# Patient Record
Sex: Male | Born: 1970 | Race: Black or African American | Hispanic: No | Marital: Single | Smoking: Never smoker
Health system: Southern US, Community
[De-identification: ages and names within clinical notes are randomized; demographics above are authoritative.]

## PROBLEM LIST (undated history)

## (undated) DIAGNOSIS — G4733 Obstructive sleep apnea (adult) (pediatric): Secondary | ICD-10-CM

## (undated) DIAGNOSIS — E785 Hyperlipidemia, unspecified: Secondary | ICD-10-CM

## (undated) DIAGNOSIS — I10 Essential (primary) hypertension: Secondary | ICD-10-CM

## (undated) DIAGNOSIS — G4730 Sleep apnea, unspecified: Secondary | ICD-10-CM

## (undated) DIAGNOSIS — E668 Other obesity: Secondary | ICD-10-CM

## (undated) DIAGNOSIS — M51369 Other intervertebral disc degeneration, lumbar region without mention of lumbar back pain or lower extremity pain: Secondary | ICD-10-CM

## (undated) DIAGNOSIS — E669 Obesity, unspecified: Secondary | ICD-10-CM

## (undated) DIAGNOSIS — E6689 Other obesity not elsewhere classified: Secondary | ICD-10-CM

## (undated) DIAGNOSIS — F4310 Post-traumatic stress disorder, unspecified: Secondary | ICD-10-CM

## (undated) DIAGNOSIS — F329 Major depressive disorder, single episode, unspecified: Secondary | ICD-10-CM

## (undated) DIAGNOSIS — M5136 Other intervertebral disc degeneration, lumbar region: Secondary | ICD-10-CM

## (undated) DIAGNOSIS — E119 Type 2 diabetes mellitus without complications: Secondary | ICD-10-CM

## (undated) DIAGNOSIS — R7303 Prediabetes: Secondary | ICD-10-CM

## (undated) DIAGNOSIS — F32A Depression, unspecified: Secondary | ICD-10-CM

## (undated) HISTORY — DX: Essential (primary) hypertension: I10

## (undated) HISTORY — DX: Other obesity not elsewhere classified: E66.89

## (undated) HISTORY — DX: Prediabetes: R73.03

## (undated) HISTORY — DX: Other intervertebral disc degeneration, lumbar region without mention of lumbar back pain or lower extremity pain: M51.369

## (undated) HISTORY — DX: Hyperlipidemia, unspecified: E78.5

## (undated) HISTORY — DX: Sleep apnea, unspecified: G47.30

## (undated) HISTORY — DX: Other obesity: E66.8

## (undated) HISTORY — DX: Obstructive sleep apnea (adult) (pediatric): G47.33

## (undated) HISTORY — DX: Major depressive disorder, single episode, unspecified: F32.9

## (undated) HISTORY — DX: Post-traumatic stress disorder, unspecified: F43.10

## (undated) HISTORY — DX: Other intervertebral disc degeneration, lumbar region: M51.36

## (undated) HISTORY — DX: Obesity, unspecified: E66.9

## (undated) HISTORY — DX: Type 2 diabetes mellitus without complications: E11.9

## (undated) HISTORY — DX: Depression, unspecified: F32.A

---

## 2012-11-30 ENCOUNTER — Ambulatory Visit: Payer: Self-pay | Admitting: Dietician

## 2013-01-15 ENCOUNTER — Encounter: Payer: BC Managed Care – HMO | Attending: Family Medicine | Admitting: *Deleted

## 2013-01-15 ENCOUNTER — Encounter: Payer: Self-pay | Admitting: *Deleted

## 2013-01-15 DIAGNOSIS — E669 Obesity, unspecified: Secondary | ICD-10-CM | POA: Insufficient documentation

## 2013-01-15 DIAGNOSIS — Z713 Dietary counseling and surveillance: Secondary | ICD-10-CM | POA: Insufficient documentation

## 2013-01-15 NOTE — Progress Notes (Signed)
Medical Nutrition Therapy:  Appt start time: 0800 end time:  0900.  Assessment:  Primary concern today: Weight management. Patient with obstructive sleep apnea. He reports that he gained about 20 pounds in the last 5 months. This is mainly due to eating a lot of fast food during the work day and excessive snacking on junk food available at work. 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 He often doesn't have a chance to sit down for lunch during the day. He started a 30 day "cleanse", which involves cutting out starches, sugars, dairy, butter, and fried foods. He has also eliminated soda and sweet tea. He reports a weight loss of 17 pounds in the last 2 months. He now brings about 4-5 servings of fruit to work daily, which he snacks on throughout the day. Protein intake appears low. He is also exercising regularly on his off days.   MEDICATIONS: Simvastati   DIETARY INTAKE:   Usual eating pattern includes 2-3 meals and multiple snacks per day.  24-hr recall:  B ( AM): Fruit, egg casserole (vegetables)  Snk ( AM): Fruit, Lara bars  L ( PM): Sometimes if time, Chicken breast, vegetables, fruit Snk ( PM): Same D ( PM): Baked chicken, vegetables Snk ( PM): None Beverages: Water, unsweetened tea  Usual physical activity: Treadmill 4 miles, several times weekly, active job  Estimated energy needs: 1800 calories 225 g carbohydrates 113 g protein 50 g fat  Progress Towards Goal(s):  In progress.   Nutritional Diagnosis:  [place]-3.3 Overweight/obesity As related to history of excessive energy intake and physical inactivity.  As evidenced by BMI 45.1.    Intervention:  Nutrition counseling. We discussed strategies for weight loss, including balancing nutrients (carbs, protein, fat), portion control, healthy snacks, and exercise.   Goals:  1. 1 pound weight loss per week. No goal weight set at this time. 2. Balance nutrients at meals, including a good protein source at  least 3 times daily, non-starchy vegetables, and 1-2 serving of healthy starches at meals.  3. Monitor portion size of foods.  4. Continue walking on the treadmill at least 3 days weekly. Incorporate resistance training 2-3 days weekly.   Handouts given during visit include:  Weight loss tips  Yellow meal plan card  Monitoring/Evaluation:  Dietary intake, exercise, and body weight prn.

## 2013-11-08 ENCOUNTER — Encounter: Payer: Self-pay | Admitting: Emergency Medicine

## 2013-11-08 ENCOUNTER — Emergency Department
Admission: EM | Admit: 2013-11-08 | Discharge: 2013-11-08 | Disposition: A | Discharge status: Home / Self Care | Payer: BC Managed Care – PPO | Source: Home / Self Care | Attending: Family Medicine | Admitting: Family Medicine

## 2013-11-08 DIAGNOSIS — T63453A Toxic effect of venom of hornets, assault, initial encounter: Principal | ICD-10-CM

## 2013-11-08 DIAGNOSIS — T63463A Toxic effect of venom of wasps, assault, initial encounter: Principal | ICD-10-CM

## 2013-11-08 DIAGNOSIS — E1169 Type 2 diabetes mellitus with other specified complication: Secondary | ICD-10-CM | POA: Insufficient documentation

## 2013-11-08 DIAGNOSIS — E785 Hyperlipidemia, unspecified: Secondary | ICD-10-CM | POA: Insufficient documentation

## 2013-11-08 DIAGNOSIS — T6391XA Toxic effect of contact with unspecified venomous animal, accidental (unintentional), initial encounter: Secondary | ICD-10-CM

## 2013-11-08 DIAGNOSIS — T63443A Toxic effect of venom of bees, assault, initial encounter: Secondary | ICD-10-CM

## 2013-11-08 MED ORDER — PREDNISONE 20 MG PO TABS: 20.0000 mg | ORAL_TABLET | Freq: Two times a day (BID) | ORAL | Status: DC

## 2013-11-08 NOTE — ED Notes (Signed)
Christopher [name] was mowing his lawn about a hour ago when he was stung 3 times by yellow jackets on his right leg and left ankle. The area is red, swollen and painful. Denies shortness of breath or swelling in throat.

## 2013-11-08 NOTE — Discharge Instructions (Signed)
Apply cool compress several times daily until resolved.  Take Benadryl 8m every 6hours for one to two days.   Return for worsening symptoms:  Increased pain, swelling, heat, redness, etc.   Bee, Wasp, or Hornet Sting Your caregiver has diagnosed you as having an insect sting. An insect sting appears as a red lump in the skin that sometimes has a tiny hole in the center, or it may have a stinger in the center of the wound. The most common stings are from wasps, hornets and bees. Individuals have different reactions to insect stings.  A normal reaction may cause pain, swelling, and redness around the sting site.  A localized allergic reaction may cause swelling and redness that extends beyond the sting site.  A large local reaction may continue to develop over the next 12 to 36 hours.  On occasion, the reactions can be severe (anaphylactic reaction). An anaphylactic reaction may cause wheezing; difficulty breathing; chest pain; fainting; raised, itchy, red patches on the skin; a sick feeling to your stomach (nausea); vomiting; cramping; or diarrhea. If you have had an anaphylactic reaction to an insect sting in the past, you are more likely to have one again. HOME CARE INSTRUCTIONS   With bee stings, a small sac of poison is left in the wound. Brushing across this with something such as a credit card, or anything similar, will help remove this and decrease the amount of the reaction. This same procedure will not help a wasp sting as they do not leave behind a stinger and poison sac.  Apply a cold compress for 10 to 20 minutes every hour for 1 to 2 days, depending on severity, to reduce swelling and itching.  To lessen pain, a paste made of water and baking soda may be rubbed on the bite or sting and left on for 5 minutes.  To relieve itching and swelling, you may use take medication or apply medicated creams or lotions as directed.  Only take over-the-counter or prescription medicines for  pain, discomfort, or fever as directed by your caregiver.  Wash the sting site daily with soap and water. Apply antibiotic ointment on the sting site as directed.  If you suffered a severe reaction:  If you did not require hospitalization, an adult will need to stay with you for 24 hours in case the symptoms return.  You may need to wear a medical bracelet or necklace stating the allergy.  You and your family need to learn when and how to use an anaphylaxis kit or epinephrine injection.  If you have had a severe reaction before, always carry your anaphylaxis kit with you. SEEK MEDICAL CARE IF:   None of the above helps within 2 to 3 days.  The area becomes red, warm, tender, and swollen beyond the area of the bite or sting.  You have an oral temperature above 102 F (38.9 C). SEEK IMMEDIATE MEDICAL CARE IF:  You have symptoms of an allergic reaction which are:  Wheezing.  Difficulty breathing.  Chest pain.  Lightheadedness or fainting.  Itchy, raised, red patches on the skin.  Nausea, vomiting, cramping or diarrhea. ANY OF THESE SYMPTOMS MAY REPRESENT A SERIOUS PROBLEM THAT IS AN EMERGENCY. Do not wait to see if the symptoms will go away. Get medical help right away. Call your local emergency services (911 in [place] DO NOT drive yourself to the hospital. MAKE SURE YOU:   Understand these instructions.  Will watch your condition.  Will get help right  away if you are not doing well or get worse. Document Released: 04/22/2005 Document Revised: 07/15/2011 Document Reviewed: 10/07/2009 [facility] Patient Information 2015 [place], [place]. This information is not intended to replace advice given to you by your health care provider. Make sure you discuss any questions you have with your health care provider.

## 2013-11-08 NOTE — ED Provider Notes (Signed)
CSN: [id number]     Arrival date & time 11/08/13  1820 History   First MD Initiated Contact with Patient 11/08/13 1856     Chief Complaint  Patient presents with  . Insect Bite      HPI Comments: Patient was mowing his lawn about an hour ago when 3 times by yellow jackets on his right leg and left ankle.  He took 50mg  Benadryl PO.  He denies swelling in tongue/throat, shortness of breath, wheezing, or hives. He states that usually when stung by bees he develops hives.   Patient is a 43 y.o. male presenting with trauma. The history is provided by the patient.  Trauma Mechanism of injury: 3 yellow jack stings Injury location: lower legs. Incident location: home Time since incident: 1 hour Arrived directly from scene: yes   EMS/PTA data:      Loss of consciousness: no  Current symptoms:      Pain quality: burning      Pain timing: constant      Associated symptoms:            Denies abdominal pain, chest pain, difficulty breathing, headache, loss of consciousness, nausea, seizures and vomiting.   Relevant PMH:      Medical risk factors:            No asthma.       Tetanus status: UTD   Past Medical History  Diagnosis Date  . Hyperlipidemia    History reviewed. No pertinent past surgical history. History reviewed. No pertinent family history. History  Substance Use Topics  . Smoking status: Never Smoker   . Smokeless tobacco: Never Used  . Alcohol Use: No    Review of Systems  Cardiovascular: Negative for chest pain.  Gastrointestinal: Negative for nausea, vomiting and abdominal pain.  Neurological: Negative for seizures, loss of consciousness and headaches.  All other systems reviewed and are negative.   Allergies  Review of patient's allergies indicates no known allergies.  Home Medications   Prior to Admission medications   Medication Sig Start Date End Date Taking? Authorizing Provider  simvastatin (ZOCOR) 10 MG tablet Take 10 mg by mouth daily.   Yes  Historical Provider, MD  predniSONE (DELTASONE) 20 MG tablet Take 1 tablet (20 mg total) by mouth 2 (two) times daily. Take with food. 11/08/13   Kandra Nicolas, MD   BP 139/88  Pulse 96  Temp(Src) 98.2 F (36.8 C) (Oral)  Resp 18  Ht 5\' 8"  (1.727 m)  Wt 307 lb (139.254 kg)  BMI 46.69 kg/m2  SpO2 97% Physical Exam  Nursing note and vitals reviewed. Constitutional: He is oriented to person, place, and time. He appears well-developed and well-nourished. No distress.  Patient is obese (BMI 46.7)  HENT:  Head: Atraumatic.  Nose: Nose normal.  Mouth/Throat: Oropharynx is clear and moist.  Eyes: Conjunctivae and EOM are normal. Pupils are equal, round, and reactive to light.  Neck: Neck supple.  Cardiovascular: Normal heart sounds.   Pulmonary/Chest: Breath sounds normal. No stridor.  Abdominal: There is no tenderness.  Lymphadenopathy:    He has no cervical adenopathy.  Neurological: He is alert and oriented to person, place, and time.  Skin: Skin is warm and dry. No rash noted.     Mild tenderness to areas marked in blue on lower extremities.  No erythema or warmth.  Minimal swelling.    ED Course  Procedures  NONE     MDM   1. Sting from  hornet, wasp, or bee, assault, initial encounter    Begin prednisone burst. Apply cool compress several times daily until resolved.  Take Benadryl 50mg  every 6hours for one to two days.   Return for worsening symptoms:  Increased pain, swelling, heat, redness, etc. Followup with Family Doctor if not improved in about 4 days. If symptoms become significantly worse during the night or over the weekend, proceed to the local emergency room.     Kandra Nicolas, MD 11/09/13 [id number]

## 2013-11-09 ENCOUNTER — Encounter: Payer: Self-pay | Admitting: *Deleted

## 2013-11-12 ENCOUNTER — Telehealth: Payer: Self-pay | Admitting: Emergency Medicine

## 2013-11-12 NOTE — ED Notes (Signed)
Inquired about patient's status; encourage them to call with questions/concerns.  

## 2013-12-02 ENCOUNTER — Encounter: Payer: Self-pay | Admitting: Family Medicine

## 2013-12-02 ENCOUNTER — Ambulatory Visit (INDEPENDENT_AMBULATORY_CARE_PROVIDER_SITE_OTHER): Payer: BC Managed Care – PPO | Admitting: Family Medicine

## 2013-12-02 VITALS — BP 129/77 | HR 83 | Ht 67.75 in | Wt 307.0 lb

## 2013-12-02 DIAGNOSIS — G4733 Obstructive sleep apnea (adult) (pediatric): Secondary | ICD-10-CM | POA: Insufficient documentation

## 2013-12-02 DIAGNOSIS — E785 Hyperlipidemia, unspecified: Secondary | ICD-10-CM

## 2013-12-02 DIAGNOSIS — Z9989 Dependence on other enabling machines and devices: Secondary | ICD-10-CM | POA: Insufficient documentation

## 2013-12-02 DIAGNOSIS — E669 Obesity, unspecified: Secondary | ICD-10-CM | POA: Diagnosis not present

## 2013-12-02 DIAGNOSIS — Z8042 Family history of malignant neoplasm of prostate: Secondary | ICD-10-CM | POA: Insufficient documentation

## 2013-12-02 HISTORY — DX: Obstructive sleep apnea (adult) (pediatric): G47.33

## 2013-12-02 NOTE — Progress Notes (Signed)
CC: Christopher [name] is a 43 y.o. male is here for Establish Care   Subjective: HPI:  Very pleasant 43 year old here to establish care  Patient reports a history of hyperlipidemia he's been dealing with for years, been taking Lipitor on a daily basis without known side effects for a little over a year now. He believes that his cholesterol was checked in April and was reportedly normal. No formal exercise routine, he admits difficulty with sticking to a healthy diet. No history of diabetes, nor cardiovascular disease  Person history of obesity that has been worsening over the past, described as moderate in severity. Reports at least 1 pound weight gain every week for matter of months now. She was worsened by activity typically leading to boredom this resolved with eating it into his weight gain. Reports irregular work schedule makes it hard to get into routine formal exercise plan. He's benefit from nutrition referral in the past he wants noted to reestablish with them.  Reports a history of obstructive sleep apnea currently using CPAP machine on nightly basis without fatigue or nonrestorative sleep  Review of Systems - General ROS: negative for - chills, fever, night sweats,  weight loss Ophthalmic ROS: negative for - decreased vision Psychological ROS: negative for - anxiety or depression ENT ROS: negative for - hearing change, nasal congestion, tinnitus or allergies Hematological and Lymphatic ROS: negative for - bleeding problems, bruising or swollen lymph nodes Breast ROS: negative Respiratory ROS: no cough, shortness of breath, or wheezing Cardiovascular ROS: no chest pain or dyspnea on exertion Gastrointestinal ROS: no abdominal pain, change in bowel habits, or black or bloody stools Genito-Urinary ROS: negative for - genital discharge, genital ulcers, incontinence or abnormal bleeding from genitals Musculoskeletal ROS: negative for - joint pain or muscle pain Neurological ROS: negative  for - headaches or memory loss Dermatological ROS: negative for lumps, mole changes, rash and skin lesion changes  Past Medical History  Diagnosis Date  . Hyperlipidemia     History reviewed. No pertinent past surgical history. Family History  Problem Relation Age of Onset  . Prostate cancer      History   Social History  . Marital Status: Single    Spouse Name: N/A    Number of Children: N/A  . Years of Education: N/A   Occupational History  . Not on file.   Social History Main Topics  . Smoking status: Never Smoker   . Smokeless tobacco: Never Used  . Alcohol Use: No  . Drug Use: No  . Sexual Activity: Yes    Partners: Female   Other Topics Concern  . Not on file   Social History Narrative   ** Merged History Encounter **         Objective: BP 129/77  Pulse 83  Ht 5' 7.75" (1.721 m)  Wt 307 lb (139.254 kg)  BMI 47.02 kg/m2  General: Alert and Oriented, No Acute Distress HEENT: Pupils equal, round, reactive to light. Conjunctivae clear.   moist mucous membranes pharynx unremarkable  Lungs: Clear to auscultation bilaterally, no wheezing/ronchi/rales.  Comfortable work of breathing. Good air movement. Cardiac: Regular rate and rhythm. Normal S1/S2.  No murmurs, rubs, nor gallops.  No carotid bruit  Abdomen:  obese and soft  Extremities: No peripheral edema.  Strong peripheral pulses.  Mental Status: No depression, anxiety, nor agitation. Skin: Warm and dry.  Assessment & Plan: Takota was seen today for establish care.  Diagnoses and associated orders for this visit:  OSA (  obstructive sleep apnea)  Obesity - Ambulatory referral to diabetic education  Hyperlipidemia    Hyperlipidemia: I requested outside records in order to review his most recent lipid panel and looking to see if liver enzymes were checked. Continue Lipitor pending my review. Obesity: Uncontrolled referral has been placed for nutrition, discussed using my finger spelled help with  calorie counting.This can be improved with engaging in 30-45 minutes of moderate exercise most days of the week. Obstructive sleep apnea: Clinically controlled    Return in about 3 months (around 03/04/2014).

## 2014-01-13 ENCOUNTER — Ambulatory Visit: Payer: Self-pay | Admitting: Dietician

## 2014-01-28 ENCOUNTER — Encounter: Payer: Self-pay | Admitting: Dietician

## 2014-01-28 ENCOUNTER — Encounter: Payer: BC Managed Care – PPO | Attending: Family Medicine | Admitting: Dietician

## 2014-01-28 VITALS — Ht 68.0 in | Wt 305.3 lb

## 2014-01-28 DIAGNOSIS — E669 Obesity, unspecified: Secondary | ICD-10-CM | POA: Diagnosis present

## 2014-01-28 DIAGNOSIS — Z713 Dietary counseling and surveillance: Secondary | ICD-10-CM | POA: Diagnosis not present

## 2014-01-28 DIAGNOSIS — Z6841 Body Mass Index (BMI) 40.0 and over, adult: Secondary | ICD-10-CM | POA: Insufficient documentation

## 2014-01-28 NOTE — Progress Notes (Signed)
Medical Nutrition Therapy:  Appt start time: 0915 end time:  3500.   Assessment:  Primary concerns today: Christopher [name] was referred today for weight management. He reports minor weight loss since his last doctor's visit. He is wanting to go on a weight loss medication.  He has been trying to eat more vegetables.  His work schedule is either 2 days on 2 days off or 5 days on 5 days off for 12 hour rotating shifts (1 month on days then 1 month on nights).  Patient lives with his son and girlfriend and states that his girlfriend does the food shopping and cooking.  His eating varies depending on work, if he is at work he tries to eat every 3-4 hours. He does not eat as consistently on days off.  They do not fry much food at home and he reports eating fried foods less than 1x/week.  They go out 2 times a month to get [place] food or [employer] or [place].  His long-term weight goal is 170-180 lbs.  Patient reports he has been trying to follow a low-carb diet recently by cutting back on consumption of starches and sodas.  Wt Readings from Last 3 Encounters:  01/28/14 305 lb 4.8 oz (138.483 kg)  12/02/13 307 lb (139.254 kg)  11/08/13 307 lb (139.254 kg)   Learning Readiness:  Contemplating  Ready  MEDICATIONS: see list   DIETARY INTAKE:  Usual eating pattern includes 3 meals and 2 snacks per day.  24-hr recall:  B ( AM): couple of boiled eggs, omelet or oatmeal if off work  Snk ( AM): fruit like grapes or pears L ( PM): takes lunch of [place] bars, fruit, or leftovers from night before , late lunch if off work Snk ( PM): fruit likes grapes or pears D ( PM): pulled pork barbeque, rotisserie chicken, spaghetti, and normally a veggie like broccolli Snk ( PM): none Beverages: Pepsi throughout the day, unsweet tea, water   Usual physical activity: Walk in his neighborhood on his days off for 30 min or more.  Progress Towards Goal(s):  In progress.   Nutritional  Diagnosis:  [place]-3.3 Overweight/obesity As related to disordered eating/sleeping schedules with rotating work shifts and limited physical activity outside of work.  As evidenced by patient report and BMI of 46.5.    Intervention:  Nutrition counseling for weight management.  Discussed aspects of building a healthy, balanced meal utilizing MyPlate portion method.  Stated that limiting carbohydrate portions can be a good strategy for weight loss.  Recommended he choose high-fiber carbohydrates such as fruit, beans, and whole grains.  Provided and reviewed list of carbohydrate containing foods.  Recommend he choose around 3 servings of carb at each meal (about 45 grams per meal).  Discussed healthy fat options and recommended limiting saturated fat.  Stated he should choose lean protein and dairy options.  Discussed appropriate beverage options to reduce caloric drinks.  Patient was agreeable to plan and goals were set to be realistic and attainable for him.  Together we set the following goals:   Limit regular sodas to twice a week.  Choose diet sodas, unsweet tea, or water the rest of the time. Choose G2 or Powerade Zero. Begin regular exercise.  Aim for 3 days every week for 45 min.  Try walking or jogging. If you can't be outdoors try walking in the mall or [employer] or doing a home exercise tape. Continue to eat at regular times throughout  the day. Continue to make 1/2 of your plate vegetables.  Aim for variety and color on your plate. Use smaller plates and bowls at home.  Continue to portion snacks into containers. Try lower fat cheese sticks (part-skim mozzeralla) or 10-15 nuts with snacks to add in additional protein.  Teaching Method Utilized all of the following: Visual Auditory Hands on  Handouts given during visit include:  MyPlate portion method  Yellow Card Meal Planner  Low Carb Snack Suggestions  Barriers to learning/adherence to lifestyle change: work schedule  Demonstrated  degree of understanding via:  Teach Back   Monitoring/Evaluation:  Dietary intake, exercise, and body weight in 3 month(s).

## 2014-01-28 NOTE — Patient Instructions (Addendum)
Limit regular sodas to twice a week.  Choose diet sodas, unsweet tea, or water the rest of the time. Choose G2 or Powerade Zero. Begin regular exercise.  Aim for 3 days every week for 45 min.  Try walking or jogging. If you can't be outdoors try walking in the mall or [employer] or doing a home exercise tape. Continue to eat at regular times throughout the day. Continue to make 1/2 of your plate vegetables.  Aim for variety and color on your plate. Use smaller plates and bowls at home.  Continue to portion snacks into containers. Try lower fat cheese sticks (part-skim mozzeralla) or 10-15 nuts with snacks to add in additional protein.

## 2014-03-08 ENCOUNTER — Emergency Department
Admission: EM | Admit: 2014-03-08 | Discharge: 2014-03-08 | Disposition: A | Discharge status: Home / Self Care | Payer: BC Managed Care – PPO | Source: Home / Self Care | Attending: Emergency Medicine | Admitting: Emergency Medicine

## 2014-03-08 ENCOUNTER — Emergency Department (INDEPENDENT_AMBULATORY_CARE_PROVIDER_SITE_OTHER): Payer: BC Managed Care – PPO

## 2014-03-08 ENCOUNTER — Encounter: Payer: Self-pay | Admitting: *Deleted

## 2014-03-08 DIAGNOSIS — M79671 Pain in right foot: Secondary | ICD-10-CM

## 2014-03-08 DIAGNOSIS — M79604 Pain in right leg: Secondary | ICD-10-CM

## 2014-03-08 MED ORDER — INDOMETHACIN 25 MG PO CAPS: 25.0000 mg | ORAL_CAPSULE | Freq: Three times a day (TID) | ORAL | Status: DC

## 2014-03-08 NOTE — ED Notes (Signed)
Pt c/o RT foot pain x 1wk. Denies injury. No OTC meds.

## 2014-03-08 NOTE — ED Provider Notes (Signed)
CSN: [id number]     Arrival date & time 03/08/14  1920 History   First MD Initiated Contact with Patient 03/08/14 1927     Chief Complaint  Patient presents with  . Foot Pain   (Consider location/radiation/quality/duration/timing/severity/associated sxs/prior Treatment) HPI Right foot pain for last week.   He works at [employer] and wears supportive steel toe shoes.  No history in himself or family of gout.  No trauma, twisting, falling.  Denies any recent injury.  No previous injuries to this foot.  Has not taken any medicines or modalities for this.  Pain with walking, better with rest.  Past Medical History  Diagnosis Date  . Hyperlipidemia    History reviewed. No pertinent past surgical history. Family History  Problem Relation Age of Onset  . Prostate cancer     History  Substance Use Topics  . Smoking status: Never Smoker   . Smokeless tobacco: Never Used  . Alcohol Use: No    Review of Systems  All other systems reviewed and are negative.   Allergies  Review of patient's allergies indicates no known allergies.  Home Medications   Prior to Admission medications   Medication Sig Start Date End Date Taking? Authorizing Provider  indomethacin (INDOCIN) 25 MG capsule Take 1 capsule (25 mg total) by mouth 3 (three) times daily with meals. 03/08/14   [name], MD  simvastatin (ZOCOR) 10 MG tablet Take 10 mg by mouth at bedtime.    Historical Provider, MD   BP 129/85 mmHg  Pulse 69  Temp(Src) 98.5 F (36.9 C) (Oral)  Resp 16  Ht 5\' 8"  (1.727 m)  Wt 302 lb (136.986 kg)  BMI 45.93 kg/m2  SpO2 96% Physical Exam  Constitutional: He is oriented to person, place, and time. He appears well-developed and well-nourished.  HENT:  Head: Normocephalic and atraumatic.  Eyes: No scleral icterus.  Neck: Neck supple.  Cardiovascular: Regular rhythm and normal heart sounds.   Pulmonary/Chest: Effort normal and breath sounds normal. No respiratory  distress.  Musculoskeletal:  Right ankle/foot: FROM, +TTP shafts of 2nd thru 4th metatarsals.   No TTP medial/lateral malleolus, navicular, base of 5th, calcaneus, Achilles, proximal fibula.  No tenderness at first MTP.  Axial loading is not painful.  Metatarsal squeezing is not painful.  Mild localized swelling. No ecchymoses.  Distal neurovascular status is intact. Gait is slightly antalgic.   Neurological: He is alert and oriented to person, place, and time.  Skin: Skin is warm and dry.  Psychiatric: He has a normal mood and affect. His speech is normal.  Nursing note and vitals reviewed.   ED Course  Procedures (including critical care time) Labs Review Labs Reviewed - No data to display  Imaging Review No results found.   MDM   1. Right foot pain     An x-ray was taken and read by the radiologist as above.  Metatarsalgia / foot sprain versus gout flare, however does not have a history of gout.  Will place him on indomethacin for a few days.  Ice, elevation, rest.  Follow-up with his PCP or Dr. Darene Lamer in about a week.  Small lucency that I can see on x-ray through the lateral shaft cortex of the third metatarsal may be evaluated by ultrasound by Dr. Darene Lamer.  This is most likely is a nutrient foramen, however this is adjacent to his pain.  [name], MD 03/08/14 2003

## 2014-03-11 ENCOUNTER — Ambulatory Visit: Payer: Self-pay | Admitting: Family Medicine

## 2014-03-17 ENCOUNTER — Encounter: Payer: Self-pay | Admitting: Family Medicine

## 2014-03-17 ENCOUNTER — Ambulatory Visit (INDEPENDENT_AMBULATORY_CARE_PROVIDER_SITE_OTHER): Payer: BC Managed Care – PPO | Admitting: Family Medicine

## 2014-03-17 VITALS — BP 114/74 | HR 80 | Wt 297.0 lb

## 2014-03-17 DIAGNOSIS — J029 Acute pharyngitis, unspecified: Secondary | ICD-10-CM

## 2014-03-17 DIAGNOSIS — M79671 Pain in right foot: Secondary | ICD-10-CM | POA: Diagnosis not present

## 2014-03-17 MED ORDER — INDOMETHACIN 25 MG PO CAPS: 25.0000 mg | ORAL_CAPSULE | Freq: Three times a day (TID) | ORAL | Status: DC

## 2014-03-17 NOTE — Progress Notes (Signed)
CC: Christopher [name] is a 43 y.o. male is here for right ankle injury   Subjective: HPI:  Complaint right foot pain that began 2 weeks ago localized on the dorsal surface, medially in the midfoot. It is worse with inversion with walking long distances. It is improved with taking indomethacin but only for a few hours. He has been using rest ice and elevation which he takes is also helping. He tells me that yesterday pain was completely absent for the majority of the day but returned after returning home from shopping all day long. Symptoms are also improved when wearing steel toed boots. Worse when wearing sneakers. He initially had swelling of the site of pain but this is now resolved. He denies pain elsewhere in the foot. Currently symptoms are described as mild in severity.he describes the pain only as pain He also complains of sore throat that began last night and is mild in severity. Worse with swallowing, nothing particularly makes better or worse other than above. Denies fevers, chills, cough, nasal congestion, no difficulty swallowing   Review Of Systems Outlined In HPI  Past Medical History  Diagnosis Date  . Hyperlipidemia     No past surgical history on file. Family History  Problem Relation Age of Onset  . Prostate cancer      History   Social History  . Marital Status: Single    Spouse Name: N/A    Number of Children: N/A  . Years of Education: N/A   Occupational History  . Not on file.   Social History Main Topics  . Smoking status: Never Smoker   . Smokeless tobacco: Never Used  . Alcohol Use: No  . Drug Use: No  . Sexual Activity:    Partners: Female   Other Topics Concern  . Not on file   Social History Narrative   ** Merged History Encounter **         Objective: BP 114/74 mmHg  Pulse 80  Wt 297 lb (134.718 kg)  General: Alert and Oriented, No Acute Distress HEENT: Pupils equal, round, reactive to light. Conjunctivae clear.  External ears  unremarkable, canals clear with intact TMs with appropriate landmarks.  Middle ear appears open without effusion. Pink inferior turbinates.  Moist mucous membranes, pharynx without inflammation nor lesions.  Neck supple without palpable lymphadenopathy nor abnormal masses. Lungs: Clear to auscultation bilaterally, no wheezing/ronchi/rales.  Comfortable work of breathing. Good air movement. Extremities: No peripheral edema.  Strong peripheral pulses. Exam of the right foot shows no pain with palpation of the medial or lateral malleoli, no pain at the base of the fifth metatarsal, no pain with compression of the forefoot, no pain at the inferior posterior aspect of the calcaneus with compression. Anterior drawer is negative. Plantar dorsal deviation of the heads of the metatarsals does not reproduce any pain. Pain is reproduced with palpation of the dorsiflexing tendons in the forefoot. No overlying skin changes or swelling of the site of discomfort. Mental Status: No depression, anxiety, nor agitation. Skin: Warm and dry.  Assessment & Plan: Christopher [name] was seen today for right ankle injury.  Diagnoses and associated orders for this visit:  Right foot pain - indomethacin (INDOCIN) 25 MG capsule; Take 1 capsule (25 mg total) by mouth 3 (three) times daily with meals.  Sore throat    Right foot pain: Suspect ligamentous tearing and tendinitis. Offered a foot immobilizer initially however he preferred to stick with just wearing his work boots with almost no  flex of the sole. He later changed his mind and will return sometime today for a Cam Walker to be worn on a daily basis during all waking hours for the next 2 weeks Sore throat: No sign of bacterial involvement, discussed that if he has a fever or significant worsening tomorrow he can call in for penicillin otherwise treat with warm beverages and the indomethacin here he has for his foot pain.  Return if symptoms worsen or fail to improve.

## 2014-04-21 ENCOUNTER — Emergency Department
Admission: EM | Admit: 2014-04-21 | Discharge: 2014-04-21 | Disposition: A | Discharge status: Home / Self Care | Payer: BC Managed Care – PPO | Source: Home / Self Care | Attending: Emergency Medicine | Admitting: Emergency Medicine

## 2014-04-21 ENCOUNTER — Encounter: Payer: Self-pay | Admitting: *Deleted

## 2014-04-21 DIAGNOSIS — J0121 Acute recurrent ethmoidal sinusitis: Secondary | ICD-10-CM

## 2014-04-21 MED ORDER — AMOXICILLIN 500 MG PO CAPS: 500.0000 mg | ORAL_CAPSULE | Freq: Three times a day (TID) | ORAL | Status: DC

## 2014-04-21 NOTE — ED Notes (Signed)
Pt c/o HA, nausea, dizziness, nasal congestion and sinus pain x 4 days. Denies fever.

## 2014-04-21 NOTE — Discharge Instructions (Signed)

## 2014-04-21 NOTE — ED Provider Notes (Signed)
CSN: [id number]     Arrival date & time 04/21/14  1545 History   First MD Initiated Contact with Patient 04/21/14 1708     Chief Complaint  Patient presents with  . Headache  . Nasal Congestion  . Dizziness   (Consider location/radiation/quality/duration/timing/severity/associated sxs/prior Treatment) Patient is a 43 y.o. male presenting with headaches and dizziness. The history is provided by the patient. No language interpreter was used.  Headache Pain location:  Generalized Quality:  Dull Radiates to:  Does not radiate Timing:  Constant Progression:  Worsening Similar to prior headaches: no   Relieved by:  Nothing Worsened by:  Nothing tried Ineffective treatments:  None tried Associated symptoms: dizziness   Risk factors: no anger   Dizziness Associated symptoms: headaches   Pt complains of sinus pressure and congestion.  Pt reports symptoms for the past 4 days  Past Medical History  Diagnosis Date  . Hyperlipidemia    History reviewed. No pertinent past surgical history. Family History  Problem Relation Age of Onset  . Prostate cancer     History  Substance Use Topics  . Smoking status: Never Smoker   . Smokeless tobacco: Never Used  . Alcohol Use: No    Review of Systems  Neurological: Positive for dizziness and headaches.  All other systems reviewed and are negative.   Allergies  Review of patient's allergies indicates no known allergies.  Home Medications   Prior to Admission medications   Medication Sig Start Date End Date Taking? Authorizing Provider  amoxicillin (AMOXIL) 500 MG capsule Take 1 capsule (500 mg total) by mouth 3 (three) times daily. 04/21/14   Fransico Meadow, PA-C  indomethacin (INDOCIN) 25 MG capsule Take 1 capsule (25 mg total) by mouth 3 (three) times daily with meals. 03/17/14   Sean Hommel, DO  simvastatin (ZOCOR) 10 MG tablet Take 10 mg by mouth at bedtime.    Historical Provider, MD   BP 126/87 mmHg  Pulse 89  Temp(Src)  97.9 F (36.6 C) (Oral)  Resp 18  Ht 5\' 8"  (1.727 m)  Wt 303 lb (137.44 kg)  BMI 46.08 kg/m2  SpO2 96% Physical Exam  Constitutional: He is oriented to person, place, and time. He appears well-developed and well-nourished.  HENT:  Head: Normocephalic.  Right Ear: External ear normal.  Left Ear: External ear normal.  Nose: Nose normal.  Tender maxillary sinuses  Eyes: EOM are normal. Pupils are equal, round, and reactive to light.  Neck: Normal range of motion.  Cardiovascular: Normal rate.   Pulmonary/Chest: Effort normal.  Abdominal: He exhibits no distension.  Musculoskeletal: Normal range of motion.  Neurological: He is alert and oriented to person, place, and time.  Skin: Skin is warm.  Psychiatric: He has a normal mood and affect.  Nursing note and vitals reviewed.   ED Course  Procedures (including critical care time) Labs Review Labs Reviewed - No data to display  Imaging Review No results found.   MDM   1. Acute recurrent ethmoidal sinusitis    Amoxicillian AVS    Fransico Meadow, PA-C 04/21/14 1742

## 2014-05-13 ENCOUNTER — Encounter: Payer: BLUE CROSS/BLUE SHIELD | Attending: Family Medicine | Admitting: Dietician

## 2014-05-13 VITALS — Ht 68.0 in | Wt 302.9 lb

## 2014-05-13 DIAGNOSIS — Z6841 Body Mass Index (BMI) 40.0 and over, adult: Secondary | ICD-10-CM | POA: Insufficient documentation

## 2014-05-13 DIAGNOSIS — Z713 Dietary counseling and surveillance: Secondary | ICD-10-CM | POA: Insufficient documentation

## 2014-05-13 DIAGNOSIS — E669 Obesity, unspecified: Secondary | ICD-10-CM | POA: Diagnosis not present

## 2014-05-13 NOTE — Progress Notes (Signed)
Medical Nutrition Therapy:  Appt start time: 1030 end time:  1130.   Assessment:  Primary concerns today: Christopher [name] is here for f/u for weight management.  States his low carb diet was working for him but then he ran into multiple barriers including: Not getting support from his significant other who does food shopping and cooking,  reports feeling "down" with death of friend, and pressure he puts on himself to get healthy so that he doesn't have an early death, thinking back to how fit he used to be.  Fractured his ankle and could not exercise while in a boot, temptation of holidays, mother had stroke who lives in [place] and he was traveling back and forth.  He presents today with minor weight loss and reports he had lost more and then regained.  He states he is motivated to get back on track with his goals.  He also has questions about the weight loss medication option.  He describes to me a friend who is eating small, frequent meals every 4 hours and wants to know if this would be a good meal plan for him.  Wt Readings from Last 3 Encounters:  05/13/14 302 lb 14.4 oz (137.395 kg)  04/21/14 303 lb (137.44 kg)  03/17/14 297 lb (134.718 kg)   Ht Readings from Last 3 Encounters:  05/13/14 5\' 8"  (1.727 m)  04/21/14 5\' 8"  (1.727 m)  03/08/14 5\' 8"  (1.727 m)   Body mass index is 46.07 kg/(m^2).  Learning Readiness:  Contemplating  Ready  MEDICATIONS: see list   DIETARY INTAKE:  Usual eating pattern includes 3 meals and 2 snacks per day.  24-hr recall:  B ( AM): packet of oatmeal OR Special K cinnamon pecan cereal with 2% milk, sometimes banana or pear Snk ( AM): granola bar or fruit L ( PM): cheese its, unsweet tea OR sandwich on dinner roll Or protein snack pack (nuts and Cheese) Snk ( PM): none D ( PM): chicken, fish, and veggie OR grilled chicken salad, water or G2 or unsweet  Snk ( PM): none Beverages: water, G2 gatorade, unsweet tea  Usual physical activity: none  currently  Progress Towards Goal(s):  In progress.   Nutritional Diagnosis:  Shell-3.3 Overweight/obesity continues.    Intervention:  Nutrition counseling for weight management. Discussed barriers and strategies to overcome.  Invited him to bring his significant other to next visit. Reviewed goals from last visit and patient feels motivated to revisit his action steps for meeting these goals.  Advised a weight loss medication may be appropriate to add to his lifestyle changes and recommended meeting with primary doctor to discuss.  Stated that eating smaller more frequent meals would be a great meal routine for him to try since he thinks it will work well for his work schedule.  Discussed action steps for exercise.  Limit regular sodas to twice a week.  Choose diet sodas, unsweet tea, or water the rest of the time. Choose G2 or Powerade Zero. Update: No sodas at home. Some over holidays. Begin regular exercise.  Aim for 3 days every week for 45 min.  Try walking or jogging. If you can't be outdoors try walking in the mall or [employer] or doing a home exercise tape. Update: Was walking for an hour at a time then stopped.  Thinking about joining [employer]. Plans to go on his days off for at least an hour. Continue to eat at regular times throughout the day. Update: Sometimes skips lunch.  Now  plans to eat every 4 hrs. Continue to make 1/2 of your plate vegetables.  Aim for variety and color on your plate. Use smaller plates and bowls at home.  Continue to portion snacks into containers. Still using containers to portion.  Teaching Method Utilized all of the following: Visual Auditory Hands on  Barriers to learning/adherence to lifestyle change: work schedule and lack of support system  Demonstrated degree of understanding via:  Teach Back   Monitoring/Evaluation:  Dietary intake, exercise, and body weight in 3 month(s).

## 2014-06-20 ENCOUNTER — Ambulatory Visit (INDEPENDENT_AMBULATORY_CARE_PROVIDER_SITE_OTHER): Payer: BLUE CROSS/BLUE SHIELD | Admitting: Family Medicine

## 2014-06-20 ENCOUNTER — Encounter: Payer: Self-pay | Admitting: Family Medicine

## 2014-06-20 VITALS — BP 126/77 | HR 71 | Wt 306.0 lb

## 2014-06-20 DIAGNOSIS — F329 Major depressive disorder, single episode, unspecified: Secondary | ICD-10-CM

## 2014-06-20 DIAGNOSIS — F32A Depression, unspecified: Secondary | ICD-10-CM | POA: Insufficient documentation

## 2014-06-20 DIAGNOSIS — E785 Hyperlipidemia, unspecified: Secondary | ICD-10-CM | POA: Diagnosis not present

## 2014-06-20 MED ORDER — VENLAFAXINE HCL ER 150 MG PO CP24: 150.0000 mg | ORAL_CAPSULE | Freq: Every day | ORAL | Status: DC

## 2014-06-20 MED ORDER — VENLAFAXINE HCL ER 37.5 MG PO CP24: ORAL_CAPSULE | ORAL | Status: DC

## 2014-06-20 NOTE — Progress Notes (Signed)
CC: Christopher [name] is a 43 y.o. male is here for Depression   Subjective: HPI:   complains of subjective depression, lack of interest in hobbies, fatigue,  Nervousness about nothing in particular, and irritability that has been present to a mild degree for the past 3 months but over the last 1 month has become present daily basis to a moderate degree. Others in his  Life  Are noticing that he is not acting like he usually does and is chest acting overall  Sad.  Nothing particularly has changed in his life. He still happy with his girlfriend, is secure his job, he doesn't really know of anything that could be influencing the symptoms above. Of interest seems to make them better or worse. Interventions have included him trying to pick up new hobbies but he loses interest within a few days.   Denies thoughts of wanting to harm himself or others. There's been no self-medication. He's never had this before.  Denies any mental disturbance of the above. With respect to his fatigue there is been no unintentional weight loss or gain, shortness of breath, or weakness.   HYPERLIPIDEMIA: he continues to take  Simvastatin 10 mg on a daily basis. No right upper quadrant pain or myalgias.  Review Of Systems Outlined In HPI  Past Medical History  Diagnosis Date  . Hyperlipidemia     No past surgical history on file. Family History  Problem Relation Age of Onset  . Prostate cancer      History   Social History  . Marital Status: Single    Spouse Name: N/A  . Number of Children: N/A  . Years of Education: N/A   Occupational History  . Not on file.   Social History Main Topics  . Smoking status: Never Smoker   . Smokeless tobacco: Never Used  . Alcohol Use: No  . Drug Use: No  . Sexual Activity:    Partners: Female   Other Topics Concern  . Not on file   Social History Narrative   ** Merged History Encounter **         Objective: BP 126/77 mmHg  Pulse 71  Wt 306 lb (138.801 kg)   SpO2 97%  General: Alert and Oriented, No Acute Distress HEENT: Pupils equal, round, reactive to light. Conjunctivae clear.   Moist mucous membranes Lungs:  Clear and comfortable work of breathing Cardiac: Regular rate and rhythm.  Extremities: No peripheral edema.  Strong peripheral pulses.  Mental Status:  Mild depression without anxiety nor agitation. Skin: Warm and dry.  PHQ-9 Score of 19  Assessment & Plan: Christopher [name] was seen today for depression.  Diagnoses and all orders for this visit:  Depression Orders: -     venlafaxine XR (EFFEXOR-XR) 37.5 MG 24 hr capsule; One by mouth daily for first week then two by mouth daily for the second week then begin 150mg  formulation. -     venlafaxine XR (EFFEXOR-XR) 150 MG 24 hr capsule; Take 1 capsule (150 mg total) by mouth daily with breakfast. Start after ramp up regimen. -     TSH -     COMPLETE METABOLIC PANEL WITH GFR -     Lipid panel  Hyperlipidemia Orders: -     COMPLETE METABOLIC PANEL WITH GFR -     Lipid panel    depression: Start ramp up of effexor XR.   Follow-up in 4 weeks.   hyperlipidemia: Due for  Up-to-date lipid panel, checking lipids and liver  enzymes today  Return in about 4 weeks (around 07/18/2014) for Mood.

## 2014-06-21 ENCOUNTER — Telehealth: Payer: Self-pay | Admitting: Family Medicine

## 2014-06-21 LAB — COMPLETE METABOLIC PANEL WITH GFR
ALT: 35 U/L (ref 0–53)
AST: 22 U/L (ref 0–37)
Albumin: 4 g/dL (ref 3.5–5.2)
Alkaline Phosphatase: 78 U/L (ref 39–117)
BILIRUBIN TOTAL: 0.4 mg/dL (ref 0.2–1.2)
BUN: 11 mg/dL (ref 6–23)
CO2: 29 meq/L (ref 19–32)
CREATININE: 0.88 mg/dL (ref 0.50–1.35)
Calcium: 9.4 mg/dL (ref 8.4–10.5)
Chloride: 103 mEq/L (ref 96–112)
GLUCOSE: 79 mg/dL (ref 70–99)
Potassium: 4.1 mEq/L (ref 3.5–5.3)
Sodium: 139 mEq/L (ref 135–145)
Total Protein: 7.7 g/dL (ref 6.0–8.3)

## 2014-06-21 LAB — LIPID PANEL
CHOLESTEROL: 221 mg/dL — AB (ref 0–200)
HDL: 48 mg/dL (ref >39)
LDL Cholesterol: 142 mg/dL — ABNORMAL HIGH (ref 0–99)
Total CHOL/HDL Ratio: 4.6 Ratio
Triglycerides: 153 mg/dL — ABNORMAL HIGH (ref <150)
VLDL: 31 mg/dL (ref 0–40)

## 2014-06-21 LAB — TSH: TSH: 2.227 u[IU]/mL (ref 0.350–4.500)

## 2014-06-21 MED ORDER — SIMVASTATIN 20 MG PO TABS: 20.0000 mg | ORAL_TABLET | Freq: Every day | ORAL | Status: DC

## 2014-06-21 NOTE — Telephone Encounter (Signed)
Mailbox is full.

## 2014-06-21 NOTE — Telephone Encounter (Signed)
Christopher [name], Will you please let patient know that his thyroid function, liver and kidney function, and blood sugar were all normal.. His cholesterol does not appear to be well controlled on his current simvastatin dose therefore I'd recommend increasing it to 20mg  daily, a new Rx was sent to [facility].  F/U with me in one month regarding mood.

## 2014-06-22 NOTE — Telephone Encounter (Signed)
Pt.notified

## 2014-07-22 ENCOUNTER — Ambulatory Visit (INDEPENDENT_AMBULATORY_CARE_PROVIDER_SITE_OTHER): Payer: BLUE CROSS/BLUE SHIELD | Admitting: Family Medicine

## 2014-07-22 ENCOUNTER — Encounter: Payer: Self-pay | Admitting: Family Medicine

## 2014-07-22 VITALS — BP 131/81 | HR 87 | Wt 311.0 lb

## 2014-07-22 DIAGNOSIS — G4733 Obstructive sleep apnea (adult) (pediatric): Secondary | ICD-10-CM

## 2014-07-22 DIAGNOSIS — F329 Major depressive disorder, single episode, unspecified: Secondary | ICD-10-CM | POA: Diagnosis not present

## 2014-07-22 DIAGNOSIS — F32A Depression, unspecified: Secondary | ICD-10-CM

## 2014-07-22 MED ORDER — VENLAFAXINE HCL ER 75 MG PO CP24: 75.0000 mg | ORAL_CAPSULE | Freq: Every day | ORAL | Status: DC

## 2014-07-22 MED ORDER — VILAZODONE HCL 10 & 20 MG PO KIT: 1.0000 | PACK | Freq: Every day | ORAL | Status: DC

## 2014-07-22 NOTE — Progress Notes (Signed)
CC: Christopher [name] is a 44 y.o. male is here for f/u depression   Subjective: HPI:  Follow-up depression: Despite taking 150 mg of Effexor on a daily basis he tells me that he still feels like he subjectively depressed. He still has no interest in hobbies, is much more irritable around others, having regular sleep pattern. He tells me if anything he thinks his symptoms are worse and starting Effexor. He denies any known side effects. Denies thoughts of harm self or others. He also notes that he is much more nervous since starting this medication. No unintentional weight gain or loss. Symptoms are present on a daily basis moderate in severity nothing particular seems to make it better or worse.  Despite using CPAP on a nightly basis he wakes up feeling like he didn't get any sleep at all. His wife notices that he is also snoring despite using the CPAP machine. He reports fatigue but only mild in severity. Symptoms have been present for the last 2 months, present before starting Effexor. No nasal congestion sore throat or shortness of breath   Review Of Systems Outlined In HPI  Past Medical History  Diagnosis Date  . Hyperlipidemia     No past surgical history on file. Family History  Problem Relation Age of Onset  . Prostate cancer      History   Social History  . Marital Status: Single    Spouse Name: N/A  . Number of Children: N/A  . Years of Education: N/A   Occupational History  . Not on file.   Social History Main Topics  . Smoking status: Never Smoker   . Smokeless tobacco: Never Used  . Alcohol Use: No  . Drug Use: No  . Sexual Activity:    Partners: Female   Other Topics Concern  . Not on file   Social History Narrative   ** Merged History Encounter **         Objective: BP 131/81 mmHg  Pulse 87  Wt 311 lb (141.069 kg)  General: Alert and Oriented, No Acute Distress HEENT: Pupils equal, round, reactive to light. Conjunctivae clear.  Moist mucous  membranes pharynx unremarkable Lungs: Clear comfortable work of breathing Cardiac: Regular rate and rhythm. Normal S1/S2.  No murmurs, rubs, nor gallops.   Abdomen: Obese Extremities: No peripheral edema.  Strong peripheral pulses.  Mental Status: Mild depression with no anxiety or agitation Skin: Warm and dry.  Assessment & Plan: Christopher [name] was seen today for f/u depression.  Diagnoses and all orders for this visit:  OSA (obstructive sleep apnea) Orders: -     Cancel: Split night study; Future -     Cpap titration; Future  Depression  Other orders -     venlafaxine XR (EFFEXOR-XR) 75 MG 24 hr capsule; Take 1 capsule (75 mg total) by mouth daily with breakfast. For five days then start Viibryd -     Vilazodone HCl (VIIBRYD STARTER PACK) 10 & 20 MG KIT; Take 1 tablet by mouth daily.   OSA  appears to be uncontrolled placing orders for CPAP titration, disregard  original split night study. Depression: Uncontrolled tapering off of Effexor for the next 5 days then begin Viibryd  Return in about 4 weeks (around 08/19/2014).

## 2014-08-12 ENCOUNTER — Ambulatory Visit: Payer: Self-pay | Admitting: Dietician

## 2014-08-19 ENCOUNTER — Encounter: Payer: Self-pay | Admitting: Family Medicine

## 2014-08-19 ENCOUNTER — Ambulatory Visit (INDEPENDENT_AMBULATORY_CARE_PROVIDER_SITE_OTHER): Payer: BLUE CROSS/BLUE SHIELD | Admitting: Family Medicine

## 2014-08-19 VITALS — BP 127/75 | HR 84 | Wt 317.0 lb

## 2014-08-19 DIAGNOSIS — F329 Major depressive disorder, single episode, unspecified: Secondary | ICD-10-CM | POA: Diagnosis not present

## 2014-08-19 DIAGNOSIS — F32A Depression, unspecified: Secondary | ICD-10-CM

## 2014-08-19 MED ORDER — VILAZODONE HCL 20 MG PO TABS: ORAL_TABLET | ORAL | Status: DC

## 2014-08-19 NOTE — Progress Notes (Signed)
CC: Christopher [name] is a 44 y.o. male is here for f/u depression   Subjective: HPI:  Follow-up depression: Patient states that he is no longer feeling any subjective sense of depression, irritability, anxiety nor any mental disturbance. He is not sure when this started but it sometime between now and when he started on viibryd.  he's not convinced that the medication is the cause of this and he believes that positive thinking is the primary reason that he is feeling somewhat better. He tells me he is very happy these days. Denies any known side effects from this medication and continues to take it on a daily basis he has only a few more days left of the current prescription.   Review Of Systems Outlined In HPI  Past Medical History  Diagnosis Date  . Hyperlipidemia     No past surgical history on file. Family History  Problem Relation Age of Onset  . Prostate cancer      History   Social History  . Marital Status: Single    Spouse Name: N/A  . Number of Children: N/A  . Years of Education: N/A   Occupational History  . Not on file.   Social History Main Topics  . Smoking status: Never Smoker   . Smokeless tobacco: Never Used  . Alcohol Use: No  . Drug Use: No  . Sexual Activity:    Partners: Female   Other Topics Concern  . Not on file   Social History Narrative   ** Merged History Encounter **         Objective: BP 127/75 mmHg  Pulse 84  Wt 317 lb (143.79 kg)  Vital signs reviewed. General: Alert and Oriented, No Acute Distress HEENT: Pupils equal, round, reactive to light. Conjunctivae clear.  External ears unremarkable.  Moist mucous membranes. Lungs: Clear and comfortable work of breathing, speaking in full sentences without accessory muscle use. Cardiac: Regular rate and rhythm.  Neuro: CN II-XII grossly intact, gait normal. Extremities: No peripheral edema.  Strong peripheral pulses.  Mental Status: No depression, anxiety, nor agitation. Logical though  process. Skin: Warm and dry.  Assessment & Plan: Christopher [name] was seen today for f/u depression.  Diagnoses and all orders for this visit:  Depression  Other orders -     Vilazodone HCl (VIIBRYD) 20 MG TABS; One by mouth daily.   Depression: Improved and currently appears to be completely asymptomatic and has no mental health concerns. He is convinced that the medication is not the primary reason why he is feeling so much better and he wants know if he can stop it to see just how much of an influence it has on him. Joint decision that it would be safe for him to stop it but to have a prescription on hand should any irritability anxiousness or depression return so that he can restart it as soon as possible.  Return if symptoms worsen or fail to improve.

## 2014-10-11 ENCOUNTER — Encounter (HOSPITAL_BASED_OUTPATIENT_CLINIC_OR_DEPARTMENT_OTHER): Payer: Self-pay

## 2014-12-06 ENCOUNTER — Encounter (HOSPITAL_BASED_OUTPATIENT_CLINIC_OR_DEPARTMENT_OTHER): Payer: Self-pay

## 2015-01-20 ENCOUNTER — Encounter: Payer: Self-pay | Admitting: Family Medicine

## 2015-01-20 ENCOUNTER — Ambulatory Visit (INDEPENDENT_AMBULATORY_CARE_PROVIDER_SITE_OTHER): Payer: BLUE CROSS/BLUE SHIELD | Admitting: Family Medicine

## 2015-01-20 VITALS — BP 141/77 | HR 82 | Ht 67.75 in | Wt 318.0 lb

## 2015-01-20 DIAGNOSIS — Z0000 Encounter for general adult medical examination without abnormal findings: Secondary | ICD-10-CM | POA: Diagnosis not present

## 2015-01-20 DIAGNOSIS — Z8042 Family history of malignant neoplasm of prostate: Secondary | ICD-10-CM | POA: Diagnosis not present

## 2015-01-20 DIAGNOSIS — Z125 Encounter for screening for malignant neoplasm of prostate: Secondary | ICD-10-CM | POA: Diagnosis not present

## 2015-01-20 MED ORDER — VILAZODONE HCL 20 MG PO TABS: ORAL_TABLET | ORAL | Status: DC

## 2015-01-20 NOTE — Patient Instructions (Signed)
Dr. Nezzie Manera's General Advice Following Your Complete Physical Exam  The Benefits of Regular Exercise: Unless you suffer from an uncontrolled cardiovascular condition, studies strongly suggest that regular exercise and physical activity will add to both the quality and length of your life.  The World Health Organization recommends 150 minutes of moderate intensity aerobic activity every week.  This is best split over 3-4 days a week, and can be as simple as a brisk walk for just over 35 minutes "most days of the week".  This type of exercise has been shown to lower LDL-Cholesterol, lower average blood sugars, lower blood pressure, lower cardiovascular disease risk, improve memory, and increase one's overall sense of wellbeing.  The addition of anaerobic (or "strength training") exercises offers additional benefits including but not limited to increased metabolism, prevention of osteoporosis, and improved overall cholesterol levels.  How Can I Strive For A Low-Fat Diet?: Current guidelines recommend that 25-35 percent of your daily energy (food) intake should come from fats.  One might ask how can this be achieved without having to dissect each meal on a daily basis?  Switch to skim or 1% milk instead of whole milk.  Focus on lean meats such as ground turkey, fresh fish, baked chicken, and lean cuts of beef as your source of dietary protein.  Limit saturated fat consumption to less than 10% of your daily caloric intake.  Limit trans fatty acid consumption primarily by limiting synthetic trans fats such as partially hydrogenated oils (Ex: fried fast foods).  Substitute olive or vegetable oil for solid fats where possible.  Moderation of Salt Intake: Provided you don't carry a diagnosis of congestive heart failure nor renal failure, I recommend a daily allowance of no more than 2300 mg of salt (sodium).  Keeping under this daily goal is associated with a decreased risk of cardiovascular events, creeping  above it can lead to elevated blood pressures and increases your risk of cardiovascular events.  Milligrams (mg) of salt is listed on all nutrition labels, and your daily intake can add up faster than you think.  Most canned and frozen dinners can pack in over half your daily salt allowance in one meal.    Lifestyle Health Risks: Certain lifestyle choices carry specific health risks.  As you may already know, tobacco use has been associated with increasing one's risk of cardiovascular disease, pulmonary disease, numerous cancers, among many other issues.  What you may not know is that there are medications and nicotine replacement strategies that can more than double your chances of successfully quitting.  I would be thrilled to help manage your quitting strategy if you currently use tobacco products.  When it comes to alcohol use, I've yet to find an "ideal" daily allowance.  Provided an individual does not have a medical condition that is exacerbated by alcohol consumption, general guidelines determine "safe drinking" as no more than two standard drinks for a man or no more than one standard drink for a male per day.  However, much debate still exists on whether any amount of alcohol consumption is technically "safe".  My general advice, keep alcohol consumption to a minimum for general health promotion.  If you or others believe that alcohol, tobacco, or recreational drug use is interfering with your life, I would be happy to provide confidential counseling regarding treatment options.  General "Over The Counter" Nutrition Advice: Postmenopausal women should aim for a daily calcium intake of 1200 mg, however a significant portion of this might already be   provided by diets including milk, yogurt, cheese, and other dairy products.  Vitamin D has been shown to help preserve bone density, prevent fatigue, and has even been shown to help reduce falls in the elderly.  Ensuring a daily intake of 800 Units of  Vitamin D is a good place to start to enjoy the above benefits, we can easily check your Vitamin D level to see if you'd potentially benefit from supplementation beyond 800 Units a day.  Folic Acid intake should be of particular concern to women of childbearing age.  Daily consumption of 400-800 mcg of Folic Acid is recommended to minimize the chance of spinal cord defects in a fetus should pregnancy occur.    For many adults, accidents still remain one of the most common culprits when it comes to cause of death.  Some of the simplest but most effective preventitive habits you can adopt include regular seatbelt use, proper helmet use, securing firearms, and regularly testing your smoke and carbon monoxide detectors.  Christopher Fowles B. Christopher Dowson DO [facility] [address], [address] [place], [place] [address] Phone: [phone]  

## 2015-01-20 NOTE — Progress Notes (Signed)
CC: Christopher [name] is a 44 y.o. male is here for Annual Exam   Subjective: HPI:  Colonoscopy:no current indication Prostate: Discussed screening risks/beneifts with patient today, he has a cousin and uncle with prostate cancer, will begin screening today with PSA.  Influenza Vaccine: Declined Pneumovax: no current indication Td/Tdap: UTD from 2014 Zoster: (Start 45 yo)  Requesting complete physical exam with no complaints. His still taking viibryd and feels like it's helping keep his mood stable. He denies any sadness or depression.  Review of Systems - General ROS: negative for - chills, fever, night sweats, weight gain or weight loss Ophthalmic ROS: negative for - decreased vision Psychological ROS: negative for - anxiety or depression ENT ROS: negative for - hearing change, nasal congestion, tinnitus or allergies Hematological and Lymphatic ROS: negative for - bleeding problems, bruising or swollen lymph nodes Breast ROS: negative Respiratory ROS: no cough, shortness of breath, or wheezing Cardiovascular ROS: no chest pain or dyspnea on exertion Gastrointestinal ROS: no abdominal pain, change in bowel habits, or black or bloody stools Genito-Urinary ROS: negative for - genital discharge, genital ulcers, incontinence or abnormal bleeding from genitals Musculoskeletal ROS: negative for - joint pain or muscle pain Neurological ROS: negative for - headaches or memory loss Dermatological ROS: negative for lumps, mole changes, rash and skin lesion changes  Past Medical History  Diagnosis Date  . Hyperlipidemia     No past surgical history on file. Family History  Problem Relation Age of Onset  . Prostate cancer      Social History   Social History  . Marital Status: Single    Spouse Name: N/A  . Number of Children: N/A  . Years of Education: N/A   Occupational History  . Not on file.   Social History Main Topics  . Smoking status: Never Smoker   . Smokeless tobacco:  Never Used  . Alcohol Use: No  . Drug Use: No  . Sexual Activity:    Partners: Female   Other Topics Concern  . Not on file   Social History Narrative   ** Merged History Encounter **         Objective: BP 141/77 mmHg  Pulse 82  Ht 5' 7.75" (1.721 m)  Wt 318 lb (144.244 kg)  BMI 48.70 kg/m2  General: No Acute Distress HEENT: Atraumatic, normocephalic, conjunctivae normal without scleral icterus.  No nasal discharge, hearing grossly intact, TMs with good landmarks bilaterally with no middle ear abnormalities, posterior pharynx clear without oral lesions. Neck: Supple, trachea midline, no cervical nor supraclavicular adenopathy. Pulmonary: Clear to auscultation bilaterally without wheezing, rhonchi, nor rales. Cardiac: Regular rate and rhythm.  No murmurs, rubs, nor gallops. No peripheral edema.  2+ peripheral pulses bilaterally. Abdomen: Bowel sounds normal.  No masses.  Non-tender without rebound.  Negative Murphy's sign. MSK: Grossly intact, no signs of weakness.  Full strength throughout upper and lower extremities.  Full ROM in upper and lower extremities.  No midline spinal tenderness. Neuro: Gait unremarkable, CN II-XII grossly intact.  C5-C6 Reflex 2/4 Bilaterally, L4 Reflex 2/4 Bilaterally.  Cerebellar function intact. Skin: No rashes. Psych: Alert and oriented to person/place/time.  Thought process normal. No anxiety/depression. Assessment & Plan: Christopher [name] was seen today for annual exam.  Diagnoses and all orders for this visit:  Annual physical exam -     CBC -     COMPLETE METABOLIC PANEL WITH GFR -     Lipid panel  Prostate cancer screening -  PSA  Family history of prostate cancer  Other orders -     Discontinue: Vilazodone HCl (VIIBRYD) 20 MG TABS; One by mouth daily. -     Vilazodone HCl (VIIBRYD) 20 MG TABS; One by mouth daily.   Healthy lifestyle interventions including but not limited to regular exercise, a healthy low fat diet, moderation of salt  intake, the dangers of tobacco/alcohol/recreational drug use, nutrition supplementation, and accident avoidance were discussed with the patient and a handout was provided for future reference.  Return in about 6 months (around 07/20/2015) for mood.

## 2015-01-26 ENCOUNTER — Ambulatory Visit (HOSPITAL_BASED_OUTPATIENT_CLINIC_OR_DEPARTMENT_OTHER): Payer: BLUE CROSS/BLUE SHIELD | Attending: Family Medicine

## 2015-01-26 ENCOUNTER — Encounter (HOSPITAL_BASED_OUTPATIENT_CLINIC_OR_DEPARTMENT_OTHER): Payer: Self-pay

## 2015-01-26 ENCOUNTER — Telehealth: Payer: Self-pay | Admitting: *Deleted

## 2015-01-26 VITALS — Ht 68.0 in | Wt 290.0 lb

## 2015-01-26 DIAGNOSIS — G4736 Sleep related hypoventilation in conditions classified elsewhere: Secondary | ICD-10-CM | POA: Insufficient documentation

## 2015-01-26 DIAGNOSIS — I493 Ventricular premature depolarization: Secondary | ICD-10-CM | POA: Diagnosis not present

## 2015-01-26 DIAGNOSIS — G4733 Obstructive sleep apnea (adult) (pediatric): Secondary | ICD-10-CM

## 2015-01-26 DIAGNOSIS — Z9989 Dependence on other enabling machines and devices: Secondary | ICD-10-CM

## 2015-01-26 DIAGNOSIS — R0683 Snoring: Secondary | ICD-10-CM | POA: Insufficient documentation

## 2015-01-26 DIAGNOSIS — G479 Sleep disorder, unspecified: Secondary | ICD-10-CM | POA: Diagnosis not present

## 2015-01-26 NOTE — Telephone Encounter (Signed)
Sleep lab called and states they needed a copy of sleep study. The patient is scheduled to go there tonight for CPAP titration. Terri from the sleep lab stated that if they did not receive the sleep study report then they would need the order to be changed to a split night study. Called patient and he did not have a copy of the report and the only thing that he did know was that it was done in [place]. I am going to go ahead and change the order. The patient is aware of this

## 2015-01-27 NOTE — Telephone Encounter (Signed)
I am ok with this.

## 2015-01-29 DIAGNOSIS — G4733 Obstructive sleep apnea (adult) (pediatric): Secondary | ICD-10-CM | POA: Diagnosis not present

## 2015-01-29 NOTE — Progress Notes (Signed)
  Patient Name: Christopher [name], Christopher [name] Date: 01/26/2015 Gender: Male D.O.B: [birth year] Age (years): 44 Referring Provider: Marcial Pacas Height (inches): 68 Interpreting Physician: Baird Lyons MD, ABSM Weight (lbs): 290 RPSGT: Joni Reining BMI: 47 MRN: [id number] Neck Size: 17.50 CLINICAL INFORMATION Sleep Study Type: NPSG  Indication for sleep study: OSA  Epworth Sleepiness Score: 7/24  SLEEP STUDY TECHNIQUE As per the AASM Manual for the Scoring of Sleep and Associated Events v2.3 (April 2016) with a hypopnea requiring 4% desaturations.  The channels recorded and monitored were frontal, central and occipital EEG, electrooculogram (EOG), submentalis EMG (chin), nasal and oral airflow, thoracic and abdominal wall motion, anterior tibialis EMG, snore microphone, electrocardiogram, and pulse oximetry.  MEDICATIONS Patient's medications include: charted for review. Medications self-administered by patient during sleep study : No sleep medicine administered.  SLEEP ARCHITECTURE The study was initiated at 9:54:55 PM and ended at 4:08:43 AM.  Sleep onset time was 63.9 minutes and the sleep efficiency was 40.1%. The total sleep time was 150.0 minutes.  Stage REM latency was N/A minutes.  The patient spent 21.33% of the night in stage N1 sleep, 78.67% in stage N2 sleep, 0.00% in stage N3 and 0.00% in REM.  Alpha intrusion was absent.  Supine sleep was 4.00%.  Wake after sleep onset 159.9 minutes  RESPIRATORY PARAMETERS The overall apnea/hypopnea index (AHI) was 59.2 per hour. There were 72 total apneas, including 72 obstructive, 0 central and 0 mixed apneas. There were 76 hypopneas and 41 RERAs.  The AHI during Stage REM sleep was N/A per hour.  AHI while supine was 50.0 per hour.  The mean oxygen saturation was 92.74%. The minimum SpO2 during sleep was 85.00%.  Soft snoring was noted during this study.  CARDIAC DATA The 2 lead EKG demonstrated sinus rhythm. The mean  heart rate was 64.84 beats per minute. Other EKG findings include: PVCs.  LEG MOVEMENT DATA The total PLMS were 0 with a resulting PLMS index of 0.00. Associated arousal with leg movement index was 0.0 .  IMPRESSIONS Severe obstructive sleep apnea occurred during this study (AHI = 59.2/h). Substantial difficulty initiating and maintaining sleep. Patient works rotating shifts and had worked the night before, then slept on the study day from 0800 to 1200. He was unable to accumulate minimum amount of sleep time early in the study to meet requirements for split CPAP titration protocol. No significant central sleep apnea occurred during this study (CAI = 0.0/h). Mild oxygen desaturation was noted during this study (Min O2 = 85.00%). The patient snored with Soft snoring volume. EKG findings include PVCs. Clinically significant periodic limb movements did not occur during sleep. No significant associated arousals.   DIAGNOSIS Obstructive Sleep Apnea (327.23 [G47.33 ICD-10]) Nocturnal Hypoxemia (327.26 [G47.36 ICD-10]) Shift work sleep disorder  RECOMMENDATIONS Therapeutic CPAP titration to determine optimal pressure required to alleviate sleep disordered breathing. Avoid alcohol, sedatives and other CNS depressants that may worsen sleep apnea and disrupt normal sleep architecture. Sleep hygiene should be reviewed to assess factors that may improve sleep quality. Weight management and regular exercise should be initiated or continued if appropriate.  Deneise Lever Diplomate, American Board of Sleep Medicine  ELECTRONICALLY SIGNED ON:  01/29/2015, 12:54 PM [place] PH: [phone]   FX: [phone] [place]

## 2015-01-31 ENCOUNTER — Telehealth: Payer: Self-pay | Admitting: Family Medicine

## 2015-01-31 LAB — LIPID PANEL
CHOL/HDL RATIO: 4.3 ratio (ref <=5.0)
CHOLESTEROL: 186 mg/dL (ref 125–200)
HDL: 43 mg/dL (ref >=40)
LDL Cholesterol: 110 mg/dL (ref <130)
Triglycerides: 163 mg/dL — ABNORMAL HIGH (ref <150)
VLDL: 33 mg/dL — AB (ref <30)

## 2015-01-31 LAB — COMPLETE METABOLIC PANEL WITH GFR
ALBUMIN: 4.2 g/dL (ref 3.6–5.1)
ALK PHOS: 78 U/L (ref 40–115)
ALT: 32 U/L (ref 9–46)
AST: 20 U/L (ref 10–40)
BUN: 11 mg/dL (ref 7–25)
CALCIUM: 9.4 mg/dL (ref 8.6–10.3)
CHLORIDE: 103 mmol/L (ref 98–110)
CO2: 28 mmol/L (ref 20–31)
Creat: 0.95 mg/dL (ref 0.60–1.35)
GFR, Est African American: >89 mL/min (ref >=60)
Glucose, Bld: 95 mg/dL (ref 65–99)
POTASSIUM: 4.3 mmol/L (ref 3.5–5.3)
Sodium: 140 mmol/L (ref 135–146)
Total Bilirubin: 0.4 mg/dL (ref 0.2–1.2)
Total Protein: 7.4 g/dL (ref 6.1–8.1)

## 2015-01-31 LAB — PSA: PSA: 1.49 ng/mL (ref <=4.00)

## 2015-01-31 LAB — CBC
HCT: 45.6 % (ref 39.0–52.0)
HEMOGLOBIN: 14.9 g/dL (ref 13.0–17.0)
MCH: 26.8 pg (ref 26.0–34.0)
MCHC: 32.7 g/dL (ref 30.0–36.0)
MCV: 82 fL (ref 78.0–100.0)
MPV: 10.2 fL (ref 8.6–12.4)
Platelets: 296 10*3/uL (ref 150–400)
RBC: 5.56 MIL/uL (ref 4.22–5.81)
RDW: 16.4 % — AB (ref 11.5–15.5)
WBC: 6.4 10*3/uL (ref 4.0–10.5)

## 2015-01-31 MED ORDER — SIMVASTATIN 20 MG PO TABS: 20.0000 mg | ORAL_TABLET | Freq: Every day | ORAL | Status: DC

## 2015-01-31 NOTE — Telephone Encounter (Signed)
Pt.notified

## 2015-01-31 NOTE — Telephone Encounter (Signed)
Christopher [name], Will you please let patient know that his kidney function, liver function, blood sugar, blood cell counts and his LDL cholesterol were all normal.  I'll send in a years worth of refills on his simvastatin to [facility].

## 2015-02-01 ENCOUNTER — Telehealth: Payer: Self-pay | Admitting: Family Medicine

## 2015-02-01 DIAGNOSIS — G4733 Obstructive sleep apnea (adult) (pediatric): Secondary | ICD-10-CM

## 2015-02-01 NOTE — Telephone Encounter (Signed)
Christopher [name], Will you please let patient know that his sleep study confirmed sleep apnea but they ran out of time to try him on CPAP.  I've placed an order for CPAP testing to find the best pressure setting for him.  Please let me know if not contacted by next week.

## 2015-02-02 NOTE — Telephone Encounter (Signed)
Pt.notified

## 2015-02-03 ENCOUNTER — Ambulatory Visit (HOSPITAL_BASED_OUTPATIENT_CLINIC_OR_DEPARTMENT_OTHER): Payer: BLUE CROSS/BLUE SHIELD | Attending: Family Medicine | Admitting: Radiology

## 2015-02-03 VITALS — Ht 68.0 in | Wt 290.0 lb

## 2015-02-03 DIAGNOSIS — G4730 Sleep apnea, unspecified: Secondary | ICD-10-CM | POA: Diagnosis present

## 2015-02-03 DIAGNOSIS — I493 Ventricular premature depolarization: Secondary | ICD-10-CM | POA: Insufficient documentation

## 2015-02-03 DIAGNOSIS — G4733 Obstructive sleep apnea (adult) (pediatric): Secondary | ICD-10-CM | POA: Diagnosis not present

## 2015-02-03 DIAGNOSIS — Z9989 Dependence on other enabling machines and devices: Secondary | ICD-10-CM

## 2015-02-05 DIAGNOSIS — G4733 Obstructive sleep apnea (adult) (pediatric): Secondary | ICD-10-CM | POA: Diagnosis not present

## 2015-02-05 NOTE — Progress Notes (Signed)
  Patient Name: Cannen, Dupras Date: 02/03/2015 Gender: Male D.O.B: [birth year] Age (years): 44 Referring Provider: Marcial Pacas Height (inches): 68 Interpreting Physician: Baird Lyons MD, ABSM Weight (lbs): 290 RPSGT: Zadie Rhine BMI: 44 MRN: [id number] Neck Size: 17.00 CLINICAL INFORMATION The patient is referred for a CPAP titration to treat sleep apnea.  Date of diagnostic NPSG, Split Night or HST:  NPSG 01/26/15, AHI 59.2/ hr,  body weight 290 lbs  SLEEP STUDY TECHNIQUE As per the AASM Manual for the Scoring of Sleep and Associated Events v2.3 (April 2016) with a hypopnea requiring 4% desaturations.  The channels recorded and monitored were frontal, central and occipital EEG, electrooculogram (EOG), submentalis EMG (chin), nasal and oral airflow, thoracic and abdominal wall motion, anterior tibialis EMG, snore microphone, electrocardiogram, and pulse oximetry. Continuous positive airway pressure (CPAP) was initiated at the beginning of the study and titrated to treat sleep-disordered breathing.  MEDICATIONS Medications taken by the patient : charted for review Medications administered by patient during sleep study : No sleep medicine administered.  TECHNICIAN COMMENTS Comments added by technician: NO MEDICATION TAKEN. CPAP PRESSURE 17 CM H20 WITH HEATED HUMIDITY AND HEATED TUBBING. ONE RESTROOM VISTED  Comments added by scorer: N/A  RESPIRATORY PARAMETERS Optimal PAP Pressure (cm): 17 AHI at Optimal Pressure (/hr): 0.0 Overall Minimal O2 (%): 81.00 Supine % at Optimal Pressure (%): 0 Minimal O2 at Optimal Pressure (%): 91.0    SLEEP ARCHITECTURE The study was initiated at 10:23:38 PM and ended at 4:22:44 AM.  Sleep onset time was 12.4 minutes and the sleep efficiency was 83.5%. The total sleep time was 300.0 minutes.  The patient spent 6.33% of the night in stage N1 sleep, 71.17% in stage N2 sleep, 1.50% in stage N3 and 21.00% in REM.Stage REM latency was 74.0  minutes  Wake after sleep onset was 46.7. Alpha intrusion was absent. Supine sleep was 0.00%.  CARDIAC DATA The 2 lead EKG demonstrated sinus rhythm. The mean heart rate was 79.21 beats per minute. Other EKG findings include: PVCs.  LEG MOVEMENT DATA The total Periodic Limb Movements of Sleep (PLMS) were 0. The PLMS index was 0.00. A PLMS index of <15 is considered normal in adults.  IMPRESSIONS The optimal PAP pressure was 17 cm of water. Central sleep apnea was not noted during this titration (CAI = 0.0/h). Moderate oxygen desaturations were observed during this titration (min O2 = 81.00%). No snoring was audible during this study. 2-lead EKG demonstrated: PVCs Clinically significant periodic limb movements were not noted during this study. Arousals associated with PLMs were rare.  DIAGNOSIS Obstructive Sleep Apnea (327.23 [G47.33 ICD-10])  RECOMMENDATIONS Trial of CPAP therapy on 17 cm H2O with a Wide size Resmed Nasal Mask Mirage FX mask and heated humidification. Avoid alcohol, sedatives and other CNS depressants that may worsen sleep apnea and disrupt normal sleep architecture. Sleep hygiene should be reviewed to assess factors that may improve sleep quality. Weight management and regular exercise should be initiated or continued.    [place], American Board of Sleep Medicine  ELECTRONICALLY SIGNED ON:  02/05/2015, 1:58 PM [place] PH: [phone]   FX: [phone] [place]

## 2015-02-09 ENCOUNTER — Telehealth: Payer: Self-pay | Admitting: Family Medicine

## 2015-02-09 MED ORDER — AMBULATORY NON FORMULARY MEDICATION: Status: DC

## 2015-02-09 NOTE — Telephone Encounter (Signed)
Order,demographics, insurance cards, printed and faxed to [facility] faxed to [phone]

## 2015-02-09 NOTE — Telephone Encounter (Signed)
Christopher [name], Will you please contact your choice of respiratory supply company and see if they can supply Christopher [name] with a CPAP machine and supplies.  Rx in your in box.  Can you also let patient know that this is being processed?

## 2015-02-23 ENCOUNTER — Encounter (HOSPITAL_BASED_OUTPATIENT_CLINIC_OR_DEPARTMENT_OTHER): Payer: Self-pay

## 2015-08-02 ENCOUNTER — Encounter: Payer: Self-pay | Admitting: Family Medicine

## 2015-08-02 ENCOUNTER — Ambulatory Visit (INDEPENDENT_AMBULATORY_CARE_PROVIDER_SITE_OTHER): Payer: BLUE CROSS/BLUE SHIELD | Admitting: Family Medicine

## 2015-08-02 VITALS — BP 135/93 | HR 73 | Wt 315.0 lb

## 2015-08-02 DIAGNOSIS — M7062 Trochanteric bursitis, left hip: Secondary | ICD-10-CM | POA: Diagnosis not present

## 2015-08-02 NOTE — Progress Notes (Signed)
CC: Christopher [name] is a 45 y.o. male is here for Hip Pain and Leg Pain   Subjective: HPI:  Left hip pain localized in the lateral aspect of the hip is present for the past week. It began the night after he was working out at a [employer]. It was slowly getting better without any intervention however last night became much more problematic periods moderate in severity worse lying on the left hip and nothing else seems to make it better or worse. It's occasionally radiating down the left side of the hip. He denies any groin pain or buttock pain. No other trauma other than that described above. He denies any back pain or knee pain. No benefit from icing, heat or rest. Denies any genitourinary complaints or weakness   Review Of Systems Outlined In HPI  Past Medical History  Diagnosis Date  . Hyperlipidemia     No past surgical history on file. Family History  Problem Relation Age of Onset  . Prostate cancer      Social History   Social History  . Marital Status: Single    Spouse Name: N/A  . Number of Children: N/A  . Years of Education: N/A   Occupational History  . Not on file.   Social History Main Topics  . Smoking status: Never Smoker   . Smokeless tobacco: Never Used  . Alcohol Use: No  . Drug Use: No  . Sexual Activity:    Partners: Female   Other Topics Concern  . Not on file   Social History Narrative   ** Merged History Encounter **         Objective: BP 135/93 mmHg  Pulse 73  Wt 315 lb (142.883 kg)  Vital signs reviewed. General: Alert and Oriented, No Acute Distress HEENT: Pupils equal, round, reactive to light. Conjunctivae clear.  External ears unremarkable.  Moist mucous membranes. Lungs: Clear and comfortable work of breathing, speaking in full sentences without accessory muscle use. Cardiac: Regular rate and rhythm.  Neuro: CN II-XII grossly intact, gait normal. Extremities: No peripheral edema.  Strong peripheral pulses. No reproduction of pain  with resisted hip flexion or extension. Pain is not necessarily reproduced with internal or external rotation however he does feel a tightness in the region where his pain is when putting these maneuvers. Pain is reduced with palpation of the greater trochanter on the left. Mental Status: No depression, anxiety, nor agitation. Logical though process. Skin: Warm and dry. Assessment & Plan: Christopher [name] was seen today for hip pain and leg pain.  Diagnoses and all orders for this visit:  Trochanteric bursitis of left hip  Steroid injection today along with daily home rehabilitation plan that was given to him today. I've asked him to call me if there is no signs of improvement by Monday.  Bursa Injection Procedure Note  Pre-operative Diagnosis: left Trochanteric bursitis  Post-operative Diagnosis: same  Indications: Diagnosis and treatment of symptomatic bursal effusion  Anesthesia: Lidocaine 1% without epinephrine without added sodium bicarbonate and 31mL * 40mg /mL kennalog  Procedure Details   After a discussion of the risks and benefits with the patient (including the possibility that any manipulation of the bursa could introduce infection, worsening the current situation significantly), verbal consent was obtained for the procedure. The joint was prepped with alcohol  An 23 gauge needle was introduced into the fluid collection and 0 ml of   fluid was removed. 33mL of the above medication was then injected. The injection site  was cleansed with topical isopropyl alcohol and a dressing was applied.  Complications:  None; patient tolerated the procedure well.   Return if symptoms worsen or fail to improve.

## 2015-09-20 ENCOUNTER — Encounter: Payer: Self-pay | Admitting: Family Medicine

## 2015-09-20 ENCOUNTER — Ambulatory Visit (INDEPENDENT_AMBULATORY_CARE_PROVIDER_SITE_OTHER): Payer: BLUE CROSS/BLUE SHIELD | Admitting: Family Medicine

## 2015-09-20 VITALS — BP 149/88 | HR 73 | Wt 307.0 lb

## 2015-09-20 DIAGNOSIS — H10029 Other mucopurulent conjunctivitis, unspecified eye: Secondary | ICD-10-CM | POA: Insufficient documentation

## 2015-09-20 DIAGNOSIS — E785 Hyperlipidemia, unspecified: Secondary | ICD-10-CM

## 2015-09-20 LAB — LIPID PANEL
CHOLESTEROL: 202 mg/dL — AB (ref 125–200)
HDL: 48 mg/dL (ref >=40)
LDL Cholesterol: 130 mg/dL — ABNORMAL HIGH (ref <130)
Total CHOL/HDL Ratio: 4.2 Ratio (ref <=5.0)
Triglycerides: 120 mg/dL (ref <150)
VLDL: 24 mg/dL (ref <30)

## 2015-09-20 MED ORDER — POLYMYXIN B-TRIMETHOPRIM 10000-0.1 UNIT/ML-% OP SOLN: 2.0000 [drp] | OPHTHALMIC | Status: DC

## 2015-09-20 NOTE — Progress Notes (Signed)
CC: Christopher [name] is a 45 y.o. male is here for Hyperlipidemia and Conjunctivitis   Subjective: HPI:  Follow-up hyperlipidemia: It's been almost 9 months since his last cholesterol check. He is taking Zocor daily basis without any known side effects. He denies right upper quadrant pain or myalgias. No chest pain or limb claudication  His only complaint today is right-sided pinkeye with drainage that occurs throughout the day. Every morning when he wakes up it seems to be crusted shut. He denies any photophobia, vision disturbance, nor eye pain. Somewhat itchy. No fevers, chills or headache   Review Of Systems Outlined In HPI  Past Medical History  Diagnosis Date  . Hyperlipidemia     No past surgical history on file. Family History  Problem Relation Age of Onset  . Prostate cancer      Social History   Social History  . Marital Status: Single    Spouse Name: N/A  . Number of Children: N/A  . Years of Education: N/A   Occupational History  . Not on file.   Social History Main Topics  . Smoking status: Never Smoker   . Smokeless tobacco: Never Used  . Alcohol Use: No  . Drug Use: No  . Sexual Activity:    Partners: Female   Other Topics Concern  . Not on file   Social History Narrative   ** Merged History Encounter **         Objective: BP 149/88 mmHg  Pulse 73  Wt 307 lb (139.254 kg)  General: Alert and Oriented, No Acute Distress HEENT: Pupils equal, round, reactive to light. Left Conjunctivae clear, right conjunctival erythema in the periphery.  External ears unremarkable, canals clear with intact TMs with appropriate landmarks.  Middle ear appears open without effusion. Pink inferior turbinates.  Moist mucous membranes, pharynx without inflammation nor lesions.  Neck supple without palpable lymphadenopathy nor abnormal masses. Lungs: Clear and comfortable work of breathing Cardiac: Regular rate and rhythm.  Extremities: No peripheral edema.  Strong  peripheral pulses.  Mental Status: No depression, anxiety, nor agitation. Skin: Warm and dry.  Assessment & Plan: Christopher [name] was seen today for hyperlipidemia and conjunctivitis.  Diagnoses and all orders for this visit:  Hyperlipidemia -     Lipid panel  Pink eye, unspecified laterality -     trimethoprim-polymyxin b (POLYTRIM) ophthalmic solution; Place 2 drops into the right eye every 4 (four) hours. For ten days.  Other orders -     Cancel: simvastatin (ZOCOR) 20 MG tablet; Take 1 tablet (20 mg total) by mouth at bedtime.   Hyperlipidemia: Due for lipid panel, will provide refills of simvastatin if at goal Right-sided pinkeye: Start Polytrim.Signs and symptoms requring emergent/urgent reevaluation were discussed with the patient.  Return if symptoms worsen or fail to improve.

## 2015-09-21 ENCOUNTER — Telehealth: Payer: Self-pay | Admitting: Family Medicine

## 2015-09-21 MED ORDER — ROSUVASTATIN CALCIUM 20 MG PO TABS: 20.0000 mg | ORAL_TABLET | Freq: Every day | ORAL | Status: DC

## 2015-09-21 NOTE — Telephone Encounter (Signed)
Pt notified of new medication changes

## 2015-09-21 NOTE — Telephone Encounter (Signed)
Will you please let patient know that his LDL cholesterol is not adequately controlled with his current cholesterol medication, i'd recommend switching this to rosuvastatin that I've sent to his [facility]. I'd like to recheck his cholesterol in three months.

## 2015-10-23 ENCOUNTER — Emergency Department
Admission: EM | Admit: 2015-10-23 | Discharge: 2015-10-23 | Disposition: A | Discharge status: Home / Self Care | Payer: BLUE CROSS/BLUE SHIELD | Source: Home / Self Care | Attending: Family Medicine | Admitting: Family Medicine

## 2015-10-23 ENCOUNTER — Encounter: Payer: Self-pay | Admitting: Emergency Medicine

## 2015-10-23 DIAGNOSIS — J069 Acute upper respiratory infection, unspecified: Secondary | ICD-10-CM

## 2015-10-23 DIAGNOSIS — B9789 Other viral agents as the cause of diseases classified elsewhere: Principal | ICD-10-CM

## 2015-10-23 MED ORDER — BENZONATATE 200 MG PO CAPS: 200.0000 mg | ORAL_CAPSULE | Freq: Every day | ORAL | Status: DC

## 2015-10-23 MED ORDER — AZITHROMYCIN 250 MG PO TABS: ORAL_TABLET | ORAL | Status: DC

## 2015-10-23 NOTE — Discharge Instructions (Signed)
Take plain guaifenesin (1200mg  extended release tabs such as Mucinex) twice daily, with plenty of water, for cough and congestion.  May add Pseudoephedrine (30mg , one or two every 4 to 6 hours) for sinus congestion.  Get adequate rest.   May use Afrin nasal spray (or generic oxymetazoline) twice daily for about 5 days and then discontinue.  Also recommend using saline nasal spray several times daily and saline nasal irrigation (AYR is a common brand).  Use Flonase nasal spray each morning after using Afrin nasal spray and saline nasal irrigation. Try warm salt water gargles for sore throat.  Stop all antihistamines for now, and other non-prescription cough/cold preparations. May take Ibuprofen 200mg , 4 tabs every 8 hours with food for body aches, fever, etc. Begin Azithromycin if not improving about one week or if persistent fever develops   Follow-up with family doctor if not improving about10 days.

## 2015-10-23 NOTE — ED Notes (Signed)
Nasal congestion, dizziness, headache, body aches, nausea x 2 days

## 2015-10-23 NOTE — ED Provider Notes (Signed)
CSN: [id number]     Arrival date & time 10/23/15  1356 History   First MD Initiated Contact with Patient 10/23/15 1423     Chief Complaint  Patient presents with  . Nasal Congestion      HPI Comments: Yesterday patient developed typical cold-like symptoms including mild sore throat, sinus congestion, headache, fatigue, and cough.   The history is provided by the patient.    Past Medical History  Diagnosis Date  . Hyperlipidemia    History reviewed. No pertinent past surgical history. Family History  Problem Relation Age of Onset  . Prostate cancer     Social History  Substance Use Topics  . Smoking status: Never Smoker   . Smokeless tobacco: Never Used  . Alcohol Use: Yes    Review of Systems + sore throat + cough No pleuritic pain No wheezing + nasal congestion + post-nasal drainage No sinus pain/pressure No itchy/red eyes No earache No hemoptysis No SOB No fever, + chills No nausea No vomiting No abdominal pain No diarrhea No urinary symptoms No skin rash + fatigue + myalgias + headache Used OTC meds without relief  Allergies  Review of patient's allergies indicates no known allergies.  Home Medications   Prior to Admission medications   Medication Sig Start Date End Date Taking? Authorizing Provider  simvastatin (ZOCOR) 10 MG tablet Take 10 mg by mouth daily.   Yes Historical Provider, MD  AMBULATORY NON FORMULARY MEDICATION CPAP therapy on 17 cm H2O with a Wide size Resmed Nasal Mask Mirage FX mask and heated humidification. Use nightly for sleep.  Dx: Obstructive Sleep Apnea 02/09/15   Marcial Pacas, DO  azithromycin (ZITHROMAX Z-PAK) 250 MG tablet Take 2 tabs today; then begin one tab once daily for 4 more days. (Rx void after 10/31/15) 10/23/15   Kandra Nicolas, MD  benzonatate (TESSALON) 200 MG capsule Take 1 capsule (200 mg total) by mouth at bedtime. Take as needed for cough 10/23/15   Kandra Nicolas, MD  rosuvastatin (CRESTOR) 20 MG tablet Take 1  tablet (20 mg total) by mouth daily. 09/21/15   Marcial Pacas, DO  trimethoprim-polymyxin b (POLYTRIM) ophthalmic solution Place 2 drops into the right eye every 4 (four) hours. For ten days. 09/20/15   Marcial Pacas, DO  Vilazodone HCl (VIIBRYD) 20 MG TABS One by mouth daily. 01/20/15   Marcial Pacas, DO   Meds Ordered and Administered this Visit  Medications - No data to display  BP 119/80 mmHg  Pulse 110  Temp(Src) 99 F (37.2 C) (Oral)  Ht 5\' 5"  (1.651 m)  Wt 312 lb (141.522 kg)  BMI 51.92 kg/m2  SpO2 94% No data found.   Physical Exam Nursing notes and Vital Signs reviewed. Appearance:  Patient appears stated age, and in no acute distress Eyes:  Pupils are equal, round, and reactive to light and accomodation.  Extraocular movement is intact.  Conjunctivae are not inflamed  Ears:  Canals normal.  Tympanic membranes normal.  Nose:  Mildly congested turbinates.  No sinus tenderness.    Pharynx:  Normal Neck:  Supple.  Tender enlarged posterior/lateral nodes are palpated bilaterally  Lungs:  Clear to auscultation.  Breath sounds are equal.  Moving air well. Heart:  Regular rate and rhythm without murmurs, rubs, or gallops.  Abdomen:  Nontender without masses or hepatosplenomegaly.  Bowel sounds are present.  No CVA or flank tenderness.  Extremities:  No edema.  Skin:  No rash present.   ED Course  Procedures none    MDM   1. Viral URI with cough    There is no evidence of bacterial infection today.  Treat symptomatically for now  Prescription written for Benzonatate (Tessalon) to take at bedtime for night-time cough.  Take plain guaifenesin (1200mg  extended release tabs such as Mucinex) twice daily, with plenty of water, for cough and congestion.  May add Pseudoephedrine (30mg , one or two every 4 to 6 hours) for sinus congestion.  Get adequate rest.   May use Afrin nasal spray (or generic oxymetazoline) twice daily for about 5 days and then discontinue.  Also recommend using saline  nasal spray several times daily and saline nasal irrigation (AYR is a common brand).  Use Flonase nasal spray each morning after using Afrin nasal spray and saline nasal irrigation. Try warm salt water gargles for sore throat.  Stop all antihistamines for now, and other non-prescription cough/cold preparations. May take Ibuprofen 200mg , 4 tabs every 8 hours with food for body aches, fever, etc. Begin Azithromycin if not improving about one week or if persistent fever develops (Given a prescription to hold, with an expiration date)  Follow-up with family doctor if not improving about10 days.     Kandra Nicolas, MD 10/23/15 [phone]

## 2015-12-22 ENCOUNTER — Ambulatory Visit (INDEPENDENT_AMBULATORY_CARE_PROVIDER_SITE_OTHER): Payer: BLUE CROSS/BLUE SHIELD | Admitting: Family Medicine

## 2015-12-22 ENCOUNTER — Encounter: Payer: Self-pay | Admitting: Family Medicine

## 2015-12-22 VITALS — BP 121/79 | HR 89 | Wt 314.0 lb

## 2015-12-22 DIAGNOSIS — F5105 Insomnia due to other mental disorder: Secondary | ICD-10-CM

## 2015-12-22 DIAGNOSIS — F32A Depression, unspecified: Secondary | ICD-10-CM

## 2015-12-22 DIAGNOSIS — G4700 Insomnia, unspecified: Secondary | ICD-10-CM | POA: Diagnosis not present

## 2015-12-22 DIAGNOSIS — F419 Anxiety disorder, unspecified: Secondary | ICD-10-CM | POA: Insufficient documentation

## 2015-12-22 DIAGNOSIS — F329 Major depressive disorder, single episode, unspecified: Secondary | ICD-10-CM

## 2015-12-22 MED ORDER — DOXEPIN HCL 25 MG PO CAPS: 25.0000 mg | ORAL_CAPSULE | Freq: Every day | ORAL | 2 refills | Status: DC

## 2015-12-22 MED ORDER — VILAZODONE HCL 40 MG PO TABS: 40.0000 mg | ORAL_TABLET | Freq: Every day | ORAL | 3 refills | Status: DC

## 2015-12-22 NOTE — Progress Notes (Signed)
CC: Christopher [name] is a 45 y.o. male is here for Insomnia   Subjective: HPI:  For the past month he's been having difficulty staying asleep for more than a few hours. Almost nightly he is waking up with a nightmare, usually its link to whatever he saw on TV before he went to bed. He has difficulty falling back asleep within next few hours. He's also been feeling more withdrawn, coworkers are noticing that is not as interactive with them. He denies any thoughts of harming himself or others but he feels like there is an element of depression as worsening. He denies anything in his life that this change that would influence the insomnia or his worsening depression. He denies any anxiety but does admit to some irritability   Review Of Systems Outlined In HPI  Past Medical History:  Diagnosis Date  . Hyperlipidemia     No past surgical history on file. Family History  Problem Relation Age of Onset  . Prostate cancer      Social History   Social History  . Marital status: Single    Spouse name: N/A  . Number of children: N/A  . Years of education: N/A   Occupational History  . Not on file.   Social History Main Topics  . Smoking status: Never Smoker  . Smokeless tobacco: Never Used  . Alcohol use Yes  . Drug use: No  . Sexual activity: Yes    Partners: Female   Other Topics Concern  . Not on file   Social History Narrative   ** Merged History Encounter **         Objective: BP 121/79   Pulse 89   Wt (!) 314 lb (142.4 kg)   BMI 52.25 kg/m   Vital signs reviewed. General: Alert and Oriented, No Acute Distress HEENT: Pupils equal, round, reactive to light. Conjunctivae clear.  External ears unremarkable.  Moist mucous membranes. Lungs: Clear and comfortable work of breathing, speaking in full sentences without accessory muscle use. Cardiac: Regular rate and rhythm.  Neuro: CN II-XII grossly intact, gait normal. Extremities: No peripheral edema.  Strong peripheral  pulses.  Mental Status: No depression, anxiety, nor agitation. Logical though process. Skin: Warm and dry. Assessment & Plan: Christopher [name] was seen today for insomnia.  Diagnoses and all orders for this visit:  Depression  Insomnia -     Vilazodone HCl (VIIBRYD) 40 MG TABS; Take 1 tablet (40 mg total) by mouth daily. -     doxepin (SINEQUAN) 25 MG capsule; Take 1 capsule (25 mg total) by mouth at bedtime.   Insomnia and depression: Increasing Viibryd, start doxepin. Discussed using the latter of these 2 for at least the next month and then a trial off of it. Impression: Uncontrolled chronic condition  Discussed with this patient that I will be resigning from my position here with [facility] in September in order to stay with my family who will be moving to [facility]. I let him know about the providers that are still accepting patients and I feel that this individual will be under great care if he/she stays here with [facility].  Return in about 3 months (around 03/23/2016) for Dr. Georgina Snell Follow Up Mood.

## 2016-01-29 ENCOUNTER — Emergency Department (INDEPENDENT_AMBULATORY_CARE_PROVIDER_SITE_OTHER): Payer: BLUE CROSS/BLUE SHIELD

## 2016-01-29 ENCOUNTER — Encounter: Payer: Self-pay | Admitting: *Deleted

## 2016-01-29 ENCOUNTER — Emergency Department
Admission: EM | Admit: 2016-01-29 | Discharge: 2016-01-29 | Disposition: A | Discharge status: Home / Self Care | Payer: BLUE CROSS/BLUE SHIELD | Source: Home / Self Care | Attending: Family Medicine | Admitting: Family Medicine

## 2016-01-29 DIAGNOSIS — M47816 Spondylosis without myelopathy or radiculopathy, lumbar region: Secondary | ICD-10-CM | POA: Diagnosis not present

## 2016-01-29 DIAGNOSIS — M5442 Lumbago with sciatica, left side: Secondary | ICD-10-CM | POA: Diagnosis not present

## 2016-01-29 MED ORDER — PREDNISONE 20 MG PO TABS: ORAL_TABLET | ORAL | 0 refills | Status: DC

## 2016-01-29 MED ORDER — HYDROCODONE-ACETAMINOPHEN 5-325 MG PO TABS: ORAL_TABLET | ORAL | 0 refills | Status: DC

## 2016-01-29 NOTE — ED Triage Notes (Signed)
Pt c/o mild LBP x 1 wk with worsening s/s x 1 day. He reports that the pain radiates down his LT leg. Denies injury. He took IBF and applied heat last night without relief.

## 2016-01-29 NOTE — ED Provider Notes (Signed)
[name]    CSN: [id number] Arrival date & time: 01/29/16  [id number]  First Provider Contact:  First MD Initiated Contact with Patient 01/29/16 [phone]        History   Chief Complaint Chief Complaint  Patient presents with  . Back Pain    HPI YADRIAN HIBMA is a 45 y.o. male.   Patient complains of vague left hip soreness for about one week.  Yesterday afternoon he developed more severe non-radiating left lower back ache.   He denies bowel or bladder dysfunction, and no saddle numbness.    The history is provided by the patient.  Back Pain  Location:  Lumbar spine Quality:  Aching Radiates to:  Does not radiate Pain severity:  Moderate Pain is:  Same all the time Onset quality:  Gradual Timing:  Constant Progression:  Worsening Chronicity:  New Context: recent injury   Context: not lifting heavy objects and not occupational injury   Relieved by:  Nothing Worsened by:  Bending, movement and ambulation Ineffective treatments:  NSAIDs Associated symptoms: no abdominal pain, no bladder incontinence, no bowel incontinence, no chest pain, no dysuria, no fever, no leg pain, no numbness, no perianal numbness, no tingling, no weakness and no weight loss   Risk factors: obesity     Past Medical History:  Diagnosis Date  . Hyperlipidemia     Patient Active Problem List   Diagnosis Date Noted  . Lumbar degenerative disc disease 02/05/2016  . Insomnia 12/22/2015  . Pink eye 09/20/2015  . Depression 06/20/2014  . OSA (obstructive sleep apnea) 12/02/2013  . Obesity 12/02/2013  . Family history of prostate cancer 12/02/2013  . Hyperlipidemia     History reviewed. No pertinent surgical history.     Home Medications    Prior to Admission medications   Medication Sig Start Date End Date Taking? Authorizing Provider  AMBULATORY NON FORMULARY MEDICATION CPAP therapy on 17 cm H2O with a Wide size Resmed Nasal Mask Mirage FX mask and heated humidification. Use  nightly for sleep.  Dx: Obstructive Sleep Apnea 02/09/15   Marcial Pacas, DO  doxepin (SINEQUAN) 25 MG capsule Take 1 capsule (25 mg total) by mouth at bedtime. 12/22/15   Marcial Pacas, DO  meloxicam (MOBIC) 15 MG tablet One tab PO qAM with breakfast for 2 weeks, then daily prn pain. 02/05/16   Silverio Decamp, MD  predniSONE (STERAPRED UNI-PAK 48 TAB) 10 MG (48) TBPK tablet Take by mouth daily. Use as directed for taper 02/05/16   Silverio Decamp, MD  rosuvastatin (CRESTOR) 20 MG tablet Take 1 tablet (20 mg total) by mouth daily. 09/21/15   Sean Hommel, DO  simvastatin (ZOCOR) 10 MG tablet Take 10 mg by mouth daily.    Historical Provider, MD  Vilazodone HCl (VIIBRYD) 40 MG TABS Take 1 tablet (40 mg total) by mouth daily. 12/22/15   Marcial Pacas, DO    Family History Family History  Problem Relation Age of Onset  . Prostate cancer      Social History Social History  Substance Use Topics  . Smoking status: Never Smoker  . Smokeless tobacco: Never Used  . Alcohol use Yes     Allergies   Review of patient's allergies indicates no known allergies.   Review of Systems Review of Systems  Constitutional: Negative for fever and weight loss.  Cardiovascular: Negative for chest pain.  Gastrointestinal: Negative for abdominal pain and bowel incontinence.  Genitourinary: Negative for bladder incontinence and dysuria.  Musculoskeletal: Positive for back pain.  Neurological: Negative for tingling, weakness and numbness.  All other systems reviewed and are negative.    Physical Exam Triage Vital Signs ED Triage Vitals  Enc Vitals Group     BP 01/29/16 0817 123/81     Pulse Rate 01/29/16 0817 79     Resp 01/29/16 0817 18     Temp 01/29/16 0817 98.2 F (36.8 C)     Temp Source 01/29/16 0817 Oral     SpO2 01/29/16 0817 96 %     Weight 01/29/16 0818 (!) 305 lb (138.3 kg)     Height 01/29/16 0818 5\' 8"  (1.727 m)     Head Circumference --      Peak Flow --      Pain Score  01/29/16 0819 7     Pain Loc --      Pain Edu? --      Excl. in [place]? --    No data found.   Updated Vital Signs BP 123/81 (BP Location: Left Arm)   Pulse 79   Temp 98.2 F (36.8 C) (Oral)   Resp 18   Ht 5\' 8"  (1.727 m)   Wt (!) 305 lb (138.3 kg)   SpO2 96%   BMI 46.38 kg/m   Visual Acuity Right Eye Distance:   Left Eye Distance:   Bilateral Distance:    Right Eye Near:   Left Eye Near:    Bilateral Near:     Physical Exam  Constitutional: He appears well-developed and well-nourished. No distress.  HENT:  Head: Normocephalic.  Eyes: Pupils are equal, round, and reactive to light.  Neck: Normal range of motion.  Cardiovascular: Normal heart sounds.   Pulmonary/Chest: Breath sounds normal.  Abdominal: Soft. There is no tenderness.  Musculoskeletal:       Lumbar back: He exhibits decreased range of motion. He exhibits no tenderness, no swelling and no edema.       Back:  Back:  Range of motion is decreased. Patient's left back pain is outlined on diagram, but there is no tenderness to palpation.  Straight leg raising test is positive on the left at 30 degrees.  Sitting knee extension test is positive.  Strength and sensation in the lower extremities is normal.  Patellar and achilles reflexes are normal   Neurological: He is alert.  Skin: Skin is warm and dry. No rash noted.  Nursing note and vitals reviewed.    UC Treatments / Results  Labs (all labs ordered are listed, but only abnormal results are displayed) Labs Reviewed - No data to display  EKG  EKG Interpretation None       Radiology Study Result   CLINICAL DATA:  Back pain.  Radiation to left leg.  No known injury.  EXAM: LUMBAR SPINE - COMPLETE 4+ VIEW  COMPARISON:  No recent prior.  FINDINGS: No acute bony abnormality identified. No evidence of fracture or dislocation. Diffuse mild degenerative change.  IMPRESSION: Diffuse mild degenerative change. No evidence of fracture  or dislocation.   Electronically Signed   By: Marcello Moores  Register   On: 01/29/2016 08:55     Procedures Procedures (including critical care time)  Medications Ordered in UC Medications - No data to display   Initial Impression / Assessment and Plan / UC Course  I have reviewed the triage vital signs and the nursing notes.  Pertinent labs & imaging results that were available during my care of the patient were reviewed by me  and considered in my medical decision making (see chart for details).  Clinical Course  Begin prednisone burst/taper.  Lortab for pain Apply ice pack for 20 to 30 minutes, 3 to 4 times daily  Continue until pain decreases.  Begin back range of motion and stretching exercises as tolerated.  Followup with Dr. Aundria Mems or Dr. Lynne Leader ([facility]) if not improving about two weeks.     Final Clinical Impressions(s) / UC Diagnoses   Final diagnoses:  Left-sided low back pain with left-sided sciatica    New Prescriptions Discharge Medication List as of 01/29/2016  9:30 AM    START taking these medications   Details  HYDROcodone-acetaminophen (NORCO/VICODIN) 5-325 MG tablet Take one by mouth at bedtime as needed for pain, Print    predniSONE (DELTASONE) 20 MG tablet Take one tab by mouth twice daily for 5 days, then one daily for 3 days. Take with food., Print         Kandra Nicolas, MD 02/06/16 2203

## 2016-01-29 NOTE — Discharge Instructions (Addendum)
Apply ice pack for 20 to 30 minutes, 3 to 4 times daily  Continue until pain decreases.  Begin back range of motion and stretching exercises as tolerated. 

## 2016-01-29 NOTE — ED Notes (Signed)
Offered Tylenol or IBF. Pt declined. He would like to see the doctor first.

## 2016-02-05 ENCOUNTER — Ambulatory Visit (INDEPENDENT_AMBULATORY_CARE_PROVIDER_SITE_OTHER): Payer: BLUE CROSS/BLUE SHIELD | Admitting: Sports Medicine

## 2016-02-05 ENCOUNTER — Encounter: Payer: Self-pay | Admitting: Sports Medicine

## 2016-02-05 DIAGNOSIS — M5136 Other intervertebral disc degeneration, lumbar region: Secondary | ICD-10-CM

## 2016-02-05 DIAGNOSIS — M51369 Other intervertebral disc degeneration, lumbar region without mention of lumbar back pain or lower extremity pain: Secondary | ICD-10-CM | POA: Insufficient documentation

## 2016-02-05 MED ORDER — MELOXICAM 15 MG PO TABS: ORAL_TABLET | ORAL | 3 refills | Status: DC

## 2016-02-05 MED ORDER — PREDNISONE 10 MG (48) PO TBPK: ORAL_TABLET | Freq: Every day | ORAL | 0 refills | Status: DC

## 2016-02-05 NOTE — Assessment & Plan Note (Signed)
Left upper lumbar radicular symptoms are predominantly discogenic axial back pain. Adding an additional taper course of prednisone, formal physical therapy. Meloxicam. Return to see me in one month.  MRI for interventional planning if no better.

## 2016-02-05 NOTE — Progress Notes (Signed)
   Subjective:    I'm seeing this patient as a consultation for:  Dr. Theone Murdoch  CC: low back pain and L leg pain   HPI: 45 yo M presenting with two weeks of low back pain that radiates down his L thigh.  Patients says he was in his normal state of health until one week ago when he woke up in the morning with throbbing back pain with associated "muscle spasms" down his L leg, but ended before reaching his knee.  Pain was so bad that it was difficult to walk.   He visited urgent care and was diagnosed with a "pinched nerve" and given a course of steroids and Vicodin for pain.  Over the past week, pain has gotten 50% better but is still waxing and waning.  It continues to radiate down his L leg.  He says pain is worse with flexion and getting up out of a chair.  Not worse with coughing.  No red flag symptoms - no bowel or bladder incontinence, saddle anesthesia.  He wants to know what else can be done for pain.        Past medical history:  Negative.  See flowsheet/record as well for more information.  Surgical history: Negative.  See flowsheet/record as well for more information.  Family history: Negative.  See flowsheet/record as well for more information.  Social history: Negative.  See flowsheet/record as well for more information.  Allergies, and medications have been entered into the medical record, reviewed, and no changes needed.   Review of Systems: No headache, visual changes, nausea, vomiting, diarrhea, constipation, dizziness, abdominal pain, skin rash, fevers, chills, night sweats, weight loss, swollen lymph nodes, body aches,  chest pain, shortness of breath, mood changes, visual or auditory hallucinations.   Objective:   General: Well Developed, well nourished, and in no acute distress.  Neuro/Psych: Alert and oriented x3, extra-ocular muscles intact, able to move all 4 extremities, sensation grossly intact. Skin: Warm and dry, no rashes noted.  Respiratory: Not using accessory  muscles, speaking in full sentences, trachea midline.  Cardiovascular: Pulses palpable, no extremity edema. Abdomen: Does not appear distended. Back Exam:  Inspection: Unremarkable  Motion: Flexion 45 deg, Extension 45 deg, Side Bending to 45 deg bilaterally,  Rotation to 45 deg bilaterally  SLR laying: Negative  XSLR laying: Negative  Palpable tenderness: TTP over L SI joint  FABER: positive  Sensory change: Gross sensation intact to all lumbar and sacral dermatomes.  Reflexes: 2+ at both patellar tendons, 2+ at achilles tendons, Babinski's downgoing.  Strength at foot  Plantar-flexion: 5/5 Dorsi-flexion: 5/5 Eversion: 5/5 Inversion: 5/5  Leg strength  Quad: 5/5 Hamstring: 5/5 Hip flexor: 5/5 Hip abductors: 5/5  Gait unremarkable.     Impression and Recommendations:   This case required medical decision making of moderate complexity.  1. Low back pain with lumbar radiculopathy: likely lumbar degenerative disc disease (X-ray showed diffuse mild degenerative change.) -12 day taper course of steroids -Meloxicam daily -Formal PT -Light work restrictions for next month  -Return in 1 month if pain persists - will consider MRI at that time

## 2016-02-16 ENCOUNTER — Ambulatory Visit (INDEPENDENT_AMBULATORY_CARE_PROVIDER_SITE_OTHER): Payer: BLUE CROSS/BLUE SHIELD | Admitting: Rehabilitative and Restorative Service Providers"

## 2016-02-16 ENCOUNTER — Encounter: Payer: Self-pay | Admitting: Rehabilitative and Restorative Service Providers"

## 2016-02-16 DIAGNOSIS — M545 Low back pain: Secondary | ICD-10-CM

## 2016-02-16 DIAGNOSIS — R29898 Other symptoms and signs involving the musculoskeletal system: Secondary | ICD-10-CM

## 2016-02-16 NOTE — Therapy (Signed)
[place], [place], [address] Phone: [phone]   Fax:  [phone]  Physical Therapy Evaluation  Patient Details  Name: Christopher [name] MRN: [id number] Date of Birth: 01-24-1971 Referring Provider: Dr Dianah Field  Encounter Date: 02/16/2016      PT End of Session - 02/16/16 0928    Visit Number 1   Number of Visits 12   Date for PT Re-Evaluation 04/01/16   PT Start Time 0845   PT Stop Time 0943   PT Time Calculation (min) 58 min   Activity Tolerance Patient tolerated treatment well      Past Medical History:  Diagnosis Date  . Hyperlipidemia     History reviewed. No pertinent surgical history.  There were no vitals filed for this visit.       Subjective Assessment - 02/16/16 0843    Subjective Patient reports that he had a sharp pain in the lower back ~3 weeks ago. He had difficulty moving and had to take a week off from work with intense pain. He was seen in Urgent Care 01/29/16 and diagnosed with a "pinched nerve". He continued to have some pain and was seen by Dr T for follow up. He has no known injury. He continues to have some pain in the low back and pain down in the Lt LE.    Pertinent History Denies any medical problems; PTSD from [employer] service; Fx Rt ankle > 10 years ago   How long can you sit comfortably? 25 min    How long can you stand comfortably? 25 min    How long can you walk comfortably? 40 min    Diagnostic tests xrays - DDD    Patient Stated Goals get rid of the pain and make the lifestyle changes he needs to avoid surgery    Currently in Pain? Yes   Pain Score 3    Pain Location Back   Pain Orientation Left;Lower   Pain Descriptors / Indicators Aching   Pain Type Acute pain   Pain Radiating Towards down the lateral Lt thigh to knee    Pain Onset 1 to 4 weeks ago   Pain Frequency Intermittent   Aggravating Factors  prolonged sitting; standing; walking   Pain  Relieving Factors pain meds; ice; walk around to loosen back up; has a back brace that he uses sometimes             [facility] PT Assessment - 02/16/16 0001      Assessment   Medical Diagnosis Lumbar DDD; Lt sciatica   Referring Provider Dr Dianah Field   Onset Date/Surgical Date 01/28/16   Hand Dominance Right   Next MD Visit 03/01/16   Prior Therapy none     Precautions   Precautions None     Balance Screen   Has the patient fallen in the past 6 months No   Has the patient had a decrease in activity level because of a fear of falling?  No   Is the patient reluctant to leave their home because of a fear of falling?  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; sitting at computer 12 hr/day -  2 on 2 off 5 on 5 off days     Observation/Other Assessments   Focus on Therapeutic Outcomes (FOTO)  33% limitation      Sensation   Additional Comments sensation WNL's per pt report      Posture/Postural Control   Posture Comments head forward; shoulders forward; increaesd thoracic kyphosis; arms in IR at sides;      AROM   Lumbar Flexion 60%  pull and discomfort    Lumbar Extension 50%   Lumbar - Right Side Bend 80%   Lumbar - Left Side Bend 80%   Lumbar - Right Rotation 50%   Lumbar - Left Rotation 60%     Strength   Overall Strength Comments 5/5 bilat LE's      Flexibility   Hamstrings tight Rt ~ 70 deg; Lt 65 deg   Quadriceps tight bilat 90 deg   ITB tight bilat    Piriformis tight Lt (painful) tight Rt      Palpation   Spinal mobility tight - no pain    Palpation comment WFL's      Special Tests    Special Tests --  SLR/firgure 4 (-)                            PT Education  - 02/16/16 0919    Education provided Yes   Education Details HEP; posture/back care; TENS info   Person(s) Educated Patient   Methods Explanation;Demonstration;Tactile cues;Verbal cues;Handout   Comprehension Verbalized understanding;Returned demonstration;Verbal cues required;Tactile cues required             PT Long Term Goals - 02/16/16 0932      PT LONG TERM GOAL #1   Title Improve LE flexibility and ROM through quads; hamstrings; IT band and piriformis allowing pt improved mobility to decrease pain 04/04/16   Time 6   Period Weeks   Status New     PT LONG TERM GOAL #2   Title Improve core stability allowing patient to progress to independent HEP for back care 04/04/16   Time 6   Period Weeks   Status New     PT LONG TERM GOAL #3   Title Improve functional activity level allowing pt to sit; stand; and walk 30-60 min as needed for functional activites 04/04/16   Time 6   Period Weeks   Status New     PT LONG TERM GOAL #4   Title Independent in HEP 04/04/16   Time 6   Period Weeks   Status New     PT LONG TERM GOAL #5   Title Improve FOTO to </= 17% limitation 04/04/16   Time 6   Period Weeks   Status New               Plan - 02/16/16 [id number]    Clinical Impression Statement Patient presents with ~ 3 week history of LBP and Lt LE pain with no known injury. He has limited trunk and LE mobility; pain with piriformis stretch; limited functional activity level; LBP and Lt LE radicular pain lateral thigh to knee. He has signs and symptoms suggestive of lumbar radiculopathy and piriformis tightness.    Rehab Potential Good   PT Frequency 2x / week   PT Duration 6 weeks   PT Treatment/Interventions Patient/family education;ADLs/Self Care Home Management;Cryotherapy;Electrical Stimulation;Iontophoresis 4mg /ml Dexamethasone;Moist Heat;Traction;Ultrasound;Dry needling;Manual techniques;Therapeutic activities;Therapeutic exercise   PT Next Visit Plan progress with  core stabilization;  address piriformis tightness; manual work and modalities as indicated    Oncologist with Plan of Care Patient      Patient will benefit from skilled therapeutic intervention in order to improve the following deficits and impairments:  Postural dysfunction, Improper body mechanics, Pain, Obesity, Decreased mobility, Decreased range of motion, Decreased activity tolerance  Visit Diagnosis: Acute left-sided low back pain, with sciatica presence unspecified - Plan: PT plan of care cert/re-cert  Other symptoms and signs involving the musculoskeletal system - Plan: PT plan of care cert/re-cert     Problem List Patient Active Problem List   Diagnosis Date Noted  . Lumbar degenerative disc disease 02/05/2016  . Insomnia 12/22/2015  . Pink eye 09/20/2015  . Depression 06/20/2014  . OSA (obstructive sleep apnea) 12/02/2013  . Obesity 12/02/2013  . Family history of prostate cancer 12/02/2013  . Hyperlipidemia     Nyesha Cliff Nilda Simmer PT, MPH  02/16/2016, 9:41 AM  [facility] [place], [place], [address] Phone: [phone]   Fax:  [phone]  Name: DERWYN HEIDEBRECHT MRN: [id number] Date of Birth: 02-Dec-1970

## 2016-02-16 NOTE — Patient Instructions (Addendum)
Abdominal Bracing With Pelvic Floor (Hook-Lying)    With neutral spine, tighten pelvic floor and abdominals sucking belly button to back bone; tighten muscles in low back at waist. Hold 10-20 sec  Repeat __10_ times. Do _several__ times a day. Progress to do this in sitting; standing; walking and functional activities.  Trunk: Prone Extension (Press-Ups)    Lie on stomach on firm, flat surface. Relax bottom and legs. Raise chest in air with elbows straight. Keep hips flat on surface, sag stomach. Hold __2-3__ seconds. Repeat __10__ times. Do __2-3_ sessions per day. CAUTION: Movement should be gentle and slow.   HIP: Hamstrings - Supine   Place strap around foot. Raise leg up, keeping knee straight.  Bend opposite knee to protect back if indicated. Hold 30 seconds. 3 reps per set, 2-3 sets per day     Outer Hip Stretch: Reclined IT Band Stretch (Strap)   Strap around one foot, pull leg across body until you feel a pull or stretch, with shoulders on mat. Hold for 30 seconds. Repeat 3 times each leg. 2-3 times/day.  Piriformis Stretch   Lying on back, pull right knee toward opposite shoulder. Hold 30 seconds. Repeat 3 times. Do 2-3 sessions per day   Quads / HF, Prone   Lie face down. Grasp one ankle with same-side hand. Use towel if needed to reach. Gently pull foot toward buttock.  Hold 30 seconds. Repeat 3 times per session. Do 2-3 sessions per day.   Sleeping on Back  Place pillow under knees. A pillow with cervical support and a roll around waist are also helpful. Copyright  VHI. All rights reserved.  Sleeping on Side Place pillow between knees. Use cervical support under neck and a roll around waist as needed. Copyright  VHI. All rights reserved.   Sleeping on Stomach   If this is the only desirable sleeping position, place pillow under lower legs, and under stomach or chest as needed.  Posture - Sitting   Sit upright, head facing forward. Try using  a roll to support lower back. Keep shoulders relaxed, and avoid rounded back. Keep hips level with knees. Avoid crossing legs for long periods. Stand to Sit / Sit to Stand   To sit: Bend knees to lower self onto front edge of chair, then scoot back on seat. To stand: Reverse sequence by placing one foot forward, and scoot to front of seat. Use rocking motion to stand up.   Work Height and Reach  Ideal work height is no more than 2 to 4 inches below elbow level when standing, and at elbow level when sitting. Reaching should be limited to arm's length, with elbows slightly bent.  Bending  Bend at hips and knees, not back. Keep feet shoulder-width apart.    Posture - Standing   Good posture is important. Avoid slouching and forward head thrust. Maintain curve in low back and align ears over shoul- ders, hips over ankles.  Alternating Positions   Alternate tasks and change positions frequently to reduce fatigue and muscle tension. Take rest breaks. Computer Work   Position work to Programmer, multimedia. Use proper work and seat height. Keep shoulders back and down, wrists straight, and elbows at right angles. Use chair that provides full back support. Add footrest and lumbar roll as needed.  Getting Into / Out of Car  Lower self onto seat, scoot back, then bring in one leg at a time. Reverse sequence to get out.  Dressing  Lie on back  to pull socks or slacks over feet, or sit and bend leg while keeping back straight.    Housework - Sink  Place one foot on ledge of cabinet under sink when standing at sink for prolonged periods.   Pushing / Pulling  Pushing is preferable to pulling. Keep back in proper alignment, and use leg muscles to do the work.  Deep Squat   Squat and lift with both arms held against upper trunk. Tighten stomach muscles without holding breath. Use smooth movements to avoid jerking.  Avoid Twisting   Avoid twisting or bending back. Pivot around using foot  movements, and bend at knees if needed when reaching for articles.  Carrying Luggage   Distribute weight evenly on both sides. Use a cart whenever possible. Do not twist trunk. Move body as a unit.   Lifting Principles .Maintain proper posture and head alignment. .Slide object as close as possible before lifting. .Move obstacles out of the way. .Test before lifting; ask for help if too heavy. .Tighten stomach muscles without holding breath. .Use smooth movements; do not jerk. .Use legs to do the work, and pivot with feet. .Distribute the work load symmetrically and close to the center of trunk. .Push instead of pull whenever possible.   Ask For Help   Ask for help and delegate to others when possible. Coordinate your movements when lifting together, and maintain the low back curve.  Log Roll   Lying on back, bend left knee and place left arm across chest. Roll all in one movement to the right. Reverse to roll to the left. Always move as one unit. Housework - Sweeping  Use long-handled equipment to avoid stooping.   Housework - Wiping  Position yourself as close as possible to reach work surface. Avoid straining your back.  Laundry - Unloading Wash   To unload small items at bottom of washer, lift leg opposite to arm being used to reach.  [place] close to area to be raked. Use arm movements to do the work. Keep back straight and avoid twisting.     Cart  When reaching into cart with one arm, lift opposite leg to keep back straight.   Getting Into / Out of Bed  Lower self to lie down on one side by raising legs and lowering head at the same time. Use arms to assist moving without twisting. Bend both knees to roll onto back if desired. To sit up, start from lying on side, and use same move-ments in reverse. Housework - Vacuuming  Hold the vacuum with arm held at side. Step back and forth to move it, keeping head up. Avoid twisting.   Laundry -  IT consultant so that bending and twisting can be avoided.   Laundry - Unloading Dryer  Squat down to reach into clothes dryer or use a reacher.  Gardening - Weeding / Probation officer or Kneel. Knee pads may be helpful.                    TENS UNIT: This is helpful for muscle pain and spasm.   Search and Purchase a TENS 7000 2nd edition at www.tenspros.com. It should be less than $30.     TENS unit instructions: Do not shower or bathe with the unit on Turn the unit off before removing electrodes or batteries If the electrodes lose stickiness add a drop of water to the electrodes after they are disconnected from  the unit and place on plastic sheet. If you continued to have difficulty, call the TENS unit company to purchase more electrodes. Do not apply lotion on the skin area prior to use. Make sure the skin is clean and dry as this will help prolong the life of the electrodes. After use, always check skin for unusual red areas, rash or other skin difficulties. If there are any skin problems, does not apply electrodes to the same area. Never remove the electrodes from the unit by pulling the wires. Do not use the TENS unit or electrodes other than as directed. Do not change electrode placement without consultating your therapist or physician. Keep 2 fingers with between each electrode.

## 2016-02-20 ENCOUNTER — Ambulatory Visit (INDEPENDENT_AMBULATORY_CARE_PROVIDER_SITE_OTHER): Payer: BLUE CROSS/BLUE SHIELD | Admitting: Physical Therapy

## 2016-02-20 DIAGNOSIS — M545 Low back pain: Secondary | ICD-10-CM | POA: Diagnosis not present

## 2016-02-20 DIAGNOSIS — R29898 Other symptoms and signs involving the musculoskeletal system: Secondary | ICD-10-CM | POA: Diagnosis not present

## 2016-02-20 NOTE — Therapy (Addendum)
[place], [place], [address] Phone: [phone]   Fax:  [phone]  Physical Therapy Treatment  Patient Details  Name: Christopher [name] MRN: [id number] Date of Birth: November 07, 1970 Referring Provider: Dr. Clydie Braun  Encounter Date: 02/20/2016      PT End of Session - 02/20/16 1145    Visit Number 2   Number of Visits 12   Date for PT Re-Evaluation 04/01/16   PT Start Time 1102   PT Stop Time 1145   PT Time Calculation (min) 43 min   Activity Tolerance Patient tolerated treatment well;No increased pain   Behavior During Therapy WFL for tasks assessed/performed      Past Medical History:  Diagnosis Date  . Hyperlipidemia     No past surgical history on file.  There were no vitals filed for this visit.      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  He has not been lifting anything. Pt hasn't taken any pain medication for 4 days.   He has been walking on track for 30 min for last 3 days; is interested in keeping back loose.    Patient Stated Goals get rid of the pain and make the lifestyle changes he needs to avoid surgery    Currently in Pain? No/denies            [facility] PT Assessment - 02/20/16 0001      Assessment   Medical Diagnosis Lumbar DDD; Lt sciatica   Referring Provider Dr. Clydie Braun   Onset Date/Surgical Date 01/28/16   Hand Dominance Right   Next MD Visit 03/01/16   Prior Therapy none     Flexibility   Quadriceps tight bilat 90 deg            [facility] Adult PT Treatment/Exercise - 02/20/16 0001      Exercises   Exercises Knee/Hip;Lumbar     Lumbar Exercises: Stretches   Passive Hamstring Stretch 30 seconds;3 reps  seated, straight back   Standing Extension 3 reps  5 sec hold   Press Ups 5 reps;10 seconds   Quad Stretch 3 reps;30 seconds   Quad  Stretch Limitations d   ITB Stretch 2 reps;20 seconds   Piriformis Stretch 2 reps;20 seconds     Lumbar Exercises: Aerobic   Stationary Bike NuSTep L5: 5 min      Lumbar Exercises: Supine   Ab Set 10 reps;5 seconds   Bent Knee Raise 10 reps  with ab set     Educated pt on engaging core with sit to / from stand to assist with pain reduction with transitions in and out of car.         PT Long Term Goals - 02/20/16 1125      PT LONG TERM GOAL #1   Title Improve LE flexibility and ROM through quads; hamstrings; IT band and piriformis allowing pt improved mobility to decrease pain 04/04/16   Time 6   Period Weeks   Status On-going     PT LONG TERM GOAL #2   Title Improve core stability allowing patient to progress to independent HEP for back care 04/04/16     PT LONG TERM GOAL #3   Title Improve functional activity level allowing pt to sit; stand; and walk 30-60 min as needed for functional activites 04/04/16   Time 6  Period Weeks   Status Achieved     PT LONG TERM GOAL #4   Title Independent in HEP 04/04/16   Time 6   Period Weeks   Status On-going     PT LONG TERM GOAL #5   Title Improve FOTO to </= 17% limitation 04/04/16   Time 6   Period Weeks   Status On-going               Plan - 02/20/16 1146    Clinical Impression Statement Pt tolerated all exercises well without any production of pain.  Pt continues with tightness in LE.  Pt has started walking program and is tolerating 30 min without any symptoms. Pt has met LTG # 3   Rehab Potential Good   PT Frequency 2x / week   PT Duration 6 weeks   PT Treatment/Interventions Patient/family education;ADLs/Self Care Home Management;Cryotherapy;Electrical Stimulation;Iontophoresis 4m/ml Dexamethasone;Moist Heat;Traction;Ultrasound;Dry needling;Manual techniques;Therapeutic activities;Therapeutic exercise   PT Next Visit Plan Address walk/jog progression; progress core stabilization.     Consulted and Agree  with Plan of Care Patient      Patient will benefit from skilled therapeutic intervention in order to improve the following deficits and impairments:  Postural dysfunction, Improper body mechanics, Pain, Obesity, Decreased mobility, Decreased range of motion, Decreased activity tolerance  Visit Diagnosis: Acute left-sided low back pain, with sciatica presence unspecified  Other symptoms and signs involving the musculoskeletal system     Problem List Patient Active Problem List   Diagnosis Date Noted  . Lumbar degenerative disc disease 02/05/2016  . Insomnia 12/22/2015  . Pink eye 09/20/2015  . Depression 06/20/2014  . OSA (obstructive sleep apnea) 12/02/2013  . Obesity 12/02/2013  . Family history of prostate cancer 12/02/2013  . Hyperlipidemia    JKerin Perna PTA 02/20/16 11:49 AM  [facility] [place] [address]Phone: [phone]  Fax:  [phone] Name: Christopher GUIFFREMRN: [id number] of Birth: 304-Apr-1972 PHYSICAL THERAPY DISCHARGE SUMMARY  Visits from Start of Care: 2  Current functional level related to goals / functional outcomes: See last PT progress note for therapy discharge status.    Remaining deficits: Unchanged    Education / Equipment: Initial HEP  Plan: Patient agrees to discharge.  Patient goals were not met. Patient is being discharged due to not returning since the last visit.  ?????    Christopher P. HHelene KelpPT, MPH 04/03/16 3:38 PM

## 2016-02-20 NOTE — Patient Instructions (Signed)
KNEE: Quadriceps - Prone    Place strap around ankle. Bring ankle toward buttocks. Press hip into surface. Hold _30__ seconds. __2_ reps per set, _1-2__ sets per day.   WALKING  Walking is a great form of exercise to increase your strength, endurance and overall fitness.  A walking program can help you start slowly and gradually build endurance as you go.  Everyone's ability is different, so each person's starting point will be different.  You do not have to follow them exactly.  The are just samples. You should simply find out what's right for you and stick to that program.   In the beginning, you'll start off walking 2-3 times a day for short distances.  As you get stronger, you'll be walking further at just 1-2 times per day.  A. You Can Walk For A Certain Length Of Time Each Day    Walk 5 minutes 3 times per day.  Increase 2 minutes every 2 days (3 times per day).  Work up to 25-30 minutes (1-2 times per day).   Example:   Day 1-2 5 minutes 3 times per day   Day 7-8 12 minutes 2-3 times per day   Day 13-14 25 minutes 1-2 times per day  B. You Can Walk For a Certain Distance Each Day     Distance can be substituted for time.    Example:   3 trips to mailbox (at road)   3 trips to corner of block   3 trips around the block  C. Go to local [employer] and use the track.    Walk for distance ____ around track  Or time ____ minutes  D. Walk ____ Jog ____ Run ___  Please only do the exercises that your therapist has initialed and dated  [place] [facility] [place], [place] [address]  [phone] (office) [phone] (fax)

## 2016-03-01 ENCOUNTER — Encounter: Payer: Self-pay | Admitting: Rehabilitative and Restorative Service Providers"

## 2016-03-01 ENCOUNTER — Ambulatory Visit: Payer: Self-pay | Admitting: Sports Medicine

## 2016-03-01 DIAGNOSIS — F4310 Post-traumatic stress disorder, unspecified: Secondary | ICD-10-CM | POA: Diagnosis not present

## 2016-03-01 DIAGNOSIS — Z713 Dietary counseling and surveillance: Secondary | ICD-10-CM | POA: Diagnosis not present

## 2016-03-01 DIAGNOSIS — E785 Hyperlipidemia, unspecified: Secondary | ICD-10-CM | POA: Diagnosis not present

## 2016-03-01 DIAGNOSIS — E669 Obesity, unspecified: Secondary | ICD-10-CM | POA: Diagnosis not present

## 2016-03-01 DIAGNOSIS — Z23 Encounter for immunization: Secondary | ICD-10-CM | POA: Diagnosis not present

## 2016-05-06 DIAGNOSIS — R7303 Prediabetes: Secondary | ICD-10-CM

## 2016-05-06 HISTORY — DX: Prediabetes: R73.03

## 2016-05-17 DIAGNOSIS — F333 Major depressive disorder, recurrent, severe with psychotic symptoms: Secondary | ICD-10-CM | POA: Diagnosis not present

## 2016-05-17 DIAGNOSIS — F4310 Post-traumatic stress disorder, unspecified: Secondary | ICD-10-CM | POA: Diagnosis not present

## 2016-05-27 DIAGNOSIS — G4733 Obstructive sleep apnea (adult) (pediatric): Secondary | ICD-10-CM | POA: Diagnosis not present

## 2016-05-30 ENCOUNTER — Ambulatory Visit (INDEPENDENT_AMBULATORY_CARE_PROVIDER_SITE_OTHER): Payer: BLUE CROSS/BLUE SHIELD | Admitting: Physician Assistant

## 2016-05-30 VITALS — BP 119/74 | HR 65 | Wt 320.0 lb

## 2016-05-30 DIAGNOSIS — F4312 Post-traumatic stress disorder, chronic: Secondary | ICD-10-CM

## 2016-05-30 DIAGNOSIS — F4310 Post-traumatic stress disorder, unspecified: Secondary | ICD-10-CM | POA: Insufficient documentation

## 2016-05-30 DIAGNOSIS — Z0000 Encounter for general adult medical examination without abnormal findings: Secondary | ICD-10-CM

## 2016-05-30 DIAGNOSIS — F331 Major depressive disorder, recurrent, moderate: Secondary | ICD-10-CM | POA: Insufficient documentation

## 2016-05-30 DIAGNOSIS — E782 Mixed hyperlipidemia: Secondary | ICD-10-CM

## 2016-05-30 DIAGNOSIS — F515 Nightmare disorder: Secondary | ICD-10-CM | POA: Insufficient documentation

## 2016-05-30 LAB — LIPID PANEL
CHOL/HDL RATIO: 3.3 ratio (ref <5.0)
Cholesterol: 137 mg/dL (ref <200)
HDL: 42 mg/dL (ref >40)
LDL Cholesterol: 70 mg/dL (ref <100)
Triglycerides: 123 mg/dL (ref <150)
VLDL: 25 mg/dL (ref <30)

## 2016-05-30 LAB — COMPREHENSIVE METABOLIC PANEL
ALBUMIN: 4.1 g/dL (ref 3.6–5.1)
ALK PHOS: 80 U/L (ref 40–115)
ALT: 25 U/L (ref 9–46)
AST: 20 U/L (ref 10–40)
BUN: 11 mg/dL (ref 7–25)
CHLORIDE: 105 mmol/L (ref 98–110)
CO2: 23 mmol/L (ref 20–31)
CREATININE: 0.86 mg/dL (ref 0.60–1.35)
Calcium: 9.5 mg/dL (ref 8.6–10.3)
Glucose, Bld: 98 mg/dL (ref 65–99)
POTASSIUM: 4.5 mmol/L (ref 3.5–5.3)
Sodium: 143 mmol/L (ref 135–146)
Total Bilirubin: 0.4 mg/dL (ref 0.2–1.2)
Total Protein: 7.5 g/dL (ref 6.1–8.1)

## 2016-05-30 LAB — CBC
HEMATOCRIT: 46.4 % (ref 38.5–50.0)
HEMOGLOBIN: 15.2 g/dL (ref 13.2–17.1)
MCH: 27 pg (ref 27.0–33.0)
MCHC: 32.8 g/dL (ref 32.0–36.0)
MCV: 82.3 fL (ref 80.0–100.0)
MPV: 10.3 fL (ref 7.5–12.5)
Platelets: 304 10*3/uL (ref 140–400)
RBC: 5.64 MIL/uL (ref 4.20–5.80)
RDW: 15.8 % — AB (ref 11.0–15.0)
WBC: 6.3 10*3/uL (ref 3.8–10.8)

## 2016-05-30 LAB — TSH: TSH: 0.65 mIU/L (ref 0.40–4.50)

## 2016-05-30 MED ORDER — ESCITALOPRAM OXALATE 20 MG PO TABS: ORAL_TABLET | ORAL | 0 refills | Status: DC

## 2016-05-30 MED ORDER — SIMVASTATIN 10 MG PO TABS: 10.0000 mg | ORAL_TABLET | Freq: Every day | ORAL | 1 refills | Status: DC

## 2016-05-30 NOTE — Patient Instructions (Addendum)
Start Lexapro at 10mg  (1/2 tab) nightly for 1 week. Then increase to 20 mg (1 tab) nightly. Follow-up in 1 month. Do your best to get outside and get some sunshine and exercise to help boost your mood, even if it is just a 25 minute walk If you would like to see Dr. Darene Lamer (sports medicine) for Orthotics Appt.on the same day, let the front desk know  Fat and Cholesterol Restricted Diet High levels of fat and cholesterol in your blood may lead to various health problems, such as diseases of the heart, blood vessels, gallbladder, liver, and pancreas. Fats are concentrated sources of energy that come in various forms. Certain types of fat, including saturated fat, may be harmful in excess. Cholesterol is a substance needed by your body in small amounts. Your body makes all the cholesterol it needs. Excess cholesterol comes from the food you eat. When you have high levels of cholesterol and saturated fat in your blood, health problems can develop because the excess fat and cholesterol will gather along the walls of your blood vessels, causing them to narrow. Choosing the right foods will help you control your intake of fat and cholesterol. This will help keep the levels of these substances in your blood within normal limits and reduce your risk of disease. What is my plan? Your health care provider recommends that you:  Limit your fat intake to 5% or less of your total calories per day.  Limit the amount of calories from saturated and trans fats in your diet  Eat 20-30 grams of fiber each day. What types of fat should I choose?  Choose healthy fats more often. Choose monounsaturated and polyunsaturated fats, such as olive and canola oil, flaxseeds, walnuts, almonds, and seeds.  Eat more omega-3 fats. Good choices include salmon, mackerel, sardines, tuna, flaxseed oil, and ground flaxseeds. Aim to eat fish at least two times a week.  Limit saturated fats. Saturated fats are primarily found in animal  products, such as meats, butter, and cream. Plant sources of saturated fats include palm oil, palm kernel oil, and coconut oil.  Avoid foods with partially hydrogenated oils in them. These contain trans fats. Examples of foods that contain trans fats are stick margarine, some tub margarines, cookies, crackers, and other baked goods. What general guidelines do I need to follow? These guidelines for healthy eating will help you control your intake of fat and cholesterol:  Check food labels carefully to identify foods with trans fats or high amounts of saturated fat.  Fill one half of your plate with vegetables and green salads.  Fill one fourth of your plate with whole grains. Look for the word "whole" as the first word in the ingredient list.  Fill one fourth of your plate with lean protein foods.  Limit fruit to two servings a day. Choose fruit instead of juice.  Eat more foods that contain fiber, such as apples, broccoli, carrots, beans, peas, and barley.  Eat more home-cooked food and less restaurant, buffet, and fast food.  Limit or avoid alcohol.  Limit foods high in starch and sugar.  Limit fried foods.  Cook foods using methods other than frying. Baking, boiling, grilling, and broiling are all great options.  Lose weight if you are overweight. Losing just 5-10% of your initial body weight can help your overall health and prevent diseases such as diabetes and heart disease. What foods can I eat? Grains  Whole grains, such as whole wheat or whole grain breads, crackers,  cereals, and pasta. Unsweetened oatmeal, bulgur, barley, quinoa, or brown rice. Corn or whole wheat flour tortillas. Vegetables  Fresh or frozen vegetables (raw, steamed, roasted, or grilled). Green salads. Fruits  All fresh, canned (in natural juice), or frozen fruits. Meats and other protein foods  Ground beef (85% or leaner), grass-fed beef, or beef trimmed of fat. Skinless chicken or [place]. Ground  chicken or [place]. Pork trimmed of fat. All fish and seafood. Eggs. Dried beans, peas, or lentils. Unsalted nuts or seeds. Unsalted canned or dry beans. Dairy  Low-fat dairy products, such as skim or 1% milk, 2% or reduced-fat cheeses, low-fat ricotta or cottage cheese, or plain low-fat yo Fats and oils  Tub margarines without trans fats. Light or reduced-fat mayonnaise and salad dressings. Avocado. Olive, canola, sesame, or safflower oils. Natural peanut or almond butter (choose ones without added sugar and oil). The items listed above may not be a complete list of recommended foods or beverages. Contact your dietitian for more options.  Foods to avoid Grains  White bread. White pasta. White rice. Cornbread. Bagels, pastries, and croissants. Crackers that contain trans fat. Vegetables  White potatoes. Corn. Creamed or fried vegetables. Vegetables in a cheese sauce. Fruits  Dried fruits. Canned fruit in light or heavy syrup. Fruit juice. Meats and other protein foods  Fatty cuts of meat. Ribs, chicken wings, bacon, sausage, bologna, salami, chitterlings, fatback, hot dogs, bratwurst, and packaged luncheon meats. Liver and organ meats. Dairy  Whole or 2% milk, cream, half-and-half, and cream cheese. Whole milk cheeses. Whole-fat or sweetened yogurt. Full-fat cheeses. Nondairy creamers and whipped toppings. Processed cheese, cheese spreads, or cheese curds. Beverages  Alcohol. Sweetened drinks (such as sodas, lemonade, and fruit drinks or punches). Fats and oils  Butter, stick margarine, lard, shortening, ghee, or bacon fat. Coconut, palm kernel, or palm oils. Sweets and desserts  Corn syrup, sugars, honey, and molasses. Candy. Jam and jelly. Syrup. Sweetened cereals. Cookies, pies, cakes, donuts, muffins, and ice cream. The items listed above may not be a complete list of foods and beverages to avoid. Contact your dietitian for more information.  This information is not intended to  replace advice given to you by your health care provider. Make sure you discuss any questions you have with your health care provider. Document Released: 04/22/2005 Document Revised: 05/13/2014 Document Reviewed: 07/21/2013 Elsevier Interactive Patient Education  2017 [employer].  Physical Activity Recommendations for modifying lipids and lowering blood pressure Engage in aerobic physical activity to reduce LDL-cholesterol, non-HDL-cholesterol, and blood pressure  Frequency: 3-4 sessions per week  Intensity: moderate to vigorous  Duration: 40 minutes on average  Physical Activity Recommendations for secondary prevention 1. Aerobic exercise  Frequency: 3-5 sessions per week  Intensity: 50-80% capacity  Duration: 20 - 60 minutes  Examples: walking, treadmill, cycling, rowing, stair climbing, and arm/leg ergometry  2. Resistance exercise  Frequency: 2-3 sessions per week  Intensity: 10-15 repetitions/set to moderate fatigue  Duration: 1-3 sets of 8-10 upper and lower body exercises  Examples: calisthenics, elastic bands, cuff/hand weights, dumbbels, free weights, wall pulleys, and weight machines  Heart-Healthy Lifestyle  Eating a diet rich in vegetables, fruits and whole grains: also includes low-fat dairy products, poultry, fish, legumes, and nuts; limit intake of sweets, sugar-sweetened beverages and red meats  Getting regular exercise  Maintaining a healthy weight  Not smoking or getting help quitting  Staying on top of your health; for some people, lifestyle changes alone may not be enough to prevent a heart  attack or stroke. In these cases, taking a statin at the right dose will most likely be necessary

## 2016-05-30 NOTE — Progress Notes (Signed)
HPI:                                                                Christopher [name] is a 46 y.o. male who presents to [place]: [place] today to establish care  Patient is an Scientist, research (life sciences) and followed by the [place] for some of his care.   Dyslipidemia: taking Simvastatin daily for approx. 1 year. Also taking a fish oil pill. No family history of heart disease or high cholesterol. Lab Results  Component Value Date   CHOL 137 05/30/2016   HDL 42 05/30/2016   LDLCALC 70 05/30/2016   TRIG 123 05/30/2016   CHOLHDL 3.3 05/30/2016   Depression/Insomnia: patient endorses low energy and depressed mood. He has been more tearful than usual.  Insomnia is a chronic problem for him. He is currently undergoing treatment for PTSD at the [place] [facility]. He is in group therapy and recently started Prazosin for nightmares. He has never taken antidepressants. States he has been told that he has anger management problems, but he has felt more sadness than irritability lately. Denies SI/HI. No AH/VH.  Family hx significant for prostate cancer  Health Maintenance Health Maintenance  Topic Date Due  . HIV Screening  05/30/2017 (Originally 07/09/1985)  . TETANUS/TDAP  05/06/2022  . INFLUENZA VACCINE  Completed    Past Medical History:  Diagnosis Date  . Hyperlipidemia    No past surgical history on file. Social History  Substance Use Topics  . Smoking status: Never Smoker  . Smokeless tobacco: Never Used  . Alcohol use Yes   family history is not on file.  Review of Systems  Constitutional: Positive for malaise/fatigue. Negative for chills, fever and weight loss.  HENT: Negative.   Eyes: Negative.   Respiratory:       Sleep apnea  Cardiovascular: Negative for chest pain and palpitations.  Gastrointestinal: Negative.   Genitourinary: Negative.   Musculoskeletal: Positive for back pain.  Skin: Negative.   Neurological: Negative.   Psychiatric/Behavioral: Positive  for depression. Negative for hallucinations, substance abuse and suicidal ideas. The patient has insomnia.      Medications: Current Outpatient Prescriptions  Medication Sig Dispense Refill  . AMBULATORY NON FORMULARY MEDICATION CPAP therapy on 17 cm H2O with a Wide size Resmed Nasal Mask Mirage FX mask and heated humidification. Use nightly for sleep.  Dx: Obstructive Sleep Apnea 1 Units 0  . doxepin (SINEQUAN) 25 MG capsule Take 1 capsule (25 mg total) by mouth at bedtime. 30 capsule 2  . EPINEPHrine 0.3 mg/0.3 mL IJ SOAJ injection Inject into the muscle.    . meloxicam (MOBIC) 15 MG tablet One tab PO qAM with breakfast for 2 weeks, then daily prn pain. 30 tablet 3  . simvastatin (ZOCOR) 10 MG tablet Take 1 tablet (10 mg total) by mouth daily. 90 tablet 1  . Vilazodone HCl (VIIBRYD) 40 MG TABS Take 1 tablet (40 mg total) by mouth daily. 30 tablet 3  . escitalopram (LEXAPRO) 20 MG tablet One half tab in the evening for a week then one tab by mouth nightly 30 tablet 0  . prazosin (MINIPRESS) 1 MG capsule   0   No current facility-administered medications for this visit.    No Known  Allergies     Objective:  BP 119/74   Pulse 65   Wt (!) 320 lb (145.2 kg)   BMI 48.66 kg/m  Gen: well-groomed, morbidly obese, cooperative, not ill-appearing, no distress HEENT: normal conjunctiva, TM's clear, oropharynx clear, moist mucus membranes, no thyromegaly or tenderness Lungs: Normal work of breathing, clear to auscultation bilaterally Heart: Normal rate, regular rhythm, s1 and s2 distinct, no murmurs, clicks or rubs appreciated on this exam, no carotid bruit Abd: Soft. Nondistended, Nontender Neuro: alert and oriented x 3, EOM's intact, PERRLA MSK: strength 5/5 and symmetric, normal gait Extremities: distal pulses intact, no peripheral edema Skin: warm and dry, no rashes or lesions on exposed skin Psych: normal affect, depressed mood, normal speech and thought content  Depression screen  [facility] 2/9 05/30/2016  Decreased Interest 2  Down, Depressed, Hopeless 2  PHQ - 2 Score 4  Altered sleeping 2  Tired, decreased energy 2  Change in appetite 3  Feeling bad or failure about yourself  2  Trouble concentrating 0  Moving slowly or fidgety/restless 0  Suicidal thoughts 0  PHQ-9 Score 13     Assessment and Plan: 46 y.o. male with   1. Moderate episode of recurrent major depressive disorder (HCC) - PHQ9 score of 13 today. Initiating Lexapro.  Lab Results  Component Value Date   TSH 0.65 05/30/2016  - follow-up in 1 month - escitalopram (LEXAPRO) 20 MG tablet; One half tab in the evening for a week then one tab by mouth nightly  Dispense: 30 tablet; Refill: 0 - Testosterone Total,Free,Bio, Males  2. Mixed hyperlipidemia - current ASCVD risk 2.4% - advised that it would be more beneficial to make dietary changes than take fish oil supplements - given information on fat and cholesterol restricted diet - encouraged on physical activity for lowering lipids - simvastatin (ZOCOR) 10 MG tablet; Take 1 tablet (10 mg total) by mouth daily.  Dispense: 90 tablet; Refill: 1  3. Encounter for preventative adult health care examination - PSA, total and free - TSH - Lipid panel - Hemoglobin A1c - Comprehensive metabolic panel - CBC - Testosterone Total,Free,Bio, Males  4. Chronic post-traumatic stress disorder (PTSD) - cont Prazosin nightly and group therapy  Patient education and anticipatory guidance given Patient agrees with treatment plan Follow-up in 1 month or sooner as needed  Darlyne Russian PA-C

## 2016-05-31 LAB — TESTOSTERONE TOTAL,FREE,BIO, MALES
ALBUMIN: 4.1 g/dL (ref 3.6–5.1)
SEX HORMONE BINDING: 28 nmol/L (ref 10–50)
TESTOSTERONE: 171 ng/dL — AB (ref 250–827)

## 2016-05-31 LAB — PSA, TOTAL AND FREE
PSA, % FREE: 20 % — AB (ref >25)
PSA, Free: 0.3 ng/mL
PSA, Total: 1.5 ng/mL (ref <=4.0)

## 2016-05-31 LAB — HEMOGLOBIN A1C
HEMOGLOBIN A1C: 6.1 % — AB (ref <5.7)
Mean Plasma Glucose: 128 mg/dL

## 2016-06-01 ENCOUNTER — Encounter: Payer: Self-pay | Admitting: Physician Assistant

## 2016-06-01 DIAGNOSIS — E291 Testicular hypofunction: Secondary | ICD-10-CM | POA: Insufficient documentation

## 2016-06-01 DIAGNOSIS — R7303 Prediabetes: Secondary | ICD-10-CM | POA: Insufficient documentation

## 2016-06-01 DIAGNOSIS — R972 Elevated prostate specific antigen [PSA]: Secondary | ICD-10-CM | POA: Insufficient documentation

## 2016-06-03 ENCOUNTER — Other Ambulatory Visit: Payer: Self-pay | Admitting: Physician Assistant

## 2016-06-03 DIAGNOSIS — R7303 Prediabetes: Secondary | ICD-10-CM

## 2016-06-18 DIAGNOSIS — F4310 Post-traumatic stress disorder, unspecified: Secondary | ICD-10-CM | POA: Diagnosis not present

## 2016-06-18 DIAGNOSIS — F329 Major depressive disorder, single episode, unspecified: Secondary | ICD-10-CM | POA: Diagnosis not present

## 2016-06-18 DIAGNOSIS — F419 Anxiety disorder, unspecified: Secondary | ICD-10-CM | POA: Diagnosis not present

## 2016-06-21 DIAGNOSIS — E669 Obesity, unspecified: Secondary | ICD-10-CM | POA: Diagnosis not present

## 2016-06-21 DIAGNOSIS — R7303 Prediabetes: Secondary | ICD-10-CM | POA: Diagnosis not present

## 2016-06-27 ENCOUNTER — Institutional Professional Consult (permissible substitution): Payer: Self-pay | Admitting: Sports Medicine

## 2016-06-27 ENCOUNTER — Ambulatory Visit: Payer: Self-pay | Admitting: Physician Assistant

## 2016-06-28 ENCOUNTER — Ambulatory Visit (INDEPENDENT_AMBULATORY_CARE_PROVIDER_SITE_OTHER): Payer: BLUE CROSS/BLUE SHIELD | Admitting: Physician Assistant

## 2016-06-28 VITALS — BP 131/80 | HR 93 | Wt 322.0 lb

## 2016-06-28 DIAGNOSIS — E291 Testicular hypofunction: Secondary | ICD-10-CM | POA: Diagnosis not present

## 2016-06-28 DIAGNOSIS — F330 Major depressive disorder, recurrent, mild: Secondary | ICD-10-CM

## 2016-06-28 NOTE — Progress Notes (Signed)
HPI:                                                                Christopher [name] is a 46 y.o. male who presents to [place]: [place] today for depression follow-up  Patient reports he is doing well. He is taking Lexapro daily without adverse effects. He denies anhedonia, sleep disturbance, difficulty concentrating, guilt, or suicidal thinking. He reports he is exercising almost daily in his sub-division, local park and at the middle school track and that this helps to take his mind off of things. He continues to follow-up at the [facility] for PTSD and is taking Prazosin nightly. He states this has helped his nightmares.  Patient was found to have low testosterone on his labs last month. He does endorse decreased energy, decreased strength/endurance, and falling asleep after dinner. He has OSA and is compliant with his CPAP nightly. Hgb/Hct and PSA are within normal limits.  Past Medical History:  Diagnosis Date  . Hyperlipidemia   . Hyperlipidemia   . OSA (obstructive sleep apnea) 12/02/2013   CPAP therapy on 17 cm H2O with a Wide size Resmed Nasal Mask Mirage FX mask and heated humidification    No past surgical history on file. Social History  Substance Use Topics  . Smoking status: Never Smoker  . Smokeless tobacco: Never Used  . Alcohol use Yes   family history is not on file.  ROS: negative except as noted in the HPI  Medications: Current Outpatient Prescriptions  Medication Sig Dispense Refill  . AMBULATORY NON FORMULARY MEDICATION CPAP therapy on 17 cm H2O with a Wide size Resmed Nasal Mask Mirage FX mask and heated humidification. Use nightly for sleep.  Dx: Obstructive Sleep Apnea 1 Units 0  . doxepin (SINEQUAN) 25 MG capsule Take 1 capsule (25 mg total) by mouth at bedtime. 30 capsule 2  . EPINEPHrine 0.3 mg/0.3 mL IJ SOAJ injection Inject into the muscle.    . escitalopram (LEXAPRO) 20 MG tablet One half tab in the evening for a week  then one tab by mouth nightly 30 tablet 0  . meloxicam (MOBIC) 15 MG tablet One tab PO qAM with breakfast for 2 weeks, then daily prn pain. 30 tablet 3  . prazosin (MINIPRESS) 1 MG capsule   0  . simvastatin (ZOCOR) 10 MG tablet Take 1 tablet (10 mg total) by mouth daily. 90 tablet 1  . Vilazodone HCl (VIIBRYD) 40 MG TABS Take 1 tablet (40 mg total) by mouth daily. 30 tablet 3   No current facility-administered medications for this visit.    No Known Allergies     Objective:  BP 131/80   Pulse 93   Wt (!) 322 lb (146.1 kg)   BMI 48.96 kg/m  Gen: well-groomed, obese, cooperative, not ill-appearing, no distress Pulm: Normal work of breathing, normal phonation Neuro: alert and oriented x 3, EOM's intact Psych: appropriate affect, pleasant mood, normal speech and vocabulary, normal thought content  Depression screen [facility] 2/9 06/28/2016 05/30/2016  Decreased Interest 0 2  Down, Depressed, Hopeless 1 2  PHQ - 2 Score 1 4  Altered sleeping 0 2  Tired, decreased energy 2 2  Change in appetite 2 3  Feeling bad or failure  about yourself  0 2  Trouble concentrating 0 0  Moving slowly or fidgety/restless 0 0  Suicidal thoughts 0 0  PHQ-9 Score 5 13    Assessment and Plan: 46 y.o. male with   1. Hypogonadism in male - rechecking early am testosterone today to confirm diagnosis and for insurance approval for therapy - provided patient with information on androgen gel, patch and injection. He is going to decide which form of administration he prefers and contact us - educated patient that testosterone therapy may worsen his OSA and require adjustment of his pressures, so he should monitor himself for symptoms Lab Results  Component Value Date   TESTOSTERONE 171 (L) 05/30/2016   Lab Results  Component Value Date   HGB 15.2 05/30/2016   Lab Results  Component Value Date   HCT 46.4 05/30/2016   Lab Results  Component Value Date   PSA 1.49 01/30/2015    2. Mild episode of  recurrent major depressive disorder (HCC) - PHQ9 significantly improved from 13 to 5 today - cont Lexapro 20mg  daily - cont Vilazodone 40mg  nightly and Prazosin 1mg  [facility] - follow-up in 3 months  Patient education and anticipatory guidance given Patient agrees with treatment plan Follow-up in 1 week for nurse visit for Testosterone or sooner as needed  Darlyne Russian PA-C

## 2016-06-28 NOTE — Patient Instructions (Signed)
Testosterone skin gel What is this medicine? TESTOSTERONE (tes TOS ter one) is the main male hormone. It supports normal male traits such as muscle growth, facial hair, and deep voice. This gel is used in males to treat low testosterone levels. This medicine may be used for other purposes; ask your health care provider or pharmacist if you have questions. COMMON BRAND NAME(S): AndroGel, FORTESTA, Testim, Vogelxo What should I tell my health care provider before I take this medicine? They need to know if you have any of these conditions: -breast cancer -diabetes -heart disease -if a male partner is pregnant or trying to get pregnant -kidney disease -liver disease -lung disease -prostate cancer, enlargement -an unusual or allergic reaction to testosterone, soy proteins, other medicines, foods, dyes, or preservatives -pregnant or trying to get pregnant -breast-feeding How should I use this medicine? This medicine is for external use only. This medicine is applied at the same time every day (preferably in the morning) to clean, dry, intact skin. If you take a bath or shower in the morning, apply the gel after the bath or shower. Follow the directions on the prescription label. Make sure that you are using your testosterone gel product correctly and applying it only to the appropriate skin area (see below). Allow the skin to dry a few minutes then cover with clothing to prevent others from coming in contact with the medicine on your skin. The gel is flammable. Avoid fire, flame, or smoking until the gel has dried. Wash your hands with soap and water after use. For AndroGel 1% Packets: Open the packet(s) needed for your dose. You can put the entire dose into your palm all at once or just a little at a time to apply. If you prefer, you can instead squeeze the gel directly onto the area you are applying it to. Apply on the shoulders, upper arm, or abdomen as directed. Do not apply to the scrotum or  genitals. Be sure you use the correct total dose. It is best to wait 5 to 6 hours after application of the gel before showering or swimming. For AndroGel 1%: Pump the dose into the palm of your hand. You can put the entire dose into your palm all at once or just a little at a time to apply. If you prefer, you can instead pump the gel directly onto the area you are applying it to. Apply on the shoulders, upper arm, or abdomen as directed. Do not apply to the scrotum or genitals. Be sure you use the correct total dose. It is best to wait for 5 to 6 hours after application of the gel before showering or swimming. For Androgel 1.62% packets: Open the packet(s) needed for your dose. You can put the entire dose into your palm all at once or just a little at a time to apply. If you prefer, you can instead squeeze the gel directly onto the area you are applying it to. Apply on the shoulders and upper arms as directed. Do not apply to other parts of the body including the abdomen, genitals, chest, armpits, or knees. Be sure you use the correct total dose. It is best to wait 2 hours after application of the gel before washing, showering, or swimming. For AndroGel 1.62%: Pump the dose into the palm of your hand. Dispense one pump of gel at a time into the palm of your hand before applying it. If you prefer, you can instead pump the gel directly onto the area  you are applying it to. Apply on the shoulders and upper arms as directed. Do not apply to other parts of the body including the abdomen, genitals, chest, armpits, or knees. Be sure you use the correct total dose. It is best to wait 2 hours after application of the gel before washing, showering, or swimming. For Testim: Open the tube(s) needed for your dose. Squeeze the gel from the tube into the palm of your hand. Apply on the shoulders or upper arms as directed. Do not apply to the scrotum, genitals, or abdomen. Be sure you use the correct total dose. Do not shower  or swim for at least 2 hours after application of the gel. For Fortesta: Use the multi-dose pump to pump the gel directly onto the area you are applying it to. Apply on the thighs as directed. Do not apply to the abdomen, penis, scrotum, shoulders or upper arms. Gently rub the gel onto the skin using your finger. Be sure you use the correct total dose. Do not shower or swim for at least 2 hours after application of the gel. A special MedGuide will be given to you by the pharmacist with each prescription and refill. Be sure to read this information carefully each time. Talk to your pediatrician regarding the use of this medicine in children. Special care may be needed. Overdosage: If you think you have taken too much of this medicine contact a poison control center or emergency room at once. NOTE: This medicine is only for you. Do not share this medicine with others. What if I miss a dose? If you miss a dose, use it as soon as you can. If it is almost time for your next dose, use only that dose. Do not use double or extra doses. What may interact with this medicine? -medicines for diabetes -medicines that treat or prevent blood clots like warfarin -oxyphenbutazone -propranolol -steroid medicines like prednisone or cortisone This list may not describe all possible interactions. Give your health care provider a list of all the medicines, herbs, non-prescription drugs, or dietary supplements you use. Also tell them if you smoke, drink alcohol, or use illegal drugs. Some items may interact with your medicine. What should I watch for while using this medicine? Visit your doctor or health care professional for regular checks on your progress. They will need to check the level of testosterone in your blood. This medicine is only approved for use in men who have low levels of testosterone related to certain medical conditions. Heart attacks and strokes have been reported with the use of this medicine. Notify  your doctor or health care professional and seek emergency treatment if you develop breathing problems; changes in vision; confusion; chest pain or chest tightness; sudden arm pain; severe, sudden headache; trouble speaking or understanding; sudden numbness or weakness of the face, arm or leg; loss of balance or coordination. Talk to your doctor about the risks and benefits of this medicine. This medicine can transfer from your body to others. If a person or pet comes in contact with the area where this medicine was applied to your skin, they may have a serious risk of side effects. If you cannot avoid skin-to-skin contact with another person, make sure the site where this medicine was applied is covered with clothing. If accidental contact happens, the skin of the person or pet should be washed right away with soap and water. Also, a male partner who is pregnant or trying to get pregnant should avoid   contact with the gel or treated skin. This medicine may affect blood sugar levels. If you have diabetes, check with your doctor or health care professional before you change your diet or the dose of your diabetic medicine. This drug is banned from use in athletes by most athletic organizations. What side effects may I notice from receiving this medicine? Side effects that you should report to your doctor or health care professional as soon as possible: -allergic reactions like skin rash, itching or hives, swelling of the face, lips, or tongue -breast enlargement -breathing problems -changes in mood, especially anger, depression, or rage -dark urine -general ill feeling or flu-like symptoms -light-colored stools -loss of appetite, nausea -nausea, vomiting -right upper belly pain -stomach pain -swelling of ankles -too frequent or persistent erections -trouble passing urine or change in the amount of urine -unusually weak or tired -yellowing of the eyes or skin Side effects that usually do not  require medical attention (report to your doctor or health care professional if they continue or are bothersome): -acne -change in sex drive or performance -hair loss -headache This list may not describe all possible side effects. Call your doctor for medical advice about side effects. You may report side effects to FDA at 1-800-FDA-1088. Where should I keep my medicine? Keep out of the reach of children. This medicine can be abused. Keep your medicine in a safe place to protect it from theft. Do not share this medicine with anyone. Selling or giving away this medicine is dangerous and against the law. Store at room temperature between 15 to 30 degrees C (59 to 86 degrees F). Keep closed until use. Protect from heat and light. This medicine is flammable. Avoid exposure to heat, fire, flame, and smoking. Throw away any unused medicine after the expiration date. NOTE: This sheet is a summary. It may not cover all possible information. If you have questions about this medicine, talk to your doctor, pharmacist, or health care provider.  2017 [employer] Standard (2013-07-08 08:27:26)  Testosterone injection What is this medicine? TESTOSTERONE (tes TOS ter one) is the main male hormone. It supports normal male development such as muscle growth, facial hair, and deep voice. It is used in males to treat low testosterone levels. This medicine may be used for other purposes; ask your health care provider or pharmacist if you have questions. COMMON BRAND NAME(S): Andro-L.A., Aveed, Delatestryl, Depo-Testosterone, Virilon What should I tell my health care provider before I take this medicine? They need to know if you have any of these conditions: -cancer -diabetes -heart disease -kidney disease -liver disease -lung disease -prostate disease -an unusual or allergic reaction to testosterone, other medicines, foods, dyes, or preservatives -pregnant or trying to get pregnant -breast-feeding How  should I use this medicine? This medicine is for injection into a muscle. It is usually given by a health care professional in a hospital or clinic setting. Contact your pediatrician regarding the use of this medicine in children. While this medicine may be prescribed for children as young as 61 years of age for selected conditions, precautions do apply. Overdosage: If you think you have taken too much of this medicine contact a poison control center or emergency room at once. NOTE: This medicine is only for you. Do not share this medicine with others. What if I miss a dose? Try not to miss a dose. Your doctor or health care professional will tell you when your next injection is due. Notify the office if you are unable  to keep an appointment. What may interact with this medicine? -medicines for diabetes -medicines that treat or prevent blood clots like warfarin -oxyphenbutazone -propranolol -steroid medicines like prednisone or cortisone This list may not describe all possible interactions. Give your health care provider a list of all the medicines, herbs, non-prescription drugs, or dietary supplements you use. Also tell them if you smoke, drink alcohol, or use illegal drugs. Some items may interact with your medicine. What should I watch for while using this medicine? Visit your doctor or health care professional for regular checks on your progress. They will need to check the level of testosterone in your blood. This medicine is only approved for use in men who have low levels of testosterone related to certain medical conditions. Heart attacks and strokes have been reported with the use of this medicine. Notify your doctor or health care professional and seek emergency treatment if you develop breathing problems; changes in vision; confusion; chest pain or chest tightness; sudden arm pain; severe, sudden headache; trouble speaking or understanding; sudden numbness or weakness of the face, arm or  leg; loss of balance or coordination. Talk to your doctor about the risks and benefits of this medicine. This medicine may affect blood sugar levels. If you have diabetes, check with your doctor or health care professional before you change your diet or the dose of your diabetic medicine. Testosterone injections are not commonly used in women. Women should inform their doctor if they wish to become pregnant or think they might be pregnant. There is a potential for serious side effects to an unborn child. Talk to your health care professional or pharmacist for more information. Talk with your doctor or health care professional about your birth control options while taking this medicine. This drug is banned from use in athletes by most athletic organizations. What side effects may I notice from receiving this medicine? Side effects that you should report to your doctor or health care professional as soon as possible: -allergic reactions like skin rash, itching or hives, swelling of the face, lips, or tongue -breast enlargement -breathing problems -changes in emotions or moods -deep or hoarse voice -irregular menstrual periods -signs and symptoms of liver injury like dark yellow or brown urine; general ill feeling or flu-like symptoms; light-colored stools; loss of appetite; nausea; right upper belly pain; unusually weak or tired; yellowing of the eyes or skin -stomach pain -swelling of the ankles, feet, hands -too frequent or persistent erections -trouble passing urine or change in the amount of urine Side effects that usually do not require medical attention (report to your doctor or health care professional if they continue or are bothersome): -acne -change in sex drive or performance -facial hair growth -hair loss -headache This list may not describe all possible side effects. Call your doctor for medical advice about side effects. You may report side effects to FDA at 1-800-FDA-1088. Where  should I keep my medicine? Keep out of the reach of children. This medicine can be abused. Keep your medicine in a safe place to protect it from theft. Do not share this medicine with anyone. Selling or giving away this medicine is dangerous and against the law. Store at room temperature between 20 and 25 degrees C (68 and 77 degrees F). Do not freeze. Protect from light. Follow the directions for the product you are prescribed. Throw away any unused medicine after the expiration date. NOTE: This sheet is a summary. It may not cover all possible information. If you  have questions about this medicine, talk to your doctor, pharmacist, or health care provider.  2017 [employer] Standard (2015-05-27 07:33:55)  Testosterone skin patches What is this medicine? TESTOSTERONE (tes TOS ter one) is the main male hormone. It supports normal male traits such as muscle growth, facial hair, and deep voice. This skin patch is used in males to treat low testosterone levels. This medicine may be used for other purposes; ask your health care provider or pharmacist if you have questions. COMMON BRAND NAME(S): Androderm, Testoderm What should I tell my health care provider before I take this medicine? They need to know if you have any of these conditions: -breast cancer -diabetes -heart disease -kidney disease -liver disease -lung disease -prostate cancer, enlargement -an unusual or allergic reaction to testosterone, adhesives, other medicines, foods, dyes, or preservatives -pregnant or trying to get pregnant -breast-feeding How should I use this medicine? Apply these patches once daily, at the same time every evening. Follow the directions on the prescription label. Open the pouch and remove the patch. Remove the protective liner and silver disk from the patch. If the liner is difficult to pull off or if you see glue sticking to the liner, do NOT use the patch, throw it away and get a new one. Apply to a  clean, dry area of intact skin on the back, abdomen, upper arms, or thighs. Do not apply to a bony area like the hip or shoulder or an area that might receive a lot of pressure while sitting or sleeping. Do not apply to the genitals or scrotum. Remove and replace the patch as directed every 24 hours, applying a new patch to a new site. When you remove a patch, do not place another patch on the same spot for at least 7 days. The patch may be worn during sex, showering, or swimming. Excessive sweating or strenuous exercise might cause the patch to loosen or fall off. Talk to your pediatrician regarding the use of this medicine in children. While this skin patch has been used in males as young as 47 years of age, precautions may apply. Overdosage: If you think you have taken too much of this medicine contact a poison control center or emergency room at once. NOTE: This medicine is only for you. Do not share this medicine with others. What if I miss a dose? Try not to miss a dose. If the patch becomes loose, simply smooth it down again around the edges. If a patch is forgotten or falls off before noon, apply a fresh patch and wear it until you get back on your normal schedule that evening. If a patch is forgotten or falls off in the afternoon or later in the day, do not use a new patch until it is time for your next evening dose. Do not use double or extra doses. What may interact with this medicine? -medicines for diabetes -medicines that treat or prevent blood clots like warfarin -oxyphenbutazone -propranolol -steroid medicines like prednisone or cortisone This list may not describe all possible interactions. Give your health care provider a list of all the medicines, herbs, non-prescription drugs, or dietary supplements you use. Also tell them if you smoke, drink alcohol, or use illegal drugs. Some items may interact with your medicine. What should I watch for while using this medicine? Visit your  doctor or health care professional for regular checks on your progress. They will need to check the level of testosterone in your blood. This medicine is only approved  for use in men who have low levels of testosterone related to certain medical conditions. Heart attacks and strokes have been reported with the use of this medicine. Notify your doctor or health care professional and seek emergency treatment if you develop breathing problems; changes in vision; confusion; chest pain or chest tightness; sudden arm pain; severe, sudden headache; trouble speaking or understanding; sudden numbness or weakness of the face, arm or leg; loss of balance or coordination. Talk to your doctor about the risks and benefits of this medicine. This medicine may affect blood sugar levels. If you have diabetes, check with your doctor or health care professional before you change your diet or the dose of your diabetic medicine. This drug is banned from use in athletes by most athletic organizations. If you are going to have a magnetic resonance imaging (MRI) procedure, tell your MRI technician if you have this patch on your body. It must be removed before a MRI. What side effects may I notice from receiving this medicine? Side effects that you should report to your doctor or health care professional as soon as possible: -allergic reactions like skin rash, itching or hives, swelling of the face, lips, or tongue -breast enlargement -breathing problems -changes in mood, especially anger, depression, or rage -dark urine -general ill feeling or flu-like symptoms -light-colored stools -loss of appetite, nausea -nausea, vomiting -right upper belly pain -stomach pain -swelling of ankles -too frequent or persistent erections -trouble passing urine or change in the amount of urine -unusually weak or tired -yellowing of the eyes or skin Side effects that usually do not require medical attention (report to your doctor or  health care professional if they continue or are bothersome): -acne -change in sex drive or performance -hair loss -headache -mild redness of the skin under the patch This list may not describe all possible side effects. Call your doctor for medical advice about side effects. You may report side effects to FDA at 1-800-FDA-1088. Where should I keep my medicine? Keep out of the reach of children. This medicine can be abused. Keep your medicine in a safe place to protect it from theft. Do not share this medicine with anyone. Selling or giving away this medicine is dangerous and against the law. Store at room temperature between 15 and 30 degrees C (59 and 86 degrees F). Keep each patch in its sealed pouch until ready to use. Protect from heat. Do not use a patch that appears to be damaged. Throw away any unused medicine after the expiration date. NOTE: This sheet is a summary. It may not cover all possible information. If you have questions about this medicine, talk to your doctor, pharmacist, or health care provider.  2017 [employer] Standard (2015-06-09 10:47:03)

## 2016-07-01 LAB — TESTOSTERONE TOTAL,FREE,BIO, MALES
ALBUMIN: 3.2 g/dL — AB (ref 3.6–5.1)
SEX HORMONE BINDING: 18 nmol/L (ref 10–50)
TESTOSTERONE: 192 ng/dL — AB (ref 250–827)

## 2016-07-11 ENCOUNTER — Ambulatory Visit (INDEPENDENT_AMBULATORY_CARE_PROVIDER_SITE_OTHER): Payer: BLUE CROSS/BLUE SHIELD | Admitting: Physician Assistant

## 2016-07-11 VITALS — BP 149/87 | HR 76 | Wt 324.0 lb

## 2016-07-11 DIAGNOSIS — E291 Testicular hypofunction: Secondary | ICD-10-CM | POA: Diagnosis not present

## 2016-07-11 DIAGNOSIS — F3341 Major depressive disorder, recurrent, in partial remission: Secondary | ICD-10-CM | POA: Diagnosis not present

## 2016-07-11 MED ORDER — SYRINGE 2-3 ML 3 ML MISC: 1 refills | Status: DC

## 2016-07-11 MED ORDER — BUPROPION HCL ER (XL) 150 MG PO TB24: ORAL_TABLET | ORAL | 0 refills | Status: DC

## 2016-07-11 MED ORDER — "NEEDLE (DISP) 22G X 1-1/2"" MISC": 1 refills | Status: DC

## 2016-07-11 MED ORDER — TESTOSTERONE CYPIONATE 200 MG/ML IM SOLN
200.0000 mg | Freq: Once | INTRAMUSCULAR | Status: AC
  Administered 2016-07-11: 200 mg via INTRAMUSCULAR

## 2016-07-11 NOTE — Patient Instructions (Addendum)
Pick-up your testosterone and injection supplies from your pharmacy Return in 2 weeks for a nurse visit We will re-check your blood work in 5 weeks  Stop Lexapro Start Wellbutrin 150mg  in the morning Follow-up in 1 month   Testosterone injection What is this medicine? TESTOSTERONE (tes TOS ter one) is the main male hormone. It supports normal male development such as muscle growth, facial hair, and deep voice. It is used in males to treat low testosterone levels. This medicine may be used for other purposes; ask your health care provider or pharmacist if you have questions. COMMON BRAND NAME(S): Andro-L.A., Aveed, Delatestryl, Depo-Testosterone, Virilon What should I tell my health care provider before I take this medicine? They need to know if you have any of these conditions: -cancer -diabetes -heart disease -kidney disease -liver disease -lung disease -prostate disease -an unusual or allergic reaction to testosterone, other medicines, foods, dyes, or preservatives -pregnant or trying to get pregnant -breast-feeding How should I use this medicine? This medicine is for injection into a muscle. It is usually given by a health care professional in a hospital or clinic setting. Contact your pediatrician regarding the use of this medicine in children. While this medicine may be prescribed for children as young as 25 years of age for selected conditions, precautions do apply. Overdosage: If you think you have taken too much of this medicine contact a poison control center or emergency room at once. NOTE: This medicine is only for you. Do not share this medicine with others. What if I miss a dose? Try not to miss a dose. Your doctor or health care professional will tell you when your next injection is due. Notify the office if you are unable to keep an appointment. What may interact with this medicine? -medicines for diabetes -medicines that treat or prevent blood clots like  warfarin -oxyphenbutazone -propranolol -steroid medicines like prednisone or cortisone This list may not describe all possible interactions. Give your health care provider a list of all the medicines, herbs, non-prescription drugs, or dietary supplements you use. Also tell them if you smoke, drink alcohol, or use illegal drugs. Some items may interact with your medicine. What should I watch for while using this medicine? Visit your doctor or health care professional for regular checks on your progress. They will need to check the level of testosterone in your blood. This medicine is only approved for use in men who have low levels of testosterone related to certain medical conditions. Heart attacks and strokes have been reported with the use of this medicine. Notify your doctor or health care professional and seek emergency treatment if you develop breathing problems; changes in vision; confusion; chest pain or chest tightness; sudden arm pain; severe, sudden headache; trouble speaking or understanding; sudden numbness or weakness of the face, arm or leg; loss of balance or coordination. Talk to your doctor about the risks and benefits of this medicine. This medicine may affect blood sugar levels. If you have diabetes, check with your doctor or health care professional before you change your diet or the dose of your diabetic medicine. Testosterone injections are not commonly used in women. Women should inform their doctor if they wish to become pregnant or think they might be pregnant. There is a potential for serious side effects to an unborn child. Talk to your health care professional or pharmacist for more information. Talk with your doctor or health care professional about your birth control options while taking this medicine. This drug is  banned from use in athletes by most athletic organizations. What side effects may I notice from receiving this medicine? Side effects that you should report to  your doctor or health care professional as soon as possible: -allergic reactions like skin rash, itching or hives, swelling of the face, lips, or tongue -breast enlargement -breathing problems -changes in emotions or moods -deep or hoarse voice -irregular menstrual periods -signs and symptoms of liver injury like dark yellow or brown urine; general ill feeling or flu-like symptoms; light-colored stools; loss of appetite; nausea; right upper belly pain; unusually weak or tired; yellowing of the eyes or skin -stomach pain -swelling of the ankles, feet, hands -too frequent or persistent erections -trouble passing urine or change in the amount of urine Side effects that usually do not require medical attention (report to your doctor or health care professional if they continue or are bothersome): -acne -change in sex drive or performance -facial hair growth -hair loss -headache This list may not describe all possible side effects. Call your doctor for medical advice about side effects. You may report side effects to FDA at 1-800-FDA-1088. Where should I keep my medicine? Keep out of the reach of children. This medicine can be abused. Keep your medicine in a safe place to protect it from theft. Do not share this medicine with anyone. Selling or giving away this medicine is dangerous and against the law. Store at room temperature between 20 and 25 degrees C (68 and 77 degrees F). Do not freeze. Protect from light. Follow the directions for the product you are prescribed. Throw away any unused medicine after the expiration date. NOTE: This sheet is a summary. It may not cover all possible information. If you have questions about this medicine, talk to your doctor, pharmacist, or health care provider.  2018 Elsevier/Gold Standard (2015-05-27 07:33:55)

## 2016-07-11 NOTE — Progress Notes (Signed)
HPI:                                                                Christopher [name] is a 46 y.o. male who presents to [place]: [place] today for hypogonadism follow-up  Patient is interested in starting depotestosterone injections.   Patient was found to have low am testosterone x 2 on his labs last month. He does endorse decreased energy, decreased strength/endurance, and falling asleep after dinner. He has OSA and is compliant with his CPAP nightly. Hgb/Hct and PSA are within normal limits.  Past Medical History:  Diagnosis Date  . Hyperlipidemia   . Hyperlipidemia   . OSA (obstructive sleep apnea) 12/02/2013   CPAP therapy on 17 cm H2O with a Wide size Resmed Nasal Mask Mirage FX mask and heated humidification    No past surgical history on file. Social History  Substance Use Topics  . Smoking status: Never Smoker  . Smokeless tobacco: Never Used  . Alcohol use Yes   family history is not on file.  ROS: negative except as noted in the HPI  Medications: Current Outpatient Prescriptions  Medication Sig Dispense Refill  . AMBULATORY NON FORMULARY MEDICATION CPAP therapy on 17 cm H2O with a Wide size Resmed Nasal Mask Mirage FX mask and heated humidification. Use nightly for sleep.  Dx: Obstructive Sleep Apnea 1 Units 0  . buPROPion (WELLBUTRIN XL) 150 MG 24 hr tablet 1 tablet daily for a month then 1 tab twice a day 30 tablet 0  . doxepin (SINEQUAN) 25 MG capsule Take 1 capsule (25 mg total) by mouth at bedtime. 30 capsule 2  . EPINEPHrine 0.3 mg/0.3 mL IJ SOAJ injection Inject into the muscle.    . meloxicam (MOBIC) 15 MG tablet One tab PO qAM with breakfast for 2 weeks, then daily prn pain. 30 tablet 3  . NEEDLE, DISP, 22 G 22G X 1-1/2" MISC Dispense intramuscular needles for testosterone injection 50 each 1  . prazosin (MINIPRESS) 1 MG capsule   0  . simvastatin (ZOCOR) 10 MG tablet Take 1 tablet (10 mg total) by mouth daily. 90  tablet 1  . Syringe, Disposable, (2-3CC SYRINGE) 3 ML MISC Dispense package of disposable syringes for testosterone injection 100 each 1  . testosterone cypionate (DEPOTESTOSTERONE CYPIONATE) 200 MG/ML injection Inject 1 mL (200 mg total) into the muscle every 14 (fourteen) days. 10 mL 0  . Vilazodone HCl (VIIBRYD) 40 MG TABS Take 1 tablet (40 mg total) by mouth daily. 30 tablet 3   No current facility-administered medications for this visit.    No Known Allergies     Objective:  BP (!) 149/87   Pulse 76   Wt (!) 324 lb (147 kg)   BMI 49.26 kg/m  Gen: well-groomed, morbidly obese, cooperative, not ill-appearing, no distress Pulm: Normal work of breathing, normal phonation Neuro: alert and oriented x 3, EOM's intact Skin: warm and dry, no rashes or lesions on exposed skin, no cyanosis Psych: good eye contact, appropriate affect, euthymic mood, normal speech and thought content  Depression screen [place] 2/9 07/11/2016 06/28/2016 05/30/2016  Decreased Interest 1 0 2  Down, Depressed, Hopeless 0 1 2  PHQ - 2 Score 1 1 4   Altered  sleeping 0 0 2  Tired, decreased energy 1 2 2   Change in appetite 1 2 3   Feeling bad or failure about yourself  0 0 2  Trouble concentrating 0 0 0  Moving slowly or fidgety/restless 0 0 0  Suicidal thoughts 0 0 0  PHQ-9 Score 3 5 13      Assessment and Plan: 46 y.o. male with   1. Hypogonadism in male - first injection given today - return in 2 weeks for nurse visit - checking serum Testosterone Total,Free,Bio, in 5 weeks - educated that testosterone therapy may worsen sleep apnea and require CPAP adjustments - testosterone cypionate (DEPOTESTOSTERONE CYPIONATE) injection 200 mg; Inject 1 mL (200 mg total) into the muscle once.  2. Recurrent major depressive disorder in partial remission - PHQ2 negative at the last 2 visits, down from 13 in January since starting Lexapro. Unfortunately this was causing erectile dysfunction - will d/c Lexapro and start  Wellbutrin - buPROPion (WELLBUTRIN XL) 150 MG 24 hr tablet; 1 tablet daily for a month then 1 tab twice a day  Dispense: 30 tablet; Refill: 0 - follow-up in 1 month  Patient education and anticipatory guidance given Patient agrees with treatment plan Follow-up in 2 weeks for injection or sooner as needed  Darlyne Russian PA-C

## 2016-07-22 ENCOUNTER — Other Ambulatory Visit: Payer: Self-pay

## 2016-07-22 MED ORDER — TESTOSTERONE CYPIONATE 200 MG/ML IM SOLN: 200.0000 mg | INTRAMUSCULAR | 0 refills | Status: DC

## 2016-07-25 ENCOUNTER — Ambulatory Visit (INDEPENDENT_AMBULATORY_CARE_PROVIDER_SITE_OTHER): Payer: BLUE CROSS/BLUE SHIELD | Admitting: Physician Assistant

## 2016-07-25 ENCOUNTER — Telehealth: Payer: Self-pay | Admitting: *Deleted

## 2016-07-25 DIAGNOSIS — E291 Testicular hypofunction: Secondary | ICD-10-CM

## 2016-07-25 MED ORDER — TESTOSTERONE CYPIONATE 200 MG/ML IM SOLN
200.0000 mg | Freq: Once | INTRAMUSCULAR | Status: AC
  Administered 2016-07-25: 200 mg via INTRAMUSCULAR

## 2016-07-25 NOTE — Progress Notes (Signed)
Patient was seen in office today for nurse visit for testosterone injection. Administered 35mL of testosterone in RUOQ of dorsal glute. Patient tolerated well with no complications.

## 2016-07-25 NOTE — Telephone Encounter (Signed)
Pre Authorization sent to cover my meds. [id number]

## 2016-07-30 ENCOUNTER — Telehealth: Payer: Self-pay | Admitting: *Deleted

## 2016-07-30 NOTE — Telephone Encounter (Signed)
Testosterone was approved. Pharm notified and patient notified via vm

## 2016-08-08 ENCOUNTER — Ambulatory Visit (INDEPENDENT_AMBULATORY_CARE_PROVIDER_SITE_OTHER): Payer: BLUE CROSS/BLUE SHIELD | Admitting: Physician Assistant

## 2016-08-08 VITALS — BP 141/80 | HR 80 | Wt 327.0 lb

## 2016-08-08 DIAGNOSIS — Z79899 Other long term (current) drug therapy: Secondary | ICD-10-CM

## 2016-08-08 DIAGNOSIS — F3341 Major depressive disorder, recurrent, in partial remission: Secondary | ICD-10-CM

## 2016-08-08 DIAGNOSIS — E291 Testicular hypofunction: Secondary | ICD-10-CM | POA: Diagnosis not present

## 2016-08-08 DIAGNOSIS — R030 Elevated blood-pressure reading, without diagnosis of hypertension: Secondary | ICD-10-CM | POA: Diagnosis not present

## 2016-08-08 DIAGNOSIS — Z5181 Encounter for therapeutic drug level monitoring: Secondary | ICD-10-CM

## 2016-08-08 DIAGNOSIS — Z79890 Hormone replacement therapy (postmenopausal): Secondary | ICD-10-CM

## 2016-08-08 MED ORDER — TESTOSTERONE CYPIONATE 200 MG/ML IM SOLN
200.0000 mg | Freq: Once | INTRAMUSCULAR | Status: AC
  Administered 2016-08-08: 200 mg via INTRAMUSCULAR

## 2016-08-08 NOTE — Patient Instructions (Addendum)
For your blood pressure: - Check blood pressure at home for the next 2 weeks - Check around the same time each day in a relaxed setting (rest for at least 3 minutes, legs uncrossed, arm above level or your heart) - Limit salt. Follow DASH eating plan - Follow-up in 2 weeks - Bring your BP log to your next testosterone injection  Labs - check testosterone level and hemoglobin on June 21st - you will need to have your blood drawn 1 week after your injection at around 8am   How to Take Your Blood Pressure Blood pressure is a measurement of how strongly your blood is pressing against the walls of your arteries. Arteries are blood vessels that carry blood from your heart throughout your body. Your health care provider takes your blood pressure at each office visit. You can also take your own blood pressure at home with a blood pressure machine. You may need to take your own blood pressure:  To confirm a diagnosis of high blood pressure (hypertension).  To monitor your blood pressure over time.  To make sure your blood pressure medicine is working. Supplies needed: To take your blood pressure, you will need a blood pressure machine. You can buy a blood pressure machine, or blood pressure monitor, at most drugstores or online. There are several types of home blood pressure monitors. When choosing one, consider the following:  Choose a monitor that has an arm cuff.  Choose a monitor that wraps snugly around your upper arm. You should be able to fit only one finger between your arm and the cuff.  Do not choose a monitor that measures your blood pressure from your wrist or finger. Your health care provider can suggest a reliable monitor that will meet your needs. How to prepare To get the most accurate reading, avoid the following for 30 minutes before you check your blood pressure:  Drinking caffeine.  Drinking alcohol.  Eating.  Smoking.  Exercising. Five minutes before you check  your blood pressure:  Empty your bladder.  Sit quietly without talking in a dining chair, rather than in a soft couch or armchair. How to take your blood pressure To check your blood pressure, follow the instructions in the manual that came with your blood pressure monitor. If you have a digital blood pressure monitor, the instructions may be as follows: 1. Sit up straight. 2. Place your feet on the floor. Do not cross your ankles or legs. 3. Rest your left arm at the level of your heart on a table or desk or on the arm of a chair. 4. Pull up your shirt sleeve. 5. Wrap the blood pressure cuff around the upper part of your left arm, 1 inch (2.5 cm) above your elbow. It is best to wrap the cuff around bare skin. 6. Fit the cuff snugly around your arm. You should be able to place only one finger between the cuff and your arm. 7. Position the cord inside the groove of your elbow. 8. Press the power button. 9. Sit quietly while the cuff inflates and deflates. 10. Read the digital reading on the monitor screen and write it down (record it). 11. Wait 2-3 minutes, then repeat the steps, starting at step 1. What does my blood pressure reading mean? A blood pressure reading consists of a higher number over a lower number. Ideally, your blood pressure should be below 120/80. The first ("top") number is called the systolic pressure. It is a measure of the  pressure in your arteries as your heart beats. The second ("bottom") number is called the diastolic pressure. It is a measure of the pressure in your arteries as the heart relaxes. Blood pressure is classified into four stages. The following are the stages for adults who do not have a short-term serious illness or a chronic condition. Systolic pressure and diastolic pressure are measured in a unit called mm Hg. Normal   Systolic pressure: below 553.  Diastolic pressure: below 80. Elevated   Systolic pressure: 748-270.  Diastolic pressure: below  80. Hypertension stage 1   Systolic pressure: 786-754.  Diastolic pressure: 49-20. Hypertension stage 2   Systolic pressure: 100 or above.  Diastolic pressure: 90 or above. You can have prehypertension or hypertension even if only the systolic or only the diastolic number in your reading is higher than normal. Follow these instructions at home:  Check your blood pressure as often as recommended by your health care provider.  Take your monitor to the next appointment with your health care provider to make sure:  That you are using it correctly.  That it provides accurate readings.  Be sure you understand what your goal blood pressure numbers are.  Tell your health care provider if you are having any side effects from blood pressure medicine. Contact a health care provider if:  Your blood pressure is consistently high. Get help right away if:  Your systolic blood pressure is higher than 180.  Your diastolic blood pressure is higher than 110. This information is not intended to replace advice given to you by your health care provider. Make sure you discuss any questions you have with your health care provider. Document Released: 09/29/2015 Document Revised: 12/12/2015 Document Reviewed: 09/29/2015 Elsevier Interactive Patient Education  2017 [employer].

## 2016-08-08 NOTE — Progress Notes (Signed)
HPI:                                                                Christopher [name] is a 46 y.o. male who presents to [place]: [place] today for Wellbutrin follow-up  Depression/Anxiety: taking Wellbutrin as augmentation to Viibryd for the last month without difficulty. Feels improvement of depression symptoms, but does endorse occasional irritability. Denies symptoms of mania/hypomania. Denies suicidal thinking. Denies auditory/visual hallucinations.  He is due for his biweekly testosterone injection today. Denies worsening sleep apnea. He is compliant with his CPAP nightly.   Past Medical History:  Diagnosis Date  . Depression   . Hyperlipidemia   . Hypogonadal obesity   . Lumbar degenerative disc disease   . Obesity   . OSA (obstructive sleep apnea) 12/02/2013   CPAP therapy on 17 cm H2O with a Wide size Resmed Nasal Mask Mirage FX mask and heated humidification   . Post traumatic stress disorder (PTSD)   . Prediabetes 2018   No past surgical history on file. Social History  Substance Use Topics  . Smoking status: Never Smoker  . Smokeless tobacco: Never Used  . Alcohol use Yes   family history is not on file.  ROS: negative except as noted in the HPI  Medications: Current Outpatient Prescriptions  Medication Sig Dispense Refill  . AMBULATORY NON FORMULARY MEDICATION CPAP therapy on 17 cm H2O with a Wide size Resmed Nasal Mask Mirage FX mask and heated humidification. Use nightly for sleep.  Dx: Obstructive Sleep Apnea 1 Units 0  . buPROPion (WELLBUTRIN XL) 150 MG 24 hr tablet Take 1 tablet (150 mg total) by mouth daily. 1 tablet daily for a month then 1 tab twice a day 30 tablet 5  . EPINEPHrine 0.3 mg/0.3 mL IJ SOAJ injection Inject into the muscle.    . meloxicam (MOBIC) 15 MG tablet One tab PO qAM with breakfast for 2 weeks, then daily prn pain. 30 tablet 3  . NEEDLE, DISP, 22 G 22G X 1-1/2" MISC Dispense intramuscular needles  for testosterone injection 50 each 1  . prazosin (MINIPRESS) 1 MG capsule   0  . simvastatin (ZOCOR) 10 MG tablet Take 1 tablet (10 mg total) by mouth daily. 90 tablet 1  . Syringe, Disposable, (2-3CC SYRINGE) 3 ML MISC Dispense package of disposable syringes for testosterone injection 100 each 1  . testosterone cypionate (DEPOTESTOSTERONE CYPIONATE) 200 MG/ML injection Inject 1 mL (200 mg total) into the muscle every 14 (fourteen) days. 10 mL 0  . Vilazodone HCl (VIIBRYD) 40 MG TABS Take 1 tablet (40 mg total) by mouth daily. 30 tablet 3  . doxepin (SINEQUAN) 50 MG capsule Take 50 mg by mouth at bedtime.  0   No current facility-administered medications for this visit.    No Known Allergies     Objective:  BP (!) 141/80   Pulse 80   Wt (!) 327 lb (148.3 kg)   BMI 49.72 kg/m  Gen: well-groomed, cooperative, not ill-appearing, no distress Pulm: Normal work of breathing, normal phonation Neuro: alert and oriented x 3, EOM's intact MSK: moving all extremities, normal gait and station Psych: good eye contact, euthymic mood, normal speech, organized thought content  Depression screen [facility] 2/9  08/08/2016 07/11/2016 06/28/2016 05/30/2016  Decreased Interest 1 1 0 2  Down, Depressed, Hopeless 0 0 1 2  PHQ - 2 Score 1 1 1 4   Altered sleeping 0 0 0 2  Tired, decreased energy 0 1 2 2   Change in appetite 1 1 2 3   Feeling bad or failure about yourself  0 0 0 2  Trouble concentrating 1 0 0 0  Moving slowly or fidgety/restless 0 0 0 0  Suicidal thoughts 0 0 0 0  PHQ-9 Score 3 3 5 13       Assessment and Plan: 46 y.o. male with   1. Recurrent major depressive disorder, in partial remission (HCC) - PHQ9 score of 3. Doing well with Wellbutrin augmentation  2. Hypogonadism in male - Depotestosterone 200mg  given in clinic today - checking total testosterone at 8am in 1 week - return in 2 weeks for next injection  3. Encounter for monitoring testosterone replacement therapy - CBC; Future -  Testosterone; Future  4. Elevated blood pressure reading BP Readings from Last 3 Encounters:  08/08/16 (!) 141/80  07/25/16 130/79  07/11/16 (!) 149/87  - checking BP's at home and provided with log - will bring log to his next testosterone nurse visit  Patient education and anticipatory guidance given Patient agrees with treatment plan Follow-up in 2 weeks for next TRT nurse visit or sooner as needed  Darlyne Russian PA-C

## 2016-08-11 ENCOUNTER — Encounter: Payer: Self-pay | Admitting: Physician Assistant

## 2016-08-11 DIAGNOSIS — R030 Elevated blood-pressure reading, without diagnosis of hypertension: Secondary | ICD-10-CM | POA: Insufficient documentation

## 2016-08-11 MED ORDER — BUPROPION HCL ER (XL) 150 MG PO TB24: 150.0000 mg | ORAL_TABLET | Freq: Every day | ORAL | 5 refills | Status: DC

## 2016-08-15 DIAGNOSIS — Z79899 Other long term (current) drug therapy: Secondary | ICD-10-CM | POA: Diagnosis not present

## 2016-08-15 DIAGNOSIS — Z5181 Encounter for therapeutic drug level monitoring: Secondary | ICD-10-CM | POA: Diagnosis not present

## 2016-08-15 LAB — CBC
HCT: 47.9 % (ref 38.5–50.0)
Hemoglobin: 15.5 g/dL (ref 13.2–17.1)
MCH: 26.4 pg — ABNORMAL LOW (ref 27.0–33.0)
MCHC: 32.4 g/dL (ref 32.0–36.0)
MCV: 81.5 fL (ref 80.0–100.0)
MPV: 10.4 fL (ref 7.5–12.5)
PLATELETS: 320 10*3/uL (ref 140–400)
RBC: 5.88 MIL/uL — AB (ref 4.20–5.80)
RDW: 17.4 % — ABNORMAL HIGH (ref 11.0–15.0)
WBC: 6.5 10*3/uL (ref 3.8–10.8)

## 2016-08-16 LAB — TESTOSTERONE: TESTOSTERONE: 715 ng/dL (ref 250–827)

## 2016-08-16 NOTE — Addendum Note (Signed)
Addended by: Nelson Chimes E on: 08/16/2016 11:27 AM   Modules accepted: Orders

## 2016-08-22 ENCOUNTER — Ambulatory Visit (INDEPENDENT_AMBULATORY_CARE_PROVIDER_SITE_OTHER): Payer: BLUE CROSS/BLUE SHIELD | Admitting: Physician Assistant

## 2016-08-22 VITALS — BP 137/65 | HR 79

## 2016-08-22 DIAGNOSIS — E291 Testicular hypofunction: Secondary | ICD-10-CM | POA: Diagnosis not present

## 2016-08-22 MED ORDER — TESTOSTERONE CYPIONATE 200 MG/ML IM SOLN
200.0000 mg | Freq: Once | INTRAMUSCULAR | Status: AC
  Administered 2016-08-22: 200 mg via INTRAMUSCULAR

## 2016-08-22 NOTE — Progress Notes (Signed)
Patient came into office today for testosterone injection. Denies chest pain, shortness of breath, headaches and problems associated with taking this medication. Patient states he has had no abnornal mood swings. Patient tolerated injection in RUOQ well without complications. Patient advised to schedule his next injection for 2 weeks from today. 

## 2016-09-05 ENCOUNTER — Ambulatory Visit (INDEPENDENT_AMBULATORY_CARE_PROVIDER_SITE_OTHER): Payer: BLUE CROSS/BLUE SHIELD | Admitting: Physician Assistant

## 2016-09-05 VITALS — BP 137/69 | HR 84

## 2016-09-05 DIAGNOSIS — E291 Testicular hypofunction: Secondary | ICD-10-CM

## 2016-09-05 MED ORDER — TESTOSTERONE CYPIONATE 200 MG/ML IM SOLN
200.0000 mg | Freq: Once | INTRAMUSCULAR | Status: AC
  Administered 2016-09-05: 200 mg via INTRAMUSCULAR

## 2016-09-05 NOTE — Progress Notes (Signed)
Patient came into office today for testosterone injection. Denies chest pain, shortness of breath, headaches and problems associated with taking this medication. Patient states he has had no abnornal mood swings. Patient does report he did come in for education on self administration of testosterone, but could not do it at home. States it was much more difficult to stick himself with such a large needle so he prefers to come in for nurse visits. Removed all needle/syringe Rx's from Pt's med list. Patient tolerated injection in [place] well without complications. Patient advised to schedule his next injection for 2 weeks from today.

## 2016-09-19 ENCOUNTER — Ambulatory Visit (INDEPENDENT_AMBULATORY_CARE_PROVIDER_SITE_OTHER): Payer: BLUE CROSS/BLUE SHIELD | Admitting: Physician Assistant

## 2016-09-19 VITALS — BP 136/81 | HR 79

## 2016-09-19 DIAGNOSIS — E291 Testicular hypofunction: Secondary | ICD-10-CM | POA: Diagnosis not present

## 2016-09-19 MED ORDER — TESTOSTERONE CYPIONATE 200 MG/ML IM SOLN
200.0000 mg | Freq: Once | INTRAMUSCULAR | Status: AC
  Administered 2016-09-19: 200 mg via INTRAMUSCULAR

## 2016-09-19 NOTE — Progress Notes (Signed)
Patient came into office today for testosterone injection. Denies chest pain, shortness of breath, headaches and problems associated with taking this medication. Patient states he has had no abnornal mood swings. Patient tolerated injection in ROUQ well without complications. Patient advised to schedule his next injection for 2 weeks from today. 

## 2016-09-26 DIAGNOSIS — H524 Presbyopia: Secondary | ICD-10-CM | POA: Diagnosis not present

## 2016-09-26 DIAGNOSIS — H52221 Regular astigmatism, right eye: Secondary | ICD-10-CM | POA: Diagnosis not present

## 2016-10-03 ENCOUNTER — Ambulatory Visit (INDEPENDENT_AMBULATORY_CARE_PROVIDER_SITE_OTHER): Payer: BLUE CROSS/BLUE SHIELD | Admitting: Physician Assistant

## 2016-10-03 VITALS — BP 142/74 | HR 93

## 2016-10-03 DIAGNOSIS — E291 Testicular hypofunction: Secondary | ICD-10-CM | POA: Diagnosis not present

## 2016-10-03 DIAGNOSIS — R030 Elevated blood-pressure reading, without diagnosis of hypertension: Secondary | ICD-10-CM

## 2016-10-03 MED ORDER — TESTOSTERONE CYPIONATE 200 MG/ML IM SOLN
200.0000 mg | Freq: Once | INTRAMUSCULAR | Status: AC
  Administered 2016-10-03: 200 mg via INTRAMUSCULAR

## 2016-10-03 NOTE — Progress Notes (Signed)
Patient came into office today for testosterone injection. Denies chest pain, shortness of breath, headaches and problems associated with taking this medication. Patient states he has had no abnornal mood swings. Patient's BP was elevated in office today. Spoke with PCP and advised him to start checking his blood pressure at home and bring a log in to his next appointment. Patient verbalized understanding. Patient tolerated injection in [place] well without complications. Patient advised to schedule his next injection for 2 weeks from today.

## 2016-10-17 ENCOUNTER — Ambulatory Visit (INDEPENDENT_AMBULATORY_CARE_PROVIDER_SITE_OTHER): Payer: BLUE CROSS/BLUE SHIELD | Admitting: Physician Assistant

## 2016-10-17 VITALS — BP 136/80 | HR 90

## 2016-10-17 DIAGNOSIS — R030 Elevated blood-pressure reading, without diagnosis of hypertension: Secondary | ICD-10-CM

## 2016-10-17 DIAGNOSIS — E291 Testicular hypofunction: Secondary | ICD-10-CM

## 2016-10-17 MED ORDER — TESTOSTERONE CYPIONATE 200 MG/ML IM SOLN
200.0000 mg | Freq: Once | INTRAMUSCULAR | Status: AC
  Administered 2016-10-17: 200 mg via INTRAMUSCULAR

## 2016-10-17 NOTE — Progress Notes (Signed)
Patient came into office today for testosterone injection. Denies chest pain, shortness of breath, headaches and problems associated with taking this medication. Patient states he has had no abnornal mood swings. Patient tolerated injection in RUOQ well without complications. Pt's BP was elevated on first check today. Advised him to keep a home log and bring that to his next testosterone injection appointment. Pt states he would remember next time. Patient advised to schedule his next injection for 2 weeks from today.

## 2016-10-29 ENCOUNTER — Ambulatory Visit (INDEPENDENT_AMBULATORY_CARE_PROVIDER_SITE_OTHER): Payer: BLUE CROSS/BLUE SHIELD | Admitting: Sports Medicine

## 2016-10-29 VITALS — BP 135/70 | HR 76

## 2016-10-29 DIAGNOSIS — E291 Testicular hypofunction: Secondary | ICD-10-CM | POA: Diagnosis not present

## 2016-10-29 DIAGNOSIS — R030 Elevated blood-pressure reading, without diagnosis of hypertension: Secondary | ICD-10-CM

## 2016-10-29 MED ORDER — TESTOSTERONE CYPIONATE 200 MG/ML IM SOLN
200.0000 mg | Freq: Once | INTRAMUSCULAR | Status: AC
  Administered 2016-10-29: 200 mg via INTRAMUSCULAR

## 2016-10-29 NOTE — Progress Notes (Signed)
Patient came into office today for testosterone injection. Denies chest pain, shortness of breath, headaches and problems associated with taking this medication. Patient states he has had no abnornal mood swings. Patient has been monitoring his BP at home, it was elevated at his last few office visits. Pt's BP readings are as follows: 10/25/16: 135/83 (am), 136/87 (pm) 10/26/16: 140/85 (am), 139/86 (pm) 10/27/16: 138/87 (am), 139/87 (pm) 10/28/16: 130/86 (am) 10/29/16: 136/93 (am)  Pt's BP was at goal today in office. Advised I would give his home BP log to Provider for review and we would contact him with any changes. Pt reports he has made some dietary changes to help bring his BP down. Patient tolerated injection in [place] well without complications. Patient advised to schedule his next injection for 2 weeks from today.

## 2016-10-31 ENCOUNTER — Ambulatory Visit: Payer: Self-pay

## 2016-11-15 ENCOUNTER — Ambulatory Visit (INDEPENDENT_AMBULATORY_CARE_PROVIDER_SITE_OTHER): Payer: BLUE CROSS/BLUE SHIELD | Admitting: Physician Assistant

## 2016-11-15 ENCOUNTER — Telehealth: Payer: Self-pay | Admitting: *Deleted

## 2016-11-15 VITALS — BP 130/75 | HR 84 | Wt 325.0 lb

## 2016-11-15 DIAGNOSIS — E291 Testicular hypofunction: Secondary | ICD-10-CM | POA: Diagnosis not present

## 2016-11-15 DIAGNOSIS — Z5181 Encounter for therapeutic drug level monitoring: Secondary | ICD-10-CM

## 2016-11-15 DIAGNOSIS — Z79890 Hormone replacement therapy (postmenopausal): Principal | ICD-10-CM

## 2016-11-15 MED ORDER — TESTOSTERONE CYPIONATE 200 MG/ML IM SOLN
200.0000 mg | Freq: Once | INTRAMUSCULAR | Status: AC
  Administered 2016-11-15: 200 mg via INTRAMUSCULAR

## 2016-11-15 NOTE — Progress Notes (Signed)
Pt here for testosterone injection no SOB,CP or mood swings. Injection tolerated well given in LUOQ. Pt to RTC in 2 wks.Christopher [name]

## 2016-11-15 NOTE — Telephone Encounter (Signed)
Called pt and advised that he will need to have his labs checked next wk between 730-830 AM. Pt voiced understanding and agreed, labs faxed.Audelia Hives Ty Ty

## 2016-11-22 DIAGNOSIS — Z5181 Encounter for therapeutic drug level monitoring: Secondary | ICD-10-CM | POA: Diagnosis not present

## 2016-11-22 LAB — HEMATOCRIT: HEMATOCRIT: 51.3 % — AB (ref 38.5–50.0)

## 2016-11-23 LAB — TESTOSTERONE: Testosterone: 386 ng/dL (ref 250–827)

## 2016-11-24 ENCOUNTER — Encounter: Payer: Self-pay | Admitting: Physician Assistant

## 2016-11-24 ENCOUNTER — Other Ambulatory Visit: Payer: Self-pay | Admitting: Physician Assistant

## 2016-11-24 DIAGNOSIS — R718 Other abnormality of red blood cells: Secondary | ICD-10-CM | POA: Insufficient documentation

## 2016-11-24 DIAGNOSIS — Z5181 Encounter for therapeutic drug level monitoring: Secondary | ICD-10-CM

## 2016-11-24 DIAGNOSIS — E291 Testicular hypofunction: Secondary | ICD-10-CM

## 2016-11-24 DIAGNOSIS — Z79890 Hormone replacement therapy (postmenopausal): Secondary | ICD-10-CM

## 2016-11-24 MED ORDER — ASPIRIN EC 81 MG PO TBEC: 81.0000 mg | DELAYED_RELEASE_TABLET | Freq: Every day | ORAL | 3 refills | Status: DC

## 2016-11-24 MED ORDER — TESTOSTERONE CYPIONATE 200 MG/ML IM SOLN: 175.0000 mg | INTRAMUSCULAR | 5 refills | Status: DC

## 2016-11-24 NOTE — Progress Notes (Signed)
Serum testosterone is in a normal range, but has decreased compared to 3 months ago despite maximum dose. Unfortunately, hematocrit is elevated. Will need to decrease to dose to 175mg  Q2weeks and recheck testosterone and Hct in 3 months. Patient has multiple CVD risk factors including OSA, HLD, severe obesity LDL is at 70 on statin BP has been <130/80 Will add daily baby aspirin for primary prevention

## 2016-11-29 ENCOUNTER — Ambulatory Visit (INDEPENDENT_AMBULATORY_CARE_PROVIDER_SITE_OTHER): Payer: BLUE CROSS/BLUE SHIELD | Admitting: Family Medicine

## 2016-11-29 VITALS — BP 123/72 | HR 84 | Resp 18 | Wt 329.6 lb

## 2016-11-29 DIAGNOSIS — E291 Testicular hypofunction: Secondary | ICD-10-CM

## 2016-11-29 MED ORDER — TESTOSTERONE CYPIONATE 200 MG/ML IM SOLN
175.0000 mg | Freq: Once | INTRAMUSCULAR | Status: AC
  Administered 2016-11-29: 175 mg via INTRAMUSCULAR

## 2016-11-29 NOTE — Progress Notes (Signed)
Pt is here for a testosterone injection. Denies chest pain, SOB, headaches, mood changes or medication problems. Pt tolerated injection into RUOQ without complications. Pt advised to return to clinic in 2 weeks for repeat injection.

## 2016-11-29 NOTE — Progress Notes (Signed)
Agree with above.  [name], MD  

## 2016-12-10 ENCOUNTER — Ambulatory Visit: Payer: Self-pay

## 2016-12-13 ENCOUNTER — Ambulatory Visit (INDEPENDENT_AMBULATORY_CARE_PROVIDER_SITE_OTHER): Payer: BLUE CROSS/BLUE SHIELD | Admitting: Physician Assistant

## 2016-12-13 VITALS — BP 139/92 | HR 75 | Ht 68.0 in | Wt 331.0 lb

## 2016-12-13 DIAGNOSIS — E291 Testicular hypofunction: Secondary | ICD-10-CM | POA: Diagnosis not present

## 2016-12-13 MED ORDER — TESTOSTERONE CYPIONATE 200 MG/ML IM SOLN
175.0000 mg | INTRAMUSCULAR | Status: DC
  Administered 2016-12-13: 175 mg via INTRAMUSCULAR

## 2016-12-13 NOTE — Progress Notes (Signed)
Patient is here for Testoterone injection. Denies schest pains,shortness of breath, headaches, and problems with medication or mood swings.  Patient tolerated injection well without complications. Patient advised to schedule next injection in 14 days.

## 2016-12-26 ENCOUNTER — Ambulatory Visit (INDEPENDENT_AMBULATORY_CARE_PROVIDER_SITE_OTHER): Payer: BLUE CROSS/BLUE SHIELD | Admitting: Physician Assistant

## 2016-12-26 ENCOUNTER — Telehealth: Payer: Self-pay

## 2016-12-26 ENCOUNTER — Encounter: Payer: Self-pay | Admitting: Physician Assistant

## 2016-12-26 VITALS — BP 138/79 | HR 70 | Wt 320.0 lb

## 2016-12-26 DIAGNOSIS — E291 Testicular hypofunction: Secondary | ICD-10-CM | POA: Diagnosis not present

## 2016-12-26 DIAGNOSIS — E782 Mixed hyperlipidemia: Secondary | ICD-10-CM

## 2016-12-26 MED ORDER — TESTOSTERONE CYPIONATE 200 MG/ML IM SOLN
175.0000 mg | Freq: Once | INTRAMUSCULAR | Status: AC
  Administered 2016-12-26: 175 mg via INTRAMUSCULAR

## 2016-12-26 MED ORDER — SIMVASTATIN 10 MG PO TABS: 10.0000 mg | ORAL_TABLET | Freq: Every day | ORAL | 1 refills | Status: DC

## 2016-12-26 NOTE — Telephone Encounter (Signed)
Sent!

## 2016-12-26 NOTE — Telephone Encounter (Signed)
Pt needs a refill on simvastatin.  Please advise.

## 2016-12-26 NOTE — Progress Notes (Signed)
Pt is here for a testosterone injection.  Denies CP, SOB, HA or problems with medication or mood changes. Testosterone injection into RUOQ.  Patient tolerated injection well without complications.  Pt advised to schedule an appointment in 14 days.

## 2017-01-10 ENCOUNTER — Ambulatory Visit (INDEPENDENT_AMBULATORY_CARE_PROVIDER_SITE_OTHER): Payer: BLUE CROSS/BLUE SHIELD | Admitting: Physician Assistant

## 2017-01-10 VITALS — BP 144/80 | HR 74 | Wt 322.0 lb

## 2017-01-10 DIAGNOSIS — E291 Testicular hypofunction: Secondary | ICD-10-CM | POA: Diagnosis not present

## 2017-01-10 DIAGNOSIS — I10 Essential (primary) hypertension: Secondary | ICD-10-CM | POA: Diagnosis not present

## 2017-01-10 DIAGNOSIS — Z23 Encounter for immunization: Secondary | ICD-10-CM

## 2017-01-10 DIAGNOSIS — E1159 Type 2 diabetes mellitus with other circulatory complications: Secondary | ICD-10-CM | POA: Insufficient documentation

## 2017-01-10 DIAGNOSIS — I152 Hypertension secondary to endocrine disorders: Secondary | ICD-10-CM | POA: Insufficient documentation

## 2017-01-10 MED ORDER — IRBESARTAN 150 MG PO TABS: 150.0000 mg | ORAL_TABLET | Freq: Every day | ORAL | 5 refills | Status: DC

## 2017-01-10 MED ORDER — TESTOSTERONE CYPIONATE 200 MG/ML IM SOLN
175.0000 mg | Freq: Once | INTRAMUSCULAR | Status: AC
  Administered 2017-01-10: 175 mg via INTRAMUSCULAR

## 2017-01-10 NOTE — Progress Notes (Signed)
BP Readings from Last 3 Encounters:  12/26/16 138/79  12/13/16 (!) 139/92  11/29/16 123/72    BP continue to be elevated at testosterone visits Multiple cardiac risk factors Starting ARB Recheck in 2 weeks

## 2017-01-10 NOTE — Progress Notes (Signed)
Patient came into office today for testosterone injection. While in office, Pt's BP was elevated on both BP checks. Denies chest pain, shortness of breath, headaches and problems associated with taking this medication. Patient states he has had no abnornal mood swings. Patient tolerated injection in [place] well without complications. Patient advised to schedule his next injection for 2 weeks from today. Per PCP, Pt to start new BP Rx and have it monitored at upcoming 2 week testosterone injection visit.   While in clinic today, Pt wanted to get his flu vaccination. Patient was given a flu shot questionnaire prior to administration of immunization. All questions were answered no. Patient tolerated injection of flu immunization in Left deltoid well, with no immediate complications. An information pamphlet regarding flu shot immunization and frequently asked questions was given to Patient prior to leaving clinic. Advised to contact our office with any questions/concerns.

## 2017-01-24 ENCOUNTER — Ambulatory Visit (INDEPENDENT_AMBULATORY_CARE_PROVIDER_SITE_OTHER): Payer: BLUE CROSS/BLUE SHIELD | Admitting: Physician Assistant

## 2017-01-24 VITALS — BP 139/80 | HR 73

## 2017-01-24 DIAGNOSIS — E291 Testicular hypofunction: Secondary | ICD-10-CM | POA: Diagnosis not present

## 2017-01-24 MED ORDER — TESTOSTERONE CYPIONATE 200 MG/ML IM SOLN
175.0000 mg | Freq: Once | INTRAMUSCULAR | Status: AC
  Administered 2017-01-24: 175 mg via INTRAMUSCULAR

## 2017-01-24 NOTE — Progress Notes (Signed)
   Subjective:    Patient ID: Christopher [name], male    DOB: 17-Jul-1970, 46 y.o.   MRN: [id number]  HPI  ABHI MOCCIA is here for a testosterone injection. Denies chest pain, shortness of breath, headaches or mood changes.    Review of Systems     Objective:   Physical Exam        Assessment & Plan:  Patient tolerated injection well without complications. Patient advised to schedule next injection 14 days from today.

## 2017-02-07 ENCOUNTER — Ambulatory Visit (INDEPENDENT_AMBULATORY_CARE_PROVIDER_SITE_OTHER): Payer: BLUE CROSS/BLUE SHIELD | Admitting: Physician Assistant

## 2017-02-07 VITALS — BP 138/84 | HR 76

## 2017-02-07 DIAGNOSIS — E291 Testicular hypofunction: Secondary | ICD-10-CM | POA: Diagnosis not present

## 2017-02-07 MED ORDER — TESTOSTERONE CYPIONATE 200 MG/ML IM SOLN
175.0000 mg | Freq: Once | INTRAMUSCULAR | Status: AC
  Administered 2017-02-07: 175 mg via INTRAMUSCULAR

## 2017-02-07 MED ORDER — TESTOSTERONE 20.25 MG/ACT (1.62%) TD GEL: TRANSDERMAL | 1 refills | Status: DC

## 2017-02-07 NOTE — Progress Notes (Signed)
Patient came into office today for testosterone injection. Denies chest pain, shortness of breath, headaches and problems associated with taking this medication. Patient states he has had no abnornal mood swings. Patient tolerated injection in [place] well without complications.   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 Pt does not want to self inject. He would like PCP to send over Rx for testosterone gel. Routing.

## 2017-02-07 NOTE — Progress Notes (Signed)
Switching from IM testosterone to Androgel. Instruct patient to wait to start Androgel until it has been 2 weeks since his last injection. Apply 2 pumps topically to clean, dry, intact skin of shoulders and upper arms daily Store away from children

## 2017-02-07 NOTE — Progress Notes (Signed)
Left VM of PCP's recommendations.

## 2017-02-11 DIAGNOSIS — G4733 Obstructive sleep apnea (adult) (pediatric): Secondary | ICD-10-CM | POA: Diagnosis not present

## 2017-02-18 ENCOUNTER — Telehealth: Payer: Self-pay | Admitting: *Deleted

## 2017-02-18 NOTE — Telephone Encounter (Signed)
androgel pump has been approved through insurance. Effective dates of the authorization are 02/13/2017-05/05/2038. Pharmacy and patient notified

## 2017-04-15 ENCOUNTER — Other Ambulatory Visit: Payer: Self-pay

## 2017-04-15 ENCOUNTER — Emergency Department
Admission: EM | Admit: 2017-04-15 | Discharge: 2017-04-15 | Disposition: A | Discharge status: Home / Self Care | Payer: BLUE CROSS/BLUE SHIELD | Source: Home / Self Care | Attending: Family Medicine | Admitting: Family Medicine

## 2017-04-15 DIAGNOSIS — B9789 Other viral agents as the cause of diseases classified elsewhere: Secondary | ICD-10-CM | POA: Diagnosis not present

## 2017-04-15 DIAGNOSIS — J069 Acute upper respiratory infection, unspecified: Secondary | ICD-10-CM

## 2017-04-15 MED ORDER — IPRATROPIUM BROMIDE 0.06 % NA SOLN: 2.0000 | Freq: Four times a day (QID) | NASAL | 1 refills | Status: DC

## 2017-04-15 MED ORDER — BENZONATATE 100 MG PO CAPS: 100.0000 mg | ORAL_CAPSULE | Freq: Three times a day (TID) | ORAL | 0 refills | Status: DC

## 2017-04-15 NOTE — ED Triage Notes (Signed)
Started Saturday with a cough a congestion.  Denies fever.

## 2017-04-15 NOTE — ED Provider Notes (Signed)
[name]    CSN: [id number] Arrival date & time: 04/15/17  0827     History   Chief Complaint Chief Complaint  Patient presents with  . Cough  . Nasal Congestion    HPI Christopher [name] is a 46 y.o. male.   HPI Christopher [name] is a 46 y.o. male presenting to UC with c/o dry hacking cough that started about 4 days ago. Cough is worse at night and was worse this morning when he was shoveling snow for the [place]. Denies hx of asthma.  Cough causes chest soreness at times. Denies fever, chills, n/v/d. He has tried Delsym, Dayquil and Nyquil with mild relief.  Others at home have also been sick.  BP elevated. He has also been taking OTC cough/cold medication which is likely making it more elevated.      Past Medical History:  Diagnosis Date  . Depression   . Hyperlipidemia   . Hypogonadal obesity   . Lumbar degenerative disc disease   . Obesity   . OSA (obstructive sleep apnea) 12/02/2013   CPAP therapy on 17 cm H2O with a Wide size Resmed Nasal Mask [place] FX mask and heated humidification   . Post traumatic stress disorder (PTSD)   . Prediabetes 2018    Patient Active Problem List   Diagnosis Date Noted  . Essential hypertension 01/10/2017  . Elevated hematocrit 11/24/2016  . Elevated blood pressure reading 08/11/2016  . Hypogonadism in male 06/01/2016  . Prediabetes 06/01/2016  . Abnormal prostate specific antigen (PSA) 06/01/2016  . Moderate episode of recurrent major depressive disorder ([place]) 05/30/2016  . Chronic post-traumatic stress disorder (PTSD) 05/30/2016  . Nightmares associated with chronic post-traumatic stress disorder 05/30/2016  . Lumbar degenerative disc disease 02/05/2016  . Insomnia 12/22/2015  . OSA (obstructive sleep apnea) 12/02/2013  . Severe obesity (BMI >= 40) ([place]) 12/02/2013  . Family history of prostate cancer 12/02/2013  . Hyperlipidemia     History reviewed. No pertinent surgical history.     Home  Medications    Prior to Admission medications   Medication Sig Start Date End Date Taking? Authorizing Provider  AMBULATORY NON FORMULARY MEDICATION CPAP therapy on 17 cm H2O with a Wide size Resmed Nasal Mask [place] FX mask and heated humidification. Use nightly for sleep.  Dx: Obstructive Sleep Apnea 02/09/15   Marcial Pacas, DO  aspirin EC 81 MG tablet Take 1 tablet (81 mg total) by mouth daily. 11/24/16   Trixie Dredge, PA-C  benzonatate (TESSALON) 100 MG capsule Take 1-2 capsules (100-200 mg total) by mouth every 8 (eight) hours. 04/15/17   Noe Gens, PA-C  buPROPion (WELLBUTRIN XL) 150 MG 24 hr tablet Take 1 tablet (150 mg total) by mouth daily. 1 tablet daily for a month then 1 tab twice a day 08/11/16   Trixie Dredge, PA-C  doxepin (SINEQUAN) 50 MG capsule Take 50 mg by mouth at bedtime. 06/18/16   [provider]  EPINEPHrine 0.3 mg/0.3 mL IJ SOAJ injection Inject into the muscle. 11/25/13   [provider]  ipratropium (ATROVENT) 0.06 % nasal spray Place 2 sprays into both nostrils 4 (four) times daily. 04/15/17   Noe Gens, PA-C  irbesartan (AVAPRO) 150 MG tablet Take 1 tablet (150 mg total) by mouth daily. 01/10/17   Trixie Dredge, PA-C  meloxicam (MOBIC) 15 MG tablet One tab PO qAM with breakfast for 2 weeks, then daily prn pain. 02/05/16   Aundria Mems  J, MD  prazosin (MINIPRESS) 1 MG capsule  05/17/16   [provider]  simvastatin (ZOCOR) 10 MG tablet Take 1 tablet (10 mg total) by mouth daily. 12/26/16   Trixie Dredge, PA-C  Testosterone (ANDROGEL PUMP) 20.25 MG/ACT (1.62%) GEL 2 pump actuations or 2.5 g of gel applied topically once daily in the morning to clean, dry, intact skin of the shoulders and upper arms 02/07/17   Trixie Dredge, PA-C  Vilazodone HCl (VIIBRYD) 40 MG TABS Take 1 tablet (40 mg total) by mouth daily. 12/22/15   Marcial Pacas, DO    Family History Family  History  Problem Relation Age of Onset  . Prostate cancer Unknown     Social History Social History   Tobacco Use  . Smoking status: Never Smoker  . Smokeless tobacco: Never Used  Substance Use Topics  . Alcohol use: Yes  . Drug use: No     Allergies   Patient has no known allergies.   Review of Systems Review of Systems  Constitutional: Negative for chills and fever.  HENT: Positive for congestion and postnasal drip. Negative for ear pain, sore throat, trouble swallowing and voice change.   Respiratory: Positive for cough. Negative for shortness of breath.   Cardiovascular: Negative for chest pain and palpitations.  Gastrointestinal: Negative for abdominal pain, diarrhea, nausea and vomiting.  Musculoskeletal: Negative for arthralgias, back pain and myalgias.  Skin: Negative for rash.     Physical Exam Triage Vital Signs ED Triage Vitals  Enc Vitals Group     BP 04/15/17 0855 (!) 179/102     Pulse Rate 04/15/17 0855 83     Resp --      Temp 04/15/17 0855 98.3 F (36.8 C)     Temp Source 04/15/17 0855 Oral     SpO2 04/15/17 0855 97 %     Weight 04/15/17 0856 (!) 329 lb (149.2 kg)     Height 04/15/17 0856 5\' 8"  (1.727 m)     Head Circumference --      Peak Flow --      Pain Score 04/15/17 0856 0     Pain Loc --      Pain Edu? --      Excl. in [place]? --    No data found.  Updated Vital Signs BP (!) 179/102 (BP Location: Right Arm)   Pulse 83   Temp 98.3 F (36.8 C) (Oral)   Ht 5\' 8"  (1.727 m)   Wt (!) 329 lb (149.2 kg)   SpO2 97%   BMI 50.02 kg/m   Visual Acuity Right Eye Distance:   Left Eye Distance:   Bilateral Distance:    Right Eye Near:   Left Eye Near:    Bilateral Near:     Physical Exam  Constitutional: He is oriented to person, place, and time. He appears well-developed and well-nourished. No distress.  HENT:  Head: Normocephalic and atraumatic.  Right Ear: Tympanic membrane normal.  Left Ear: Tympanic membrane normal.  Nose: Nose  normal.  Mouth/Throat: Uvula is midline, oropharynx is clear and moist and mucous membranes are normal.  Eyes: EOM are normal.  Neck: Normal range of motion. Neck supple.  Cardiovascular: Normal rate and regular rhythm.  Pulmonary/Chest: Effort normal and breath sounds normal. No stridor. No respiratory distress. He has no wheezes. He has no rales.  Musculoskeletal: Normal range of motion.  Lymphadenopathy:    He has no cervical adenopathy.  Neurological: He is alert and oriented  to person, place, and time.  Skin: Skin is warm and dry. He is not diaphoretic.  Psychiatric: He has a normal mood and affect. His behavior is normal.  Nursing note and vitals reviewed.    UC Treatments / Results  Labs (all labs ordered are listed, but only abnormal results are displayed) Labs Reviewed - No data to display  EKG  EKG Interpretation None       Radiology No results found.  Procedures Procedures (including critical care time)  Medications Ordered in UC Medications - No data to display   Initial Impression / Assessment and Plan / UC Course  I have reviewed the triage vital signs and the nursing notes.  Pertinent labs & imaging results that were available during my care of the patient were reviewed by me and considered in my medical decision making (see chart for details).     Hx and exam c/w viral illness. Encouraged symptomatic treatment.   Encouraged f/u with PCP in 1 week if not improving.    Final Clinical Impressions(s) / UC Diagnoses   Final diagnoses:  Viral URI with cough    ED Discharge Orders        Ordered    benzonatate (TESSALON) 100 MG capsule  Every 8 hours     04/15/17 0911    ipratropium (ATROVENT) 0.06 % nasal spray  4 times daily     04/15/17 0911       Controlled Substance Prescriptions [place] Controlled Substance Registry consulted? Not Applicable   Tyrell Antonio 04/15/17 [id number]

## 2017-06-02 ENCOUNTER — Encounter: Payer: Self-pay | Admitting: Physician Assistant

## 2017-06-02 ENCOUNTER — Ambulatory Visit (INDEPENDENT_AMBULATORY_CARE_PROVIDER_SITE_OTHER): Payer: BLUE CROSS/BLUE SHIELD | Admitting: Physician Assistant

## 2017-06-02 VITALS — BP 146/83 | HR 72 | Wt 328.0 lb

## 2017-06-02 DIAGNOSIS — N529 Male erectile dysfunction, unspecified: Secondary | ICD-10-CM

## 2017-06-02 DIAGNOSIS — Z3009 Encounter for other general counseling and advice on contraception: Secondary | ICD-10-CM

## 2017-06-02 DIAGNOSIS — Z5181 Encounter for therapeutic drug level monitoring: Secondary | ICD-10-CM | POA: Insufficient documentation

## 2017-06-02 DIAGNOSIS — G4733 Obstructive sleep apnea (adult) (pediatric): Secondary | ICD-10-CM | POA: Diagnosis not present

## 2017-06-02 DIAGNOSIS — I10 Essential (primary) hypertension: Secondary | ICD-10-CM | POA: Diagnosis not present

## 2017-06-02 DIAGNOSIS — E291 Testicular hypofunction: Secondary | ICD-10-CM

## 2017-06-02 DIAGNOSIS — Z79899 Other long term (current) drug therapy: Secondary | ICD-10-CM

## 2017-06-02 DIAGNOSIS — R972 Elevated prostate specific antigen [PSA]: Secondary | ICD-10-CM | POA: Diagnosis not present

## 2017-06-02 DIAGNOSIS — R718 Other abnormality of red blood cells: Secondary | ICD-10-CM | POA: Diagnosis not present

## 2017-06-02 DIAGNOSIS — R7303 Prediabetes: Secondary | ICD-10-CM | POA: Diagnosis not present

## 2017-06-02 DIAGNOSIS — E119 Type 2 diabetes mellitus without complications: Secondary | ICD-10-CM | POA: Diagnosis not present

## 2017-06-02 MED ORDER — IRBESARTAN 150 MG PO TABS: 150.0000 mg | ORAL_TABLET | Freq: Every day | ORAL | 5 refills | Status: DC

## 2017-06-02 MED ORDER — IRBESARTAN 300 MG PO TABS: 300.0000 mg | ORAL_TABLET | Freq: Every day | ORAL | 5 refills | Status: DC

## 2017-06-02 MED ORDER — SILDENAFIL CITRATE 20 MG PO TABS
20.0000 mg | ORAL_TABLET | ORAL | 2 refills | Status: DC | PRN
Start: 2017-06-02 — End: 2019-10-07

## 2017-06-02 NOTE — Patient Instructions (Signed)
For your blood pressure: - Goal <130/80 - baby aspirin 81 mg daily to help prevent heart attack/stroke - monitor and log blood pressures at home - check around the same time each day in a relaxed setting - Limit salt to <2000 mg/day - Follow DASH eating plan - limit alcohol to 2 standard drinks per day for men and 1 per day for women - avoid tobacco products - weight loss: 7% of current body weight - follow-up every 6 months for your blood pressure   For ED: - take 1-2 tablets, 30-60 minutes prior to sexual activity. Max dose is 5 tablets. Do not use more than once daily.   Erectile Dysfunction Erectile dysfunction (ED) is the inability to get or keep an erection in order to have sexual intercourse. Erectile dysfunction may include:  Inability to get an erection.  Lack of enough hardness of the erection to allow penetration.  Loss of the erection before sex is finished.  What are the causes? This condition may be caused by:  Certain medicines, such as: ? Pain relievers. ? Antihistamines. ? Antidepressants. ? Blood pressure medicines. ? Water pills (diuretics). ? Ulcer medicines. ? Muscle relaxants. ? Drugs.  Excessive drinking.  Psychological causes, such as: ? Anxiety. ? Depression. ? Sadness. ? Exhaustion. ? Performance fear. ? Stress.  Physical causes, such as: ? Artery problems. This may include diabetes, smoking, liver disease, or atherosclerosis. ? High blood pressure. ? Hormonal problems, such as low testosterone. ? Obesity. ? Nerve problems. This may include back or pelvic injuries, diabetes mellitus, multiple sclerosis, or Parkinson disease.  What are the signs or symptoms? Symptoms of this condition include:  Inability to get an erection.  Lack of enough hardness of the erection to allow penetration.  Loss of the erection before sex is finished.  Normal erections at some times, but with frequent unsatisfactory episodes.  Low sexual  satisfaction in either partner due to erection problems.  A curved penis occurring with erection. The curve may cause pain or the penis may be too curved to allow for intercourse.  Never having nighttime erections.  How is this diagnosed? This condition is often diagnosed by:  Performing a physical exam to find other diseases or specific problems with the penis.  Asking you detailed questions about the problem.  Performing blood tests to check for diabetes mellitus or to measure hormone levels.  Performing other tests to check for underlying health conditions.  Performing an ultrasound exam to check for scarring.  Performing a test to check blood flow to the penis.  Doing a sleep study at home to measure nighttime erections.  How is this treated? This condition may be treated by:  Medicine taken by mouth to help you achieve an erection (oral medicine).  Hormone replacement therapy to replace low testosterone levels.  Medicine that is injected into the penis. Your health care provider may instruct you how to give yourself these injections at home.  Vacuum pump. This is a pump with a ring on it. The pump and ring are placed on the penis and used to create pressure that helps the penis become erect.  Penile implant surgery. In this procedure, you may receive: ? An inflatable implant. This consists of cylinders, a pump, and a reservoir. The cylinders can be inflated with a fluid that helps to create an erection, and they can be deflated after intercourse. ? A semi-rigid implant. This consists of two silicone rubber rods. The rods provide some rigidity. They are  also flexible, so the penis can both curve downward in its normal position and become straight for sexual intercourse.  Blood vessel surgery, to improve blood flow to the penis. During this procedure, a blood vessel from a different part of the body is placed into the penis to allow blood to flow around (bypass) damaged or  blocked blood vessels.  Lifestyle changes, such as exercising more, losing weight, and quitting smoking.  Follow these instructions at home: Medicines  Take over-the-counter and prescription medicines only as told by your health care provider. Do not increase the dosage without first discussing it with your health care provider.  If you are using self-injections, perform injections as directed by your health care provider. Make sure to avoid any veins that are on the surface of the penis. After giving an injection, apply pressure to the injection site for 5 minutes. General instructions  Exercise regularly, as directed by your health care provider. Work with your health care provider to lose weight, if needed.  Do not use any products that contain nicotine or tobacco, such as cigarettes and e-cigarettes. If you need help quitting, ask your health care provider.  Before using a vacuum pump, read the instructions that come with the pump and discuss any questions with your health care provider.  Keep all follow-up visits as told by your health care provider. This is important. Contact a health care provider if:  You feel nauseous.  You vomit. Get help right away if:  You are taking oral or injectable medicines and you have an erection that lasts longer than 4 hours. If your health care provider is unavailable, go to the nearest emergency room for evaluation. An erection that lasts much longer than 4 hours can result in permanent damage to your penis.  You have severe pain in your groin or abdomen.  You develop redness or severe swelling of your penis.  You have redness spreading up into your groin or lower abdomen.  You are unable to urinate.  You experience chest pain or a rapid heart beat (palpitations) after taking oral medicines. Summary  Erectile dysfunction (ED) is the inability to get or keep an erection during sexual intercourse. This problem can usually be treated  successfully.  This condition is diagnosed based on a physical exam, your symptoms, and tests to determine the cause. Treatment varies depending on the cause, and may include medicines, hormone therapy, surgery, or vacuum pump.  You may need follow-up visits to make sure that you are using your medicines or devices correctly.  Get help right away if you are taking or injecting medicines and you have an erection that lasts longer than 4 hours. This information is not intended to replace advice given to you by your health care provider. Make sure you discuss any questions you have with your health care provider. Document Released: 04/19/2000 Document Revised: 05/08/2016 Document Reviewed: 05/08/2016 Elsevier Interactive Patient Education  2017 [employer].

## 2017-06-02 NOTE — Progress Notes (Signed)
HPI:                                                                Christopher [name] is a 47 y.o. male who presents to [facility]: [facility] today for medication management  HTN: taking Irbesartan daily. Compliant with medications. Ran out of his medication about 1 week ago. Checks BP's at home. BP range140's/80's-90's. He is sleep apnea and is compliant with his CPAP. Denies vision change, headache, chest pain with exertion, orthopnea, lightheadedness, syncope and edema. Risk factors include: male sex, obesity,    Hypogonadism: switched to Androgel 3 months ago. Denies symptoms of low testosterone including low libido, lack of energy, loss of height, deteriorated work performance, and falling asleep after dinner. He does endorse erectile dysfunction symptoms. Family hx significant for prostate cancer. SHIM    Row Name 06/15/17 1742         SHIM: Over the last 6 months:   How do you rate your confidence that you could get and keep an erection?  Low     When you had erections with sexual stimulation, how often were your erections hard enough for penetration (entering your partner)?  A Few Times (much less than half the time)     During sexual intercourse, how often were you able to maintain your erection after you had penetrated (entered) your partner?  A Few Times (much less than half the time)     During sexual intercourse, how difficult was it to maintain your erection to completion of intercourse?  Very Difficult     When you attempted sexual intercourse, how often was it satisfactory for you?  A Few Times (much less than half the time)       SHIM Total Score   SHIM  10        Lastly, he is requesting a referral to Urology for a vasectomy eval.  Past Medical History:  Diagnosis Date  . Depression   . Hyperlipidemia   . Hypogonadal obesity   . Lumbar degenerative disc disease   . Obesity   . OSA (obstructive sleep apnea) 12/02/2013   CPAP  therapy on 17 cm H2O with a Wide size Resmed Nasal Mask Mirage FX mask and heated humidification   . Post traumatic stress disorder (PTSD)   . Prediabetes 2018   No past surgical history on file. Social History   Tobacco Use  . Smoking status: Never Smoker  . Smokeless tobacco: Never Used  Substance Use Topics  . Alcohol use: Yes   family history includes Prostate cancer in his unknown relative.    ROS: negative except as noted in the HPI  Medications: Current Outpatient Medications  Medication Sig Dispense Refill  . AMBULATORY NON FORMULARY MEDICATION CPAP therapy on 17 cm H2O with a Wide size Resmed Nasal Mask Mirage FX mask and heated humidification. Use nightly for sleep.  Dx: Obstructive Sleep Apnea 1 Units 0  . aspirin EC 81 MG tablet Take 1 tablet (81 mg total) by mouth daily. 90 tablet 3  . buPROPion (WELLBUTRIN XL) 150 MG 24 hr tablet Take 1 tablet (150 mg total) by mouth daily. 1 tablet daily for a month then 1 tab twice a day 30 tablet 5  .  doxepin (SINEQUAN) 50 MG capsule Take 50 mg by mouth at bedtime.  0  . EPINEPHrine 0.3 mg/0.3 mL IJ SOAJ injection Inject into the muscle.    . ipratropium (ATROVENT) 0.06 % nasal spray Place 2 sprays into both nostrils 4 (four) times daily. 15 mL 1  . irbesartan (AVAPRO) 300 MG tablet Take 1 tablet (300 mg total) by mouth daily. 30 tablet 5  . meloxicam (MOBIC) 15 MG tablet One tab PO qAM with breakfast for 2 weeks, then daily prn pain. 30 tablet 3  . metFORMIN (GLUCOPHAGE) 500 MG tablet Take 1 tablet (500 mg total) by mouth daily with breakfast. 30 tablet 3  . prazosin (MINIPRESS) 1 MG capsule   0  . sildenafil (REVATIO) 20 MG tablet Take 1-5 tablets (20-100 mg total) by mouth as needed. 30 tablet 2  . simvastatin (ZOCOR) 10 MG tablet Take 1 tablet (10 mg total) by mouth daily. 90 tablet 1  . Vilazodone HCl (VIIBRYD) 40 MG TABS Take 1 tablet (40 mg total) by mouth daily. 30 tablet 3   No current facility-administered medications  for this visit.    No Known Allergies     Objective:  BP (!) 146/83   Pulse 72   Wt (!) 328 lb (148.8 kg)   BMI 49.87 kg/m  Gen:  alert, not ill-appearing, no distress, appropriate for age, obese male HEENT: head normocephalic without obvious abnormality, conjunctiva and cornea clear, trachea midline Pulm: Normal work of breathing, normal phonation, clear to auscultation bilaterally CV: Normal rate, regular rhythm, s1 and s2 distinct, no murmurs, clicks or rubs  Neuro: alert and oriented x 3, no tremor MSK: extremities atraumatic, normal gait and station, trace peripheral edema Skin: intact, no rashes on exposed skin, no jaundice, no cyanosis   No results found for this or any previous visit (from the past 72 hour(s)). No results found.    Assessment and Plan: 47 y.o. male with   1. Encounter for medication monitoring - CBC - Testosterone - Comprehensive metabolic panel - PSA - Hemoglobin A1c  2. Essential hypertension BP Readings from Last 3 Encounters:  06/02/17 (!) 146/83  04/15/17 (!) 179/102  02/07/17 138/84  - increasing Irbesartan to 300 mg daily - baby asa for primary prevention - counseled on therapeutic lifestyle changes   3. Hypogonadism male - CBC - Testosterone - PSA  4. Prediabetes - Hemoglobin A1c  5. Elevated hematocrit - CBC  6. OSA (obstructive sleep apnea)   7. Vasectomy evaluation - Ambulatory referral to Urology  8. Vasculogenic erectile dysfunction, unspecified vasculogenic erectile dysfunction type - SHIM score 10, moderate ED - sildenafil (REVATIO) 20 MG tablet; Take 1-5 tablets (20-100 mg total) by mouth as needed.  Dispense: 30 tablet; Refill: 2  9. Encounter for monitoring statin therapy - Lipid Panel w/reflex Direct LDL  10. Controlled type 2 diabetes mellitus without complication, without long-term current use of insulin ([place]) Lab Results  Component Value Date   HGBA1C 6.5 (H) 06/02/2017  - prediabetes has  progressed to Type 2 diabetes - starting Metformin - referring to diabetes educator - will need follow-up appointment in 4 weeks to discuss diabetic preventive care - metFORMIN (GLUCOPHAGE) 500 MG tablet; Take 1 tablet (500 mg total) by mouth daily with breakfast.  Dispense: 30 tablet; Refill: 3   Patient education and anticipatory guidance given Patient agrees with treatment plan Follow-up as needed if symptoms worsen or fail to improve  Darlyne Russian PA-C

## 2017-06-03 LAB — COMPREHENSIVE METABOLIC PANEL
AG RATIO: 1.2 (calc) (ref 1.0–2.5)
ALBUMIN MSPROF: 4.1 g/dL (ref 3.6–5.1)
ALT: 34 U/L (ref 9–46)
AST: 17 U/L (ref 10–40)
Alkaline phosphatase (APISO): 80 U/L (ref 40–115)
BILIRUBIN TOTAL: 0.4 mg/dL (ref 0.2–1.2)
BUN: 15 mg/dL (ref 7–25)
CALCIUM: 9.7 mg/dL (ref 8.6–10.3)
CHLORIDE: 105 mmol/L (ref 98–110)
CO2: 28 mmol/L (ref 20–32)
Creat: 0.95 mg/dL (ref 0.60–1.35)
GLOBULIN: 3.5 g/dL (ref 1.9–3.7)
Glucose, Bld: 111 mg/dL — ABNORMAL HIGH (ref 65–99)
POTASSIUM: 4.1 mmol/L (ref 3.5–5.3)
Sodium: 141 mmol/L (ref 135–146)
Total Protein: 7.6 g/dL (ref 6.1–8.1)

## 2017-06-03 LAB — CBC
HEMATOCRIT: 48 % (ref 38.5–50.0)
Hemoglobin: 15.7 g/dL (ref 13.2–17.1)
MCH: 25.7 pg — ABNORMAL LOW (ref 27.0–33.0)
MCHC: 32.7 g/dL (ref 32.0–36.0)
MCV: 78.6 fL — ABNORMAL LOW (ref 80.0–100.0)
MPV: 10.5 fL (ref 7.5–12.5)
Platelets: 300 10*3/uL (ref 140–400)
RBC: 6.11 10*6/uL — ABNORMAL HIGH (ref 4.20–5.80)
RDW: 17.4 % — AB (ref 11.0–15.0)
WBC: 6.1 10*3/uL (ref 3.8–10.8)

## 2017-06-03 LAB — HEMOGLOBIN A1C
EAG (MMOL/L): 7.7 (calc)
HEMOGLOBIN A1C: 6.5 %{Hb} — AB (ref <5.7)
Mean Plasma Glucose: 140 (calc)

## 2017-06-03 LAB — PSA: PSA: 2.2 ng/mL (ref <=4.0)

## 2017-06-03 LAB — TESTOSTERONE: Testosterone: 346 ng/dL (ref 250–827)

## 2017-06-03 MED ORDER — METFORMIN HCL 500 MG PO TABS: 500.0000 mg | ORAL_TABLET | Freq: Every day | ORAL | 3 refills | Status: DC

## 2017-06-03 NOTE — Progress Notes (Signed)
Hematocrit is okay. However, PSA has increased quite a bit. It is still in a normal range, but given the large increase and family hx of prostate cancer, I want him to follow-up with Urology about this. Since testosterone does increase the relative risk of prostate cancer, I am going to stop his testosterone for now. We can resume it once Urology gives the okay.  Also, A1c level has increased and is in the diabetic range. We are going to need to start Metformin and recheck him in 3 months. I am also going to refer him to the diabetes educator.  Please return in 3 months for diabetes follow-up

## 2017-06-09 ENCOUNTER — Telehealth: Payer: Self-pay | Admitting: Physician Assistant

## 2017-06-09 ENCOUNTER — Encounter: Payer: Self-pay | Admitting: Physician Assistant

## 2017-06-09 NOTE — Telephone Encounter (Signed)
Pt advised of results, verbalized understanding. Referrals have already been placed, nothing further needed. He will start the metformin. No further questions.

## 2017-06-15 ENCOUNTER — Encounter: Payer: Self-pay | Admitting: Physician Assistant

## 2017-06-15 DIAGNOSIS — E119 Type 2 diabetes mellitus without complications: Secondary | ICD-10-CM | POA: Insufficient documentation

## 2017-06-15 DIAGNOSIS — E66813 Obesity, class 3: Secondary | ICD-10-CM | POA: Insufficient documentation

## 2017-07-01 DIAGNOSIS — Z3009 Encounter for other general counseling and advice on contraception: Secondary | ICD-10-CM | POA: Diagnosis not present

## 2017-07-11 ENCOUNTER — Ambulatory Visit: Payer: Self-pay | Admitting: Registered"

## 2017-07-14 ENCOUNTER — Encounter: Payer: BLUE CROSS/BLUE SHIELD | Attending: Physician Assistant | Admitting: Registered"

## 2017-07-14 ENCOUNTER — Encounter: Payer: Self-pay | Admitting: Registered"

## 2017-07-14 DIAGNOSIS — E119 Type 2 diabetes mellitus without complications: Secondary | ICD-10-CM | POA: Diagnosis not present

## 2017-07-14 DIAGNOSIS — Z713 Dietary counseling and surveillance: Secondary | ICD-10-CM | POA: Insufficient documentation

## 2017-07-14 NOTE — Progress Notes (Signed)
Diabetes Self-Management Education  Visit Type: First/Initial  Appt. Start Time: 1410 Appt. End Time: 1517  07/14/2017  Mr. Christopher [name], identified by name and date of birth, is a 47 y.o. male with a diagnosis of Diabetes: Type 2.   ASSESSMENT Patient states when he was told his blood sugar was getting in the diabetes range he immediately cut out soda, sweet tea, and sweets.  Patient states he has lost appetite at lunch time for about last week, and thinks it may be due to metformin.    Patient works rotating 12-hr shifts and gets about 6-8 hrs sleep which patient states he feels is enough. Patient states he regularly wears a CPAP and notices a difference when he goes a couple of nights without it.  Patient states his girlfriend is very supportive and they also work to have healthy habits in the home for their children.  Diabetes Self-Management Education - 07/14/17 1412      Visit Information   Visit Type  First/Initial      Initial Visit   Diabetes Type  Type 2    Are you currently following a meal plan?  No    Are you taking your medications as prescribed?  Yes metformin      Psychosocial Assessment   What is the last grade level you completed in school?  college      Complications   Last HgB A1C per patient/outside source  6.5 %    How often do you check your blood sugar?  0 times/day (not testing)    Have you had a dilated eye exam in the past 12 months?  Yes    Have you had a dental exam in the past 12 months?  Yes    Are you checking your feet?  No      Dietary Intake   Breakfast  fruit, oatmeal OR omlette OR fruit smoothie    Snack (morning)  none    Lunch  none OR nuts    Snack (afternoon)  none OR cheese stick OR pickels     Dinner  salad, meatballs OR [place] green beans, baked chicken salad, squash    Snack (evening)  none    Beverage(s)  water, unsweet tea, gatorade zero      Exercise   Exercise Type  Light (walking / raking leaves)    How many  days per week to you exercise?  3    How many minutes per day do you exercise?  40    Total minutes per week of exercise  120      Patient Education   Previous Diabetes Education  No    Disease state   Definition of diabetes, type 1 and 2, and the diagnosis of diabetes    Nutrition management   Role of diet in the treatment of diabetes and the relationship between the three main macronutrients and blood glucose level;Food label reading, portion sizes and measuring food.    Physical activity and exercise   Role of exercise on diabetes management, blood pressure control and cardiac health.    Medications  Reviewed patients medication for diabetes, action, purpose, timing of dose and side effects.    Monitoring  Identified appropriate SMBG and/or A1C goals.    Chronic complications  Relationship between chronic complications and blood glucose control    Psychosocial adjustment  Role of stress on diabetes      Individualized Goals (developed by patient)   Nutrition  General guidelines  for healthy choices and portions discussed    Physical Activity  Exercise 3-5 times per week      Outcomes   Expected Outcomes  Demonstrated interest in learning. Expect positive outcomes    Future DMSE  PRN    Program Status  Completed     Individualized Plan for Diabetes Self-Management Training:   Learning Objective:  Patient will have a greater understanding of diabetes self-management. Patient education plan is to attend individual and/or group sessions per assessed needs and concerns.   Patient Instructions  Consider using fairlife milk in your smoothies to get more protein. Consider adding protein to your oatmeal/fruit breakfast Consider using humus as a dip with your vegetables Aim to eat balanced meals and snacks and include protein whenever you have carbohydrates.  Consider having no more than 80 grams carbs at meals, 30 grams for snacks. American Diabetes Website link to book Living Well With  Diabetes: http://diabetes.ada-ksw.com   Expected Outcomes:  Demonstrated interest in learning. Expect positive outcomes  Education material provided: A1C conversion sheet, Snack sheet and Carbohydrate counting sheet  If problems or questions, patient to contact team via:  Phone  Future DSME appointment: PRN

## 2017-07-14 NOTE — Patient Instructions (Signed)
Consider using fairlife milk in your smoothies to get more protein. Consider adding protein to your oatmeal/fruit breakfast Consider using humus as a dip with your vegetables Aim to eat balanced meals and snacks and include protein whenever you have carbohydrates.  Consider having no more than 80 grams carbs at meals, 30 grams for snacks. American Diabetes Website link to book Living Well With Diabetes: http://diabetes.ada-ksw.com

## 2017-08-04 HISTORY — PX: VASECTOMY: SHX75

## 2017-08-08 DIAGNOSIS — Z302 Encounter for sterilization: Secondary | ICD-10-CM | POA: Diagnosis not present

## 2017-09-05 ENCOUNTER — Emergency Department (INDEPENDENT_AMBULATORY_CARE_PROVIDER_SITE_OTHER): Payer: BLUE CROSS/BLUE SHIELD

## 2017-09-05 ENCOUNTER — Encounter: Payer: Self-pay | Admitting: Emergency Medicine

## 2017-09-05 ENCOUNTER — Emergency Department
Admission: EM | Admit: 2017-09-05 | Discharge: 2017-09-05 | Disposition: A | Discharge status: Home / Self Care | Payer: BLUE CROSS/BLUE SHIELD | Source: Home / Self Care | Attending: Family Medicine | Admitting: Family Medicine

## 2017-09-05 ENCOUNTER — Other Ambulatory Visit: Payer: Self-pay

## 2017-09-05 DIAGNOSIS — M79671 Pain in right foot: Secondary | ICD-10-CM

## 2017-09-05 DIAGNOSIS — S93601A Unspecified sprain of right foot, initial encounter: Secondary | ICD-10-CM

## 2017-09-05 NOTE — ED Triage Notes (Signed)
Patient reports falling over roller blades left on steps yesterday; able to walk but top of foot is slightly edematous and sore. No OTC today.

## 2017-09-05 NOTE — Discharge Instructions (Addendum)
Apply ice pack for 30 minutes every 1 to 2 hours today and tomorrow.  Elevate. Wear Ace wrap until swelling decreases.  Begin range of motion and stretching exercises in about 5 days as per instruction sheet.  May take Ibuprofen 200mg , 4 tabs every 8 hours with food.

## 2017-09-05 NOTE — ED Provider Notes (Signed)
[name]    CSN: [id number] Arrival date & time: 09/05/17  2202     History   Chief Complaint Chief Complaint  Patient presents with  . Foot Pain    right    HPI Christopher [name] is a 47 y.o. male.   Patient tripped over roller blades yesterday at about 6pm, injuring his right foot.  He has had persistent pain/swelling in the dorsum of his right foot.  The history is provided by the patient.  Foot Pain  This is a new problem. The current episode started yesterday. The problem occurs constantly. The problem has not changed since onset.The symptoms are aggravated by walking and standing. Nothing relieves the symptoms. Treatments tried: ice pack and ibuprofen. The treatment provided mild relief.    Past Medical History:  Diagnosis Date  . Depression   . Hyperlipidemia   . Hypogonadal obesity   . Lumbar degenerative disc disease   . Obesity   . OSA (obstructive sleep apnea) 12/02/2013   CPAP therapy on 17 cm H2O with a Wide size Resmed Nasal Mask Mirage FX mask and heated humidification   . Post traumatic stress disorder (PTSD)   . Prediabetes 2018    Patient Active Problem List   Diagnosis Date Noted  . Class 3 severe obesity due to excess calories with serious comorbidity in adult ([place]) 06/15/2017  . Controlled type 2 diabetes mellitus without complication, without long-term current use of insulin ([place]) 06/15/2017  . Encounter for medication monitoring 06/02/2017  . 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 Essential hypertension 01/10/2017  . Elevated hematocrit 11/24/2016  . Elevated blood pressure reading 08/11/2016  . Hypogonadism in male 06/01/2016  . Abnormal prostate specific antigen (PSA) 06/01/2016  . Moderate episode of recurrent major depressive disorder ([place]) 05/30/2016  . Chronic post-traumatic stress disorder (PTSD) 05/30/2016  . Nightmares associated with chronic post-traumatic stress disorder 05/30/2016  . Lumbar degenerative disc  disease 02/05/2016  . Insomnia 12/22/2015  . OSA (obstructive sleep apnea) 12/02/2013  . Severe obesity (BMI >= 40) ([place]) 12/02/2013  . Family history of prostate cancer 12/02/2013  . Hyperlipidemia     History reviewed. No pertinent surgical history.     Home Medications    Prior to Admission medications   Medication Sig Start Date End Date Taking? Authorizing Provider  AMBULATORY NON FORMULARY MEDICATION CPAP therapy on 17 cm H2O with a Wide size Resmed Nasal Mask Mirage FX mask and heated humidification. Use nightly for sleep.  Dx: Obstructive Sleep Apnea 02/09/15   Marcial Pacas, DO  aspirin EC 81 MG tablet Take 1 tablet (81 mg total) by mouth daily. 11/24/16   Trixie Dredge, PA-C  buPROPion (WELLBUTRIN XL) 150 MG 24 hr tablet Take 1 tablet (150 mg total) by mouth daily. 1 tablet daily for a month then 1 tab twice a day 08/11/16   Trixie Dredge, PA-C  doxepin (SINEQUAN) 50 MG capsule Take 50 mg by mouth at bedtime. 06/18/16   [provider]  EPINEPHrine 0.3 mg/0.3 mL IJ SOAJ injection Inject into the muscle.  11/25/13   [provider]  ipratropium (ATROVENT) 0.06 % nasal spray Place 2 sprays into both nostrils 4 (four) times daily. Patient not taking: Reported on 07/14/2017 04/15/17   Noe Gens, PA-C  irbesartan (AVAPRO) 300 MG tablet Take 1 tablet (300 mg total) by mouth daily. 06/02/17   Trixie Dredge, PA-C  meloxicam (MOBIC) 15 MG tablet One tab PO qAM with breakfast for  2 weeks, then daily prn pain. 02/05/16   Silverio Decamp, MD  metFORMIN (GLUCOPHAGE) 500 MG tablet Take 1 tablet (500 mg total) by mouth daily with breakfast. 06/03/17   Trixie Dredge, PA-C  prazosin (MINIPRESS) 1 MG capsule  05/17/16   [provider]  sildenafil (REVATIO) 20 MG tablet Take 1-5 tablets (20-100 mg total) by mouth as needed. 06/02/17   Trixie Dredge, PA-C  simvastatin (ZOCOR) 10 MG tablet Take 1 tablet  (10 mg total) by mouth daily. 12/26/16   Trixie Dredge, PA-C  Vilazodone HCl (VIIBRYD) 40 MG TABS Take 1 tablet (40 mg total) by mouth daily. 12/22/15   Marcial Pacas, DO    Family History Family History  Problem Relation Age of Onset  . Prostate cancer Unknown     Social History Social History   Tobacco Use  . Smoking status: Never Smoker  . Smokeless tobacco: Never Used  Substance Use Topics  . Alcohol use: Yes  . Drug use: No     Allergies   Patient has no known allergies.   Review of Systems Review of Systems  All other systems reviewed and are negative.    Physical Exam Triage Vital Signs ED Triage Vitals  Enc Vitals Group     BP 09/05/17 0858 132/79     Pulse Rate 09/05/17 0858 82     Resp 09/05/17 0858 18     Temp 09/05/17 0858 98.5 F (36.9 C)     Temp Source 09/05/17 0858 Oral     SpO2 09/05/17 0858 96 %     Weight 09/05/17 0900 (!) 310 lb (140.6 kg)     Height 09/05/17 0900 5\' 8"  (1.727 m)     Head Circumference --      Peak Flow --      Pain Score 09/05/17 0859 7     Pain Loc --      Pain Edu? --      Excl. in [place]? --    No data found.  Updated Vital Signs BP 132/79 (BP Location: Right Arm)   Pulse 82   Temp 98.5 F (36.9 C) (Oral)   Resp 18   Ht 5\' 8"  (1.727 m)   Wt (!) 310 lb (140.6 kg)   SpO2 96%   BMI 47.14 kg/m   Visual Acuity Right Eye Distance:   Left Eye Distance:   Bilateral Distance:    Right Eye Near:   Left Eye Near:    Bilateral Near:     Physical Exam  Constitutional: He appears well-nourished. No distress.  HENT:  Head: Atraumatic.  Eyes: Pupils are equal, round, and reactive to light.  Neck: Normal range of motion.  Cardiovascular: Normal rate.  Pulmonary/Chest: Effort normal.  Musculoskeletal:       Right foot: There is tenderness and swelling. There is normal range of motion, no bony tenderness, normal capillary refill, no crepitus, no deformity and no laceration.       Feet:  Mild swelling  and tenderness to palpation dorsum of right foot as noted on diagram.  Right ankle has full range of motion without tenderness.  Neurological: He is alert.  Skin: Skin is warm and dry.  Nursing note and vitals reviewed.    UC Treatments / Results  Labs (all labs ordered are listed, but only abnormal results are displayed) Labs Reviewed - No data to display  EKG None  Radiology Dg Foot Complete Right  Result Date: 09/05/2017 CLINICAL DATA:  Slipped yesterday twisting the foot outward with sharp pain over the dorsum of EXAM: RIGHT FOOT COMPLETE - 3+ VIEW COMPARISON:  Right foot films of 03/08/2014 FINDINGS: Tarsal-metatarsal alignment is normal. No fracture is seen. No dislocation is noted. Joint spaces appear normal. IMPRESSION: Negative. Electronically Signed   By: Ivar Drape M.D.   On: 09/05/2017 09:39    Procedures Procedures (including critical care time)  Medications Ordered in UC Medications - No data to display  Initial Impression / Assessment and Plan / UC Course  I have reviewed the triage vital signs and the nursing notes.  Pertinent labs & imaging results that were available during my care of the patient were reviewed by me and considered in my medical decision making (see chart for details).    Ace wrap applied. .Followup with Dr. Aundria Mems or Dr. Lynne Leader ([facility]) if not improving about two weeks.   Final Clinical Impressions(s) / UC Diagnoses   Final diagnoses:  Right foot sprain, initial encounter     Discharge Instructions     Apply ice pack for 30 minutes every 1 to 2 hours today and tomorrow.  Elevate. Wear Ace wrap until swelling decreases.  Begin range of motion and stretching exercises in about 5 days as per instruction sheet.  May take Ibuprofen 200mg , 4 tabs every 8 hours with food.     ED Prescriptions    None         Kandra Nicolas, MD 09/05/17 1029

## 2017-09-09 DIAGNOSIS — F4323 Adjustment disorder with mixed anxiety and depressed mood: Secondary | ICD-10-CM | POA: Diagnosis not present

## 2017-09-12 ENCOUNTER — Ambulatory Visit: Payer: BLUE CROSS/BLUE SHIELD | Admitting: Physician Assistant

## 2017-09-12 ENCOUNTER — Encounter: Payer: Self-pay | Admitting: Physician Assistant

## 2017-09-12 VITALS — BP 138/90 | HR 82 | Wt 305.0 lb

## 2017-09-12 DIAGNOSIS — I10 Essential (primary) hypertension: Secondary | ICD-10-CM | POA: Diagnosis not present

## 2017-09-12 DIAGNOSIS — E119 Type 2 diabetes mellitus without complications: Secondary | ICD-10-CM

## 2017-09-12 DIAGNOSIS — Z79899 Other long term (current) drug therapy: Secondary | ICD-10-CM | POA: Diagnosis not present

## 2017-09-12 DIAGNOSIS — Z6841 Body Mass Index (BMI) 40.0 and over, adult: Secondary | ICD-10-CM | POA: Diagnosis not present

## 2017-09-12 DIAGNOSIS — F419 Anxiety disorder, unspecified: Secondary | ICD-10-CM | POA: Diagnosis not present

## 2017-09-12 DIAGNOSIS — I152 Hypertension secondary to endocrine disorders: Secondary | ICD-10-CM

## 2017-09-12 DIAGNOSIS — E1159 Type 2 diabetes mellitus with other circulatory complications: Secondary | ICD-10-CM

## 2017-09-12 DIAGNOSIS — F5105 Insomnia due to other mental disorder: Secondary | ICD-10-CM | POA: Diagnosis not present

## 2017-09-12 DIAGNOSIS — R609 Edema, unspecified: Secondary | ICD-10-CM

## 2017-09-12 MED ORDER — METFORMIN HCL 500 MG PO TABS: 500.0000 mg | ORAL_TABLET | Freq: Every day | ORAL | 3 refills | Status: DC

## 2017-09-12 MED ORDER — VALSARTAN-HYDROCHLOROTHIAZIDE 320-12.5 MG PO TABS: 1.0000 | ORAL_TABLET | Freq: Every day | ORAL | 1 refills | Status: DC

## 2017-09-12 MED ORDER — HYDROXYZINE HCL 25 MG PO TABS
12.5000 mg | ORAL_TABLET | Freq: Every evening | ORAL | 2 refills | Status: DC | PRN
Start: 2017-09-12 — End: 2018-05-13

## 2017-09-12 MED ORDER — VALSARTAN 320 MG PO TABS
320.0000 mg | ORAL_TABLET | Freq: Every day | ORAL | 1 refills | Status: DC
Start: 2017-09-12 — End: 2017-09-12

## 2017-09-12 NOTE — Progress Notes (Signed)
HPI:                                                                Christopher [name] is a 47 y.o. male who presents to [facility] today for medication management  DMII: newly diagnosed in January (A1c=6.5). Taking low-dose Metformin once daily. Compliant with medications. He also followed up with the diabetic educator in March. Does not check blood sugars at home. Denies hypoglycemic events. Denies polydipsia, polyuria, polyphagia. Denies blurred vision or vision change. Denies extremity pain, altered sensation and paresthesias.  Denies ulcers/wounds on feet. Eye exam: due Foot exam: due Diet: ADA  HTN: taking Irbesartan 300 mg daily. Compliant with medications. Does not check BP's at home. Denies vision change, headache, chest pain with exertion, orthopnea, lightheadedness, syncope and edema. Risk factors include: DM2, obesity, male sex,    Feels like Prazosin is causing him to feel dizzy and "drunk." He is followed by the [facility] for PTSD, but is currently searching for a new therapist.  He followed up with Urology for his elevated PSA. He was cleared, but has decided not to continue testosterone replacement. Feels his energy levels and mood are unchanged without it.   Past Medical History:  Diagnosis Date  . Depression   . Hyperlipidemia   . Hypogonadal obesity   . Lumbar degenerative disc disease   . Obesity   . OSA (obstructive sleep apnea) 12/02/2013   CPAP therapy on 17 cm H2O with a Wide size Resmed Nasal Mask Mirage FX mask and heated humidification   . Post traumatic stress disorder (PTSD)   . Prediabetes 2018   History reviewed. No pertinent surgical history. Social History   Tobacco Use  . Smoking status: Never Smoker  . Smokeless tobacco: Never Used  Substance Use Topics  . Alcohol use: Yes   family history includes Prostate cancer in his unknown relative.    ROS: negative except as noted in the  HPI  Medications: Current Outpatient Medications  Medication Sig Dispense Refill  . AMBULATORY NON FORMULARY MEDICATION CPAP therapy on 17 cm H2O with a Wide size Resmed Nasal Mask Mirage FX mask and heated humidification. Use nightly for sleep.  Dx: Obstructive Sleep Apnea 1 Units 0  . aspirin EC 81 MG tablet Take 1 tablet (81 mg total) by mouth daily. 90 tablet 3  . buPROPion (WELLBUTRIN XL) 150 MG 24 hr tablet Take 1 tablet (150 mg total) by mouth daily. 1 tablet daily for a month then 1 tab twice a day 30 tablet 5  . doxepin (SINEQUAN) 50 MG capsule Take 50 mg by mouth at bedtime.  0  . EPINEPHrine 0.3 mg/0.3 mL IJ SOAJ injection Inject into the muscle.     . meloxicam (MOBIC) 15 MG tablet One tab PO qAM with breakfast for 2 weeks, then daily prn pain. 30 tablet 3  . metFORMIN (GLUCOPHAGE) 500 MG tablet Take 1 tablet (500 mg total) by mouth daily with breakfast. 30 tablet 3  . sildenafil (REVATIO) 20 MG tablet Take 1-5 tablets (20-100 mg total) by mouth as needed. 30 tablet 2  . simvastatin (ZOCOR) 10 MG tablet Take 1 tablet (10 mg total) by mouth daily. 90 tablet 1  . Vilazodone HCl (VIIBRYD)  40 MG TABS Take 1 tablet (40 mg total) by mouth daily. 30 tablet 3  . hydrOXYzine (ATARAX/VISTARIL) 25 MG tablet Take 0.5-1 tablets (12.5-25 mg total) by mouth at bedtime as needed for anxiety (insomnia). 30 tablet 2  . valsartan-hydrochlorothiazide (DIOVAN-HCT) 320-12.5 MG tablet Take 1 tablet by mouth daily. 90 tablet 1   No current facility-administered medications for this visit.    No Known Allergies     Objective:  BP 138/90   Pulse 82   Wt (!) 305 lb (138.3 kg)   BMI 46.38 kg/m  Gen:  alert, not ill-appearing, no distress, appropriate for age, obese male HEENT: head normocephalic without obvious abnormality, conjunctiva and cornea clear, trachea midline Pulm: Normal work of breathing, normal phonation, clear to auscultation bilaterally, no wheezes, rales or rhonchi CV: Normal rate,  regular rhythm, s1 and s2 distinct, no murmurs, clicks or rubs  Neuro: alert and oriented x 3, no tremor MSK: extremities atraumatic, normal gait and station, 2+ peripheral edema Skin: intact, no rashes on exposed skin, no jaundice, no cyanosis Psych: well-groomed, cooperative, good eye contact, euthymic mood, affect mood-congruent, speech is articulate, and thought processes clear and goal-directed    No results found for this or any previous visit (from the past 72 hour(s)). No results found.    Assessment and Plan: 47 y.o. male with   Encounter for medication management  Class 3 severe obesity due to excess calories with serious comorbidity and body mass index (BMI) of 45.0 to 49.9 in adult [facility])  Controlled type 2 diabetes mellitus without complication, without long-term current use of insulin ([place]) - Plan: Hemoglobin A1c, metFORMIN (GLUCOPHAGE) 500 MG tablet, DISCONTINUED: valsartan (DIOVAN) 320 MG tablet  Insomnia secondary to anxiety - Plan: hydrOXYzine (ATARAX/VISTARIL) 25 MG tablet  Hypertension associated with diabetes ([place]) - Plan: valsartan-hydrochlorothiazide (DIOVAN-HCT) 320-12.5 MG tablet  Peripheral edema  Type 2 diabetes Lab Results  Component Value Date   HGBA1C 5.8 (H) 09/12/2017  - well-controlled with lifestyle measures and low-dose Metformin - cont ARB - cont statin - foot exam should have been performed by nursing staff today. Will defer until next follow-up - counseled on diabetes preventive care including annual eye exam - ADA diet. Follow-up with diabetes educator prn  HTN - BP mildly out of range today with peripheral edema on exam. I am going to switch to Valsartan-HCTZ - cont baby asa for primary prevention - counseled on therapeutic lifestyle changes  Insomnia due to anxiety/Chronic PTSD - patient will d/c Prazosin due to adverse effects - cont Doxepin - Hydroxyzine prn for breakthrough sleep difficulties - [facility]  Obesity - counseled on weight loss through decreased caloric intake and increased aerobic exercise - follow nutrition recommendations of RD, 80g carb per meal, 30g per snack, ADA diet  Patient education and anticipatory guidance given Patient agrees with treatment plan Follow-up in 6 months for DM2/HTN or sooner as needed if symptoms worsen or fail to improve  Darlyne Russian PA-C

## 2017-09-12 NOTE — Patient Instructions (Signed)
Diabetes Preventive Care: - annual foot exam  - annual dilated eye exam with an eye doctor - self foot exams at least weekly - twice yearly dental cleanings and yearly exam - goal blood pressure <140/90, ideally <130/80 - LDL cholesterol <70 - A1C <7.0 - body mass index (BMI) <25.0 - follow-up every 3 months if your A1C is not at goal - follow-up every 6 months if diabetes is well controlled  For your blood pressure: - Goal <130/80 - start Valsartan-HCTZ combo pill every morning - monitor and log blood pressures at home - check around the same time each day in a relaxed setting - Limit salt to <2000 mg/day - Follow DASH eating plan - limit alcohol to 2 standard drinks per day for men and 1 per day for women - avoid tobacco products - weight loss: 7% of current body weight - follow-up every 6 months for your blood pressure

## 2017-09-13 LAB — HEMOGLOBIN A1C
Hgb A1c MFr Bld: 5.8 % of total Hgb — ABNORMAL HIGH (ref <5.7)
Mean Plasma Glucose: 120 (calc)
eAG (mmol/L): 6.6 (calc)

## 2017-09-14 NOTE — Progress Notes (Signed)
A1c is great Continue Metformin once daily with a meal

## 2017-09-15 ENCOUNTER — Ambulatory Visit: Payer: BLUE CROSS/BLUE SHIELD | Admitting: Physician Assistant

## 2017-09-16 ENCOUNTER — Ambulatory Visit (INDEPENDENT_AMBULATORY_CARE_PROVIDER_SITE_OTHER): Payer: BLUE CROSS/BLUE SHIELD | Admitting: Physician Assistant

## 2017-09-16 ENCOUNTER — Encounter: Payer: Self-pay | Admitting: Physician Assistant

## 2017-09-16 VITALS — BP 128/74 | HR 86

## 2017-09-16 DIAGNOSIS — R609 Edema, unspecified: Secondary | ICD-10-CM | POA: Insufficient documentation

## 2017-09-16 DIAGNOSIS — Z23 Encounter for immunization: Secondary | ICD-10-CM

## 2017-09-16 NOTE — Progress Notes (Signed)
Pt came into clinic today for pneumonia vaccine.   Vitals:   09/16/17 1535  BP: 128/74  Pulse: 86     Per verbal per PCP, Pneumovax 23 was administered. Pt tolerated injection in left deltoid well, no immediate complications. Advised to contact office with any questions. Verbalized understanding.

## 2017-09-19 DIAGNOSIS — F4323 Adjustment disorder with mixed anxiety and depressed mood: Secondary | ICD-10-CM | POA: Diagnosis not present

## 2017-09-23 DIAGNOSIS — F4323 Adjustment disorder with mixed anxiety and depressed mood: Secondary | ICD-10-CM | POA: Diagnosis not present

## 2017-10-06 DIAGNOSIS — F4323 Adjustment disorder with mixed anxiety and depressed mood: Secondary | ICD-10-CM | POA: Diagnosis not present

## 2017-10-20 DIAGNOSIS — F4323 Adjustment disorder with mixed anxiety and depressed mood: Secondary | ICD-10-CM | POA: Diagnosis not present

## 2017-11-04 DIAGNOSIS — F3162 Bipolar disorder, current episode mixed, moderate: Secondary | ICD-10-CM | POA: Diagnosis not present

## 2017-11-04 DIAGNOSIS — F4312 Post-traumatic stress disorder, chronic: Secondary | ICD-10-CM | POA: Diagnosis not present

## 2017-11-20 DIAGNOSIS — E119 Type 2 diabetes mellitus without complications: Secondary | ICD-10-CM | POA: Diagnosis not present

## 2017-11-20 DIAGNOSIS — H40023 Open angle with borderline findings, high risk, bilateral: Secondary | ICD-10-CM | POA: Diagnosis not present

## 2017-11-20 DIAGNOSIS — H524 Presbyopia: Secondary | ICD-10-CM | POA: Diagnosis not present

## 2017-11-20 DIAGNOSIS — H5212 Myopia, left eye: Secondary | ICD-10-CM | POA: Diagnosis not present

## 2017-12-12 DIAGNOSIS — G4733 Obstructive sleep apnea (adult) (pediatric): Secondary | ICD-10-CM | POA: Diagnosis not present

## 2018-01-09 DIAGNOSIS — H40023 Open angle with borderline findings, high risk, bilateral: Secondary | ICD-10-CM | POA: Diagnosis not present

## 2018-01-09 LAB — HM DIABETES EYE EXAM

## 2018-01-12 DIAGNOSIS — F3162 Bipolar disorder, current episode mixed, moderate: Secondary | ICD-10-CM | POA: Diagnosis not present

## 2018-01-12 DIAGNOSIS — F4312 Post-traumatic stress disorder, chronic: Secondary | ICD-10-CM | POA: Diagnosis not present

## 2018-01-16 ENCOUNTER — Encounter: Payer: Self-pay | Admitting: Physician Assistant

## 2018-01-16 ENCOUNTER — Ambulatory Visit (INDEPENDENT_AMBULATORY_CARE_PROVIDER_SITE_OTHER): Payer: BLUE CROSS/BLUE SHIELD | Admitting: Physician Assistant

## 2018-01-16 VITALS — BP 117/76 | HR 80 | Wt 286.0 lb

## 2018-01-16 DIAGNOSIS — E1169 Type 2 diabetes mellitus with other specified complication: Secondary | ICD-10-CM

## 2018-01-16 DIAGNOSIS — E119 Type 2 diabetes mellitus without complications: Secondary | ICD-10-CM | POA: Diagnosis not present

## 2018-01-16 DIAGNOSIS — Z79899 Other long term (current) drug therapy: Secondary | ICD-10-CM

## 2018-01-16 DIAGNOSIS — I152 Hypertension secondary to endocrine disorders: Secondary | ICD-10-CM

## 2018-01-16 DIAGNOSIS — Z23 Encounter for immunization: Secondary | ICD-10-CM | POA: Diagnosis not present

## 2018-01-16 DIAGNOSIS — E1159 Type 2 diabetes mellitus with other circulatory complications: Secondary | ICD-10-CM | POA: Diagnosis not present

## 2018-01-16 DIAGNOSIS — I10 Essential (primary) hypertension: Secondary | ICD-10-CM

## 2018-01-16 DIAGNOSIS — E782 Mixed hyperlipidemia: Secondary | ICD-10-CM

## 2018-01-16 DIAGNOSIS — Z130 Encounter for screening for diseases of the blood and blood-forming organs and certain disorders involving the immune mechanism: Secondary | ICD-10-CM

## 2018-01-16 LAB — POCT GLYCOSYLATED HEMOGLOBIN (HGB A1C): HbA1c POC (<> result, manual entry): 5.6 % (ref 4.0–5.6)

## 2018-01-16 MED ORDER — SIMVASTATIN 10 MG PO TABS: 10.0000 mg | ORAL_TABLET | Freq: Every day | ORAL | 1 refills | Status: DC

## 2018-01-16 MED ORDER — METFORMIN HCL 500 MG PO TABS
500.0000 mg | ORAL_TABLET | Freq: Every day | ORAL | 1 refills | Status: DC
Start: 2018-01-16 — End: 2018-05-13

## 2018-01-16 NOTE — Patient Instructions (Addendum)
Diabetes Preventive Care: - annual foot exam  - annual dilated eye exam with an eye doctor - self foot exams at least weekly - pneumonia vaccine once (booster in 5 years and at age 47) - annual influenza vaccine - twice yearly dental cleanings and yearly exam - goal blood pressure <140/90, ideally <130/80 - LDL cholesterol <70 - A1C <7.0 - body mass index (BMI) <25.0 - follow-up every 3 months if your A1C is not at goal - follow-up every 6 months if diabetes is well controlled   Exercising to Lose Weight Exercising can help you to lose weight. In order to lose weight through exercise, you need to do vigorous-intensity exercise. You can tell that you are exercising with vigorous intensity if you are breathing very hard and fast and cannot hold a conversation while exercising. Moderate-intensity exercise helps to maintain your current weight. You can tell that you are exercising at a moderate level if you have a higher heart rate and faster breathing, but you are still able to hold a conversation. How often should I exercise? Choose an activity that you enjoy and set realistic goals. Your health care provider can help you to make an activity plan that works for you. Exercise regularly as directed by your health care provider. This may include:  Doing resistance training twice each week, such as: ? Push-ups. ? Sit-ups. ? Lifting weights. ? Using resistance bands.  Doing a given intensity of exercise for a given amount of time. Choose from these options: ? 150 minutes of moderate-intensity exercise every week. ? 75 minutes of vigorous-intensity exercise every week. ? A mix of moderate-intensity and vigorous-intensity exercise every week.  Children, pregnant women, people who are out of shape, people who are overweight, and older adults may need to consult a health care provider for individual recommendations. If you have any sort of medical condition, be sure to consult your health care  provider before starting a new exercise program. What are some activities that can help me to lose weight?  Walking at a rate of at least 4.5 miles an hour.  Jogging or running at a rate of 5 miles per hour.  Biking at a rate of at least 10 miles per hour.  Lap swimming.  Roller-skating or in-line skating.  Cross-country skiing.  Vigorous competitive sports, such as football, basketball, and soccer.  Jumping rope.  Aerobic dancing. How can I be more active in my day-to-day activities?  Use the stairs instead of the elevator.  Take a walk during your lunch break.  If you drive, park your car farther away from work or school.  If you take public transportation, get off one stop early and walk the rest of the way.  Make all of your phone calls while standing up and walking around.  Get up, stretch, and walk around every 30 minutes throughout the day. What guidelines should I follow while exercising?  Do not exercise so much that you hurt yourself, feel dizzy, or get very short of breath.  Consult your health care provider prior to starting a new exercise program.  Wear comfortable clothes and shoes with good support.  Drink plenty of water while you exercise to prevent dehydration or heat stroke. Body water is lost during exercise and must be replaced.  Work out until you breathe faster and your heart beats faster. This information is not intended to replace advice given to you by your health care provider. Make sure you discuss any questions you have  with your health care provider. Document Released: 05/25/2010 Document Revised: 09/28/2015 Document Reviewed: 09/23/2013 Elsevier Interactive Patient Education  [employer].

## 2018-01-16 NOTE — Progress Notes (Signed)
HPI:                                                                Christopher [name] is a 47 y.o. male who presents to [facility] today for diabetes / HTN follow-up   DMII: new diagnosis 9 months ago. Currently taking low-dose Metformin 500 mg daily. Compliant with medications.  Denies polydipsia, polyuria, polyphagia. Denies blurred vision or vision change. Denies extremity pain, altered sensation and paresthesias.  Denies ulcers/wounds on feet. He has cut out all junk food, sodas, candy, chips. He is walking at the gym 30 minutes 2-3 days per week. He has lost approx 20 pounds.  HTN: switched to Valsartan-HCTZ combination 3 months ago. Compliant with medications. Does not check BP's at home. Denies vision change, headache, chest pain with exertion, orthopnea, lightheadedness, syncope and edema. Risk factors include: DM2, obesity, male sex, HLD   Past Medical History:  Diagnosis Date  . Depression   . Hyperlipidemia   . Hypogonadal obesity   . Lumbar degenerative disc disease   . Obesity   . OSA (obstructive sleep apnea) 12/02/2013   CPAP therapy on 17 cm H2O with a Wide size Resmed Nasal Mask Mirage FX mask and heated humidification   . Post traumatic stress disorder (PTSD)   . Prediabetes 2018   Past Surgical History:  Procedure Laterality Date  . VASECTOMY  08/2017   Social History   Tobacco Use  . Smoking status: Never Smoker  . Smokeless tobacco: Never Used  Substance Use Topics  . Alcohol use: Yes   family history includes Prostate cancer in his unknown relative.    ROS: negative except as noted in the HPI  Medications: Current Outpatient Medications  Medication Sig Dispense Refill  . AMBULATORY NON FORMULARY MEDICATION CPAP therapy on 17 cm H2O with a Wide size Resmed Nasal Mask Mirage FX mask and heated humidification. Use nightly for sleep.  Dx: Obstructive Sleep Apnea 1 Units 0  . aspirin EC 81 MG tablet Take  1 tablet (81 mg total) by mouth daily. 90 tablet 3  . buPROPion (WELLBUTRIN XL) 150 MG 24 hr tablet Take 1 tablet (150 mg total) by mouth daily. 1 tablet daily for a month then 1 tab twice a day 30 tablet 5  . doxepin (SINEQUAN) 50 MG capsule Take 50 mg by mouth at bedtime.  0  . EPINEPHrine 0.3 mg/0.3 mL IJ SOAJ injection Inject into the muscle.     . hydrOXYzine (ATARAX/VISTARIL) 25 MG tablet Take 0.5-1 tablets (12.5-25 mg total) by mouth at bedtime as needed for anxiety (insomnia). 30 tablet 2  . meloxicam (MOBIC) 15 MG tablet One tab PO qAM with breakfast for 2 weeks, then daily prn pain. 30 tablet 3  . metFORMIN (GLUCOPHAGE) 500 MG tablet Take 1 tablet (500 mg total) by mouth daily with breakfast. 90 tablet 1  . sildenafil (REVATIO) 20 MG tablet Take 1-5 tablets (20-100 mg total) by mouth as needed. 30 tablet 2  . simvastatin (ZOCOR) 10 MG tablet Take 1 tablet (10 mg total) by mouth daily. 90 tablet 1  . valsartan-hydrochlorothiazide (DIOVAN-HCT) 320-12.5 MG tablet Take 1 tablet by mouth daily. 90 tablet 1  . Vilazodone HCl (VIIBRYD) 40 MG TABS  Take 1 tablet (40 mg total) by mouth daily. 30 tablet 3   No current facility-administered medications for this visit.    No Known Allergies     Objective:  BP 117/76   Pulse 80   Wt 286 lb (129.7 kg)   BMI 43.49 kg/m  Gen:  alert, not ill-appearing, no distress, appropriate for age, obese male HEENT: head normocephalic without obvious abnormality, conjunctiva and cornea clear, trachea midline Pulm: Normal work of breathing, normal phonation, clear to auscultation bilaterally, no wheezes, rales or rhonchi CV: Normal rate, regular rhythm, s1 and s2 distinct, no murmurs, clicks or rubs  Neuro: alert and oriented x 3, no tremor MSK: extremities atraumatic, normal gait and station Skin: intact, no rashes on exposed skin, no jaundice, no cyanosis Psych: well-groomed, cooperative, good eye contact, euthymic mood, affect mood-congruent, speech is  articulate, and thought processes clear and goal-directed  Diabetic Foot Exam - Simple   Simple Foot Form Diabetic Foot exam was performed with the following findings:  Yes 01/16/2018  4:24 PM  Visual Inspection No deformities, no ulcerations, no other skin breakdown bilaterally:  Yes Sensation Testing Intact to touch and monofilament testing bilaterally:  Yes Pulse Check Posterior Tibialis and Dorsalis pulse intact bilaterally:  Yes Comments      No results found for this or any previous visit (from the past 72 hour(s)). No results found.  Lab Results  Component Value Date   CREATININE 0.95 06/02/2017   BUN 15 06/02/2017   NA 141 06/02/2017   K 4.1 06/02/2017   CL 105 06/02/2017   CO2 28 06/02/2017   Lab Results  Component Value Date   ALT 34 06/02/2017   AST 17 06/02/2017   ALKPHOS 80 05/30/2016   BILITOT 0.4 06/02/2017     Assessment and Plan: 47 y.o. male with   .Christopher [name] was seen today for hyperglycemia.  Diagnoses and all orders for this visit:  Encounter for medication management  Controlled type 2 diabetes mellitus without complication, without long-term current use of insulin ([facility]) -     metFORMIN (GLUCOPHAGE) 500 MG tablet; Take 1 tablet (500 mg total) by mouth daily with breakfast. -     POCT HgB A1C -     Comprehensive metabolic panel  Combined hyperlipidemia associated with type 2 diabetes mellitus ([facility]) -     simvastatin (ZOCOR) 10 MG tablet; Take 1 tablet (10 mg total) by mouth daily. -     Lipid Panel w/reflex Direct LDL  Hypertension associated with diabetes ([place]) -     Comprehensive metabolic panel  Screening for blood disease -     CBC -     Comprehensive metabolic panel  Need for immunization against influenza -     Flu Vaccine QUAD 36+ mos IM   Type 2 Diabetes Lab Results  Component Value Date   HGBA1C 5.6 01/16/2018  - very well-controlled. He has taken lifestyle modification very seriously and I think he may be able to reverse  his diabetes and come off Metformin soon - eye exam UTD, requesting record from [facility] - foot exam performed today and normal - BP in range, cont ARB-thiazide - on statin, LDL goal <70, fasting lipids pending - immunizations UTD   Patient education and anticipatory guidance given Patient agrees with treatment plan Follow-up in 6 months for med mgmt or sooner as needed if symptoms worsen or fail to improve  Darlyne Russian PA-C

## 2018-01-23 ENCOUNTER — Encounter: Payer: Self-pay | Admitting: Physician Assistant

## 2018-03-19 ENCOUNTER — Ambulatory Visit: Payer: BLUE CROSS/BLUE SHIELD | Admitting: Physician Assistant

## 2018-05-13 ENCOUNTER — Encounter: Payer: Self-pay | Admitting: Physician Assistant

## 2018-05-13 ENCOUNTER — Ambulatory Visit (INDEPENDENT_AMBULATORY_CARE_PROVIDER_SITE_OTHER): Payer: BLUE CROSS/BLUE SHIELD | Admitting: Physician Assistant

## 2018-05-13 VITALS — BP 129/84 | HR 100 | Temp 98.2°F | Wt 296.0 lb

## 2018-05-13 DIAGNOSIS — I152 Hypertension secondary to endocrine disorders: Secondary | ICD-10-CM

## 2018-05-13 DIAGNOSIS — I10 Essential (primary) hypertension: Secondary | ICD-10-CM | POA: Diagnosis not present

## 2018-05-13 DIAGNOSIS — E119 Type 2 diabetes mellitus without complications: Secondary | ICD-10-CM

## 2018-05-13 DIAGNOSIS — E1159 Type 2 diabetes mellitus with other circulatory complications: Secondary | ICD-10-CM

## 2018-05-13 DIAGNOSIS — F419 Anxiety disorder, unspecified: Secondary | ICD-10-CM | POA: Diagnosis not present

## 2018-05-13 DIAGNOSIS — E782 Mixed hyperlipidemia: Secondary | ICD-10-CM | POA: Diagnosis not present

## 2018-05-13 DIAGNOSIS — F5105 Insomnia due to other mental disorder: Secondary | ICD-10-CM

## 2018-05-13 DIAGNOSIS — F4312 Post-traumatic stress disorder, chronic: Secondary | ICD-10-CM

## 2018-05-13 LAB — CBC
HCT: 45.9 % (ref 38.5–50.0)
Hemoglobin: 14.8 g/dL (ref 13.2–17.1)
MCH: 26.2 pg — AB (ref 27.0–33.0)
MCHC: 32.2 g/dL (ref 32.0–36.0)
MCV: 81.2 fL (ref 80.0–100.0)
MPV: 10.6 fL (ref 7.5–12.5)
PLATELETS: 306 10*3/uL (ref 140–400)
RBC: 5.65 10*6/uL (ref 4.20–5.80)
RDW: 15.2 % — ABNORMAL HIGH (ref 11.0–15.0)
WBC: 4.6 10*3/uL (ref 3.8–10.8)

## 2018-05-13 LAB — COMPREHENSIVE METABOLIC PANEL
AG Ratio: 1.3 (calc) (ref 1.0–2.5)
ALBUMIN MSPROF: 4.2 g/dL (ref 3.6–5.1)
ALT: 16 U/L (ref 9–46)
AST: 12 U/L (ref 10–40)
Alkaline phosphatase (APISO): 67 U/L (ref 40–115)
BUN: 20 mg/dL (ref 7–25)
CO2: 28 mmol/L (ref 20–32)
CREATININE: 1.04 mg/dL (ref 0.60–1.35)
Calcium: 9.6 mg/dL (ref 8.6–10.3)
Chloride: 105 mmol/L (ref 98–110)
GLOBULIN: 3.3 g/dL (ref 1.9–3.7)
GLUCOSE: 92 mg/dL (ref 65–99)
Potassium: 4.4 mmol/L (ref 3.5–5.3)
Sodium: 142 mmol/L (ref 135–146)
TOTAL PROTEIN: 7.5 g/dL (ref 6.1–8.1)
Total Bilirubin: 0.3 mg/dL (ref 0.2–1.2)

## 2018-05-13 LAB — LIPID PANEL W/REFLEX DIRECT LDL
CHOLESTEROL: 174 mg/dL (ref <200)
HDL: 43 mg/dL (ref >40)
LDL CHOLESTEROL (CALC): 116 mg/dL — AB
Non-HDL Cholesterol (Calc): 131 mg/dL (calc) — ABNORMAL HIGH (ref <130)
TRIGLYCERIDES: 62 mg/dL (ref <150)
Total CHOL/HDL Ratio: 4 (calc) (ref <5.0)

## 2018-05-13 LAB — POCT GLYCOSYLATED HEMOGLOBIN (HGB A1C): HbA1c, POC (prediabetic range): 5.8 % (ref 5.7–6.4)

## 2018-05-13 MED ORDER — VALSARTAN-HYDROCHLOROTHIAZIDE 320-12.5 MG PO TABS: 1.0000 | ORAL_TABLET | Freq: Every day | ORAL | 1 refills | Status: DC

## 2018-05-13 MED ORDER — DOXEPIN HCL 75 MG PO CAPS: 75.0000 mg | ORAL_CAPSULE | Freq: Every day | ORAL | 1 refills | Status: DC

## 2018-05-13 MED ORDER — METFORMIN HCL 500 MG PO TABS: 500.0000 mg | ORAL_TABLET | Freq: Every day | ORAL | 1 refills | Status: DC

## 2018-05-13 MED ORDER — PRAZOSIN HCL 1 MG PO CAPS: 1.0000 mg | ORAL_CAPSULE | Freq: Every day | ORAL | 1 refills | Status: DC

## 2018-05-13 NOTE — Progress Notes (Signed)
HPI:                                                                Christopher [name] is a 48 y.o. male who presents to [facility] today for medication management   States his PTSD is flaring up He feels more irritable and angry than usual. He is having sleep difficulties. Usual bedtime is 10 pm, waking up prematurely at 2 am, sometimes can't go back to sleep. Reports he is wearing his CPAP nightly, usually for 6-7 hours Currently taking Doxepin 50 mg nightly, which is no longer helpful.  DMII: Currently taking low-dose Metformin 500 mg daily. Compliant with medications. Has gained about 10 pounds since his last office visit. Denies polydipsia, polyuria, polyphagia. Denies blurred vision or vision change. Denies extremity pain, altered sensation and paresthesias.  Denies ulcers/wounds on feet.   HTN: taking Valsartan-HCTZ daily.Compliant with medications.Does not check BP's at home. Denies vision change, headache, chest painwith exertion, orthopnea, lightheadedness,syncopeand edema. Risk factors include:DM2, obesity, male sex, HLD  Depression screen [facility] 2/9 05/13/2018 07/14/2017 12/26/2016 08/08/2016 07/11/2016  Decreased Interest 0 0 0 1 1  Down, Depressed, Hopeless 1 0 0 0 0  PHQ - 2 Score 1 0 0 1 1  Altered sleeping 2 - 0 0 0  Tired, decreased energy 0 - 0 0 1  Change in appetite 0 - 0 1 1  Feeling bad or failure about yourself  0 - 0 0 0  Trouble concentrating 1 - 1 1 0  Moving slowly or fidgety/restless 0 - 0 0 0  Suicidal thoughts 0 - 0 0 0  PHQ-9 Score 4 - 1 3 3     GAD 7 : Generalized Anxiety Score 05/13/2018 06/28/2016  Nervous, Anxious, on Edge 2 0  Control/stop worrying 2 1  Worry too much - different things 2 1  Trouble relaxing 2 1  Restless 0 0  Easily annoyed or irritable 3 3  Afraid - awful might happen 2 1  Total GAD 7 Score 13 7      Past Medical History:  Diagnosis Date  . Depression   . Hyperlipidemia    . Hypogonadal obesity   . Lumbar degenerative disc disease   . Obesity   . OSA (obstructive sleep apnea) 12/02/2013   CPAP therapy on 17 cm H2O with a Wide size Resmed Nasal Mask Mirage FX mask and heated humidification   . Post traumatic stress disorder (PTSD)   . Prediabetes 2018   Past Surgical History:  Procedure Laterality Date  . VASECTOMY  08/2017   Social History   Tobacco Use  . Smoking status: Never Smoker  . Smokeless tobacco: Never Used  Substance Use Topics  . Alcohol use: Yes   family history includes Prostate cancer in an other family member.    ROS: negative except as noted in the HPI  Medications: Current Outpatient Medications  Medication Sig Dispense Refill  . AMBULATORY NON FORMULARY MEDICATION CPAP therapy on 17 cm H2O with a Wide size Resmed Nasal Mask Mirage FX mask and heated humidification. Use nightly for sleep.  Dx: Obstructive Sleep Apnea 1 Units 0  . aspirin EC 81 MG tablet Take 1 tablet (81 mg total) by mouth daily.  90 tablet 3  . atorvastatin (LIPITOR) 10 MG tablet Take 1 tablet (10 mg total) by mouth daily. 90 tablet 3  . benzonatate (TESSALON) 100 MG capsule Take 1-2 capsules (100-200 mg total) by mouth 3 (three) times daily as needed for cough. 40 capsule 0  . doxepin (SINEQUAN) 75 MG capsule Take 1 capsule (75 mg total) by mouth at bedtime. 90 capsule 1  . EPINEPHrine 0.3 mg/0.3 mL IJ SOAJ injection Inject into the muscle.     . meloxicam (MOBIC) 15 MG tablet One tab PO qAM with breakfast for 2 weeks, then daily prn pain. 30 tablet 3  . metFORMIN (GLUCOPHAGE) 500 MG tablet Take 1 tablet (500 mg total) by mouth daily with breakfast. 90 tablet 1  . oseltamivir (TAMIFLU) 75 MG capsule Take 1 capsule (75 mg total) by mouth every 12 (twelve) hours. 10 capsule 0  . prazosin (MINIPRESS) 1 MG capsule Take 1 capsule (1 mg total) by mouth at bedtime. 90 capsule 1  . sildenafil (REVATIO) 20 MG tablet Take 1-5 tablets (20-100 mg total) by mouth as  needed. 30 tablet 2  . valsartan-hydrochlorothiazide (DIOVAN-HCT) 320-12.5 MG tablet Take 1 tablet by mouth daily. 90 tablet 1   No current facility-administered medications for this visit.    No Known Allergies     Objective:  BP 129/84   Pulse 100   Temp 98.2 F (36.8 C) (Oral)   Wt 296 lb (134.3 kg)   SpO2 97%   BMI 45.01 kg/m  Gen:  alert, not ill-appearing, no distress, appropriate for age, obese male HEENT: head normocephalic without obvious abnormality, conjunctiva and cornea clear, trachea midline Pulm: Normal work of breathing, normal phonation, clear to auscultation bilaterally, no wheezes, rales or rhonchi CV: Normal rate, regular rhythm, s1 and s2 distinct, no murmurs, clicks or rubs  Neuro: alert and oriented x 3, no tremor MSK: extremities atraumatic, normal gait and station Skin: intact, no rashes on exposed skin, no jaundice, no cyanosis Psych: well-groomed, cooperative, good eye contact, depressed mood, affect mood-congruent, speech is articulate, and thought processes clear and goal-directed, good insight, normal judgment, no SI/HI  Lab Results  Component Value Date   HGBA1C 5.8 05/13/2018    Lab Results  Component Value Date   CREATININE 1.04 05/13/2018   BUN 20 05/13/2018   NA 142 05/13/2018   K 4.4 05/13/2018   CL 105 05/13/2018   CO2 28 05/13/2018   Lab Results  Component Value Date   ALT 16 05/13/2018   AST 12 05/13/2018   ALKPHOS 80 05/30/2016   BILITOT 0.3 05/13/2018   Lab Results  Component Value Date   CHOL 174 05/13/2018   HDL 43 05/13/2018   LDLCALC 116 (H) 05/13/2018   TRIG 62 05/13/2018   CHOLHDL 4.0 05/13/2018   The 10-year ASCVD risk score Mikey Bussing DC Jr., et al., 2013) is: 13.1%   Values used to calculate the score:     Age: 4 years     Sex: Male     Is Non-Hispanic African American: Yes     Diabetic: Yes     Tobacco smoker: No     Systolic Blood Pressure: 741 mmHg     Is BP treated: Yes     HDL Cholesterol: 43 mg/dL      Total Cholesterol: 174 mg/dL   No results found for this or any previous visit (from the past 72 hour(s)). No results found.    Assessment and Plan: 48 y.o. male with   .  Diagnoses and all orders for this visit:  Controlled type 2 diabetes mellitus without complication, without long-term current use of insulin (HCC) -     POCT HgB A1C -     metFORMIN (GLUCOPHAGE) 500 MG tablet; Take 1 tablet (500 mg total) by mouth daily with breakfast.  Hypertension associated with diabetes (HCC) -     valsartan-hydrochlorothiazide (DIOVAN-HCT) 320-12.5 MG tablet; Take 1 tablet by mouth daily.  Chronic post-traumatic stress disorder (PTSD) -     prazosin (MINIPRESS) 1 MG capsule; Take 1 capsule (1 mg total) by mouth at bedtime. -     doxepin (SINEQUAN) 75 MG capsule; Take 1 capsule (75 mg total) by mouth at bedtime.  Insomnia secondary to anxiety   Diabetes - very well-controlled. A1c<6.0 - due to weight gain, will continue Metformin - eye exam UTD, requesting record [facility] - foot exam UTD, normal - BP in range, cont ARB-thiazide - on statin, LDL goal <70, fasting lipids pending - immunizations UTD  Chronic PTSD GAD7=13 Increasing Doxepin to 75 mg nightly Re-starting Prazosin 1 mg QHS May need to consider re-starting Viibryd or SSRI depending on response He is planning to follow-up with the [facility] for therapy Counseled on sleep hygiene   Patient education and anticipatory guidance given Patient agrees with treatment plan Follow-up in 2 months for PTSD/insomnia as needed if symptoms worsen or fail to improve  Darlyne Russian PA-C

## 2018-05-13 NOTE — Patient Instructions (Addendum)
Diabetes Preventive Care: - annual foot exam  - annual dilated eye exam with an eye doctor - self foot exams at least weekly - pneumonia vaccine once (booster in 5 years and at age 48) - annual influenza vaccine - twice yearly dental cleanings and yearly exam - goal blood pressure <140/90, ideally <130/80 - LDL cholesterol <70 - A1C <7.0 - body mass index (BMI) <25.0 - follow-up every 3 months if your A1C is not at goal - follow-up every 6 months if diabetes is well controlled  Sleep Hygiene . Limiting daytime naps to 30 minutes . Napping does not make up for inadequate nighttime sleep. However, a short nap of 20-30 minutes can help to improve mood, alertness and performance.  . Avoiding stimulants such as  caffeine and nicotine close to bedtime.  And when it comes to alcohol, moderation is key 4. While alcohol is well-known to help you fall asleep faster, too much close to bedtime can disrupt sleep in the second half of the night as the body begins to process the alcohol.    . Exercising to promote good quality sleep.  As little as 10 minutes of aerobic exercise, such as walking or cycling, can drastically improve nighttime sleep quality.  For the best night's sleep, most people should avoid strenuous workouts close to bedtime. However, the effect of intense nighttime exercise on sleep differs from person to person, so find out what works best for you.   . Steering clear of food that can be disruptive right before sleep.   Heavy or rich foods, fatty or fried meals, spicy dishes, citrus fruits, and carbonated drinks can trigger indigestion for some people. When this occurs close to bedtime, it can lead to painful heartburn that disrupts sleep. . Ensuring adequate exposure to natural light.  This is particularly important for individuals who may not venture outside frequently. Exposure to sunlight during the day, as well as darkness at night, helps to maintain a healthy sleep-wake cycle  . [place] Kitchen Establishing a regular relaxing bedtime routine.  A regular nightly routine helps the body recognize that it is bedtime. This could include taking warm shower or bath, reading a book, or light stretches. When possible, try to avoid emotionally upsetting conversations and activities before attempting to sleep. . Making sure that the sleep environment is pleasant.  Mattress and pillows should be comfortable. The bedroom should be cool - between 60 and 67 degrees - for optimal sleep. Bright light from lamps, cell phone and TV screens can make it difficult to fall asleep4, so turn those light off or adjust them when possible. Consider using blackout curtains, eye shades, ear plugs, "white noise" machines, humidifiers, fans and other devices that can make the bedroom more relaxing. . Meditation. YouTube [address]. There are many smartphone apps as well   Living With Post-Traumatic Stress Disorder If you have been diagnosed with post-traumatic stress disorder (PTSD), you may be relieved that you now know why you have felt or behaved a certain way. Still, you may feel overwhelmed about the treatment ahead. You may also wonder how to get the support you need and how to deal with the condition day-to-day. If you are living with PTSD, there are ways to help you recover from it and manage your symptoms. How to manage lifestyle changes Managing stress Stress is your body's reaction to life changes and events, both good and bad. Stress can make PTSD worse. Take the following steps to cope with stress: Talk with your  health care provider or a counselor if you would like to learn more about techniques to reduce your stress. He or she may suggest some stress reduction techniques such as: Muscle relaxation exercises. Regular exercise. Meditation, yoga, or other mind-body exercises. Breathing exercises. Listening to quiet music. Spending time outside. Maintain a healthy lifestyle. Eat a healthy diet,  exercise regularly, get plenty of sleep, and take time to relax. Spend time with others. Talk with them about how you are feeling and what kind of support you need. Try to not isolate yourself, even though you may feel like doing that. Isolating yourself can delay your recovery. Do activities and hobbies that you enjoy. Pace yourself when doing stressful things. Take breaks, and reward yourself when you finish. Make sure that you do not overload your schedule.  Medicines Your health care provider may suggest certain medicines if he or she feels that they will help to improve your condition. Antidepressants or antipsychotic medicines may be used to treat PTSD. Avoid using alcohol and other substances that may prevent your medicines from working properly (may interact). It is also important to: Talk with your pharmacist or health care provider about all medicines that you take, their possible side effects, and which medicines are safe to take together. Make it your goal to take part in all treatment decisions (shared decision-making). Ask about possible side effects of medicines that your health care provider recommends, and tell him or her how you feel about having those side effects. It is best if shared decision-making with your health care provider is part of your total treatment plan. If your health care provider prescribes a medicine, you may not notice the full benefits of it for 4-8 weeks. Most people who are treated for PTSD need to take medicine for at least 6-12 months after they feel better. If you are taking medicines as part of your treatment, do not stop taking medicines before you ask your health care provider if it is safe to stop. You may need to have the medicine slowly decreased (tapered) over time to lower the risk of harmful side effects. Relationships Many people who have PTSD have difficulty trusting others. Make an effort to: Take risks and develop trust with close friends and  family members. Developing trust in others can help you feel safe and connect you with emotional support. Be open and honest about your feelings. Try to have fun and relax in safe spaces, such as with friends and family. Think about going to couples counseling, family education classes, or family therapy. Your loved ones may not always know how to be supportive. Therapy can be helpful for everyone. How to recognize changes in your condition Be aware of your symptoms and how often you have them. The following symptoms mean that you need to seek help for your PTSD: You feel suspicious and angry. You have repeated flashbacks. You avoid going out or being with others. You have an increasing number of fights with close friends or family members, such as your spouse. You have thoughts about hurting yourself or others. You cannot get relief from feelings of depression or anxiety. Where to find support Talking to others Explain that PTSD is a mental health problem. It is something that a person can develop after experiencing or seeing a life-threatening event. Tell them that PTSD makes you feel stress like you did during the event. Talk to your loved ones about the symptoms you have. Also tell them what things or situations can  cause symptoms to start (are triggers for you). Assure your loved ones that there are treatments to help PTSD. Discuss possibly seeking family therapy or couples therapy. If you are worried or fearful about seeking treatment, ask for support. Finances Not all insurance plans cover mental health care, so it is important to check with your insurance carrier. If paying for co-pays or counseling services is a problem, search for a local or county mental health care center. Public mental health care services may be offered there at a low cost or no cost when you are not able to see a private health care provider. If you are a veteran, contact a local veterans organization or veterans  hospital for more information. If you are taking medicine for PTSD, you may be able to get the genericform, which may be less expensive than brand-name medicine. Some makers of prescription medicines also offer help to patients who cannot afford the medicines that they need. Community resources Find a support group in your community. Often, groups are available for [employer] veterans, trauma victims, and family members or caregivers. Look into volunteer opportunities. Taking part in these can help you feel more connected to your community. Contact a local organization to find out if you are eligible for a service dog. Keep daily contact with at least one trusted friend or family member. Follow these instructions at home: Lifestyle Exercise regularly. Try to do 30 or more minutes of physical activity on most days of the week. Try to get 7-9 hours of sleep each night. To help with sleep: Keep your bedroom cool and dark. Do not eat a heavy meal during the hour before you go to bed. Do not drink alcohol or caffeinated drinks before bed. Avoid screen time before bedtime. This means avoiding use of your TV, computer, tablet, and cell phone. Avoid using alcohol or drugs. Practice self-soothing skills and use them daily. Try to have fun and seek humor in your life. General instructions If your PTSD is affecting your marriage or family, seek help from a family therapist. Take over-the-counter and prescription medicines only as told by your health care provider. Make sure to let all of your health care providers know that you have PTSD. This is especially important if you are having surgery or need to be admitted to the hospital. Keep all follow-up visits as told by your health care providers. This is important. Where to find more information Go to this website to find more information about PTSD, treatment for PTSD, and how to get support: [facility] for PTSD: www.ptsd.PaintballBuzz.cz Contact a health  care provider if: Your symptoms get worse or they do not get better. Get help right away if: You have thoughts about hurting yourself or others. If you ever feel like you may hurt yourself or others, or have thoughts about taking your own life, get help right away. You can go to your nearest emergency department or call: Your local emergency services (911 in the [place] A suicide crisis helpline, such as the [facility] at 251-586-1123. This is open 24-hours a day. Summary If you are living with PTSD, there are ways to help you recover from it and manage your symptoms. Find supportive environments and people who understand PTSD. Spend time in those places, and maintain contact with those people. Work with your health care team to create a management plan that includes counseling, stress management techniques, and healthy lifestyle habits. This information is not intended to replace advice given to  you by your health care provider. Make sure you discuss any questions you have with your health care provider. Document Released: 08/22/2016 Document Revised: 08/22/2016 Document Reviewed: 08/22/2016 Elsevier Interactive Patient Education  2019 [employer].

## 2018-05-18 MED ORDER — ATORVASTATIN CALCIUM 10 MG PO TABS: 10.0000 mg | ORAL_TABLET | Freq: Every day | ORAL | 3 refills | Status: DC

## 2018-05-18 NOTE — Addendum Note (Signed)
Addended by: Nelson Chimes E on: 05/18/2018 07:41 AM   Modules accepted: Orders

## 2018-05-20 ENCOUNTER — Encounter: Payer: Self-pay | Admitting: Emergency Medicine

## 2018-05-20 ENCOUNTER — Emergency Department
Admission: EM | Admit: 2018-05-20 | Discharge: 2018-05-20 | Disposition: A | Discharge status: Home / Self Care | Payer: BLUE CROSS/BLUE SHIELD | Source: Home / Self Care | Attending: Emergency Medicine | Admitting: Emergency Medicine

## 2018-05-20 DIAGNOSIS — J111 Influenza due to unidentified influenza virus with other respiratory manifestations: Secondary | ICD-10-CM

## 2018-05-20 LAB — POCT INFLUENZA A/B
INFLUENZA A, POC: NEGATIVE
INFLUENZA B, POC: NEGATIVE

## 2018-05-20 LAB — POCT RAPID STREP A (OFFICE): Rapid Strep A Screen: NEGATIVE

## 2018-05-20 LAB — POCT FASTING CBG KUC MANUAL ENTRY: POCT Glucose (KUC): 101 mg/dL — AB (ref 70–99)

## 2018-05-20 MED ORDER — OSELTAMIVIR PHOSPHATE 75 MG PO CAPS: 75.0000 mg | ORAL_CAPSULE | Freq: Two times a day (BID) | ORAL | 0 refills | Status: DC

## 2018-05-20 MED ORDER — BENZONATATE 100 MG PO CAPS: 100.0000 mg | ORAL_CAPSULE | Freq: Three times a day (TID) | ORAL | 0 refills | Status: DC | PRN

## 2018-05-20 NOTE — ED Provider Notes (Signed)
[name]    CSN: [id number] Arrival date & time: 05/20/18  0936     History   Chief Complaint Chief Complaint  Patient presents with  . Sore Throat    HPI Christopher [name] is a 48 y.o. male.   HPI Patient was in his usual state of health until last week when he developed an upper respiratory infection.  He thought this was a simple cold and this seemed to resolve by the weekend.  He felt fine this weekend was able to go to work on Monday and then on Tuesday while at work he developed severe myalgias, sore throat, hoarseness, and associated cough.  He took some Mucinex last night but continues to feel poorly today.  He did have a flu shot.  There has been a number of flu cases at his son's school.  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  Of note he does have diabetes and was told recently his sugar was under good control. Past Medical History:  Diagnosis Date  . Depression   . Hyperlipidemia   . Hypogonadal obesity   . Lumbar degenerative disc disease   . Obesity   . OSA (obstructive sleep apnea) 12/02/2013   CPAP therapy on 17 cm H2O with a Wide size Resmed Nasal Mask Mirage FX mask and heated humidification   . Post traumatic stress disorder (PTSD)   . Prediabetes 2018    Patient Active Problem List   Diagnosis Date Noted  . Peripheral edema 09/16/2017  . Class 3 severe obesity due to excess calories with serious comorbidity in adult ([place]) 06/15/2017  . Controlled type 2 diabetes mellitus without complication, without long-term current use of insulin ([place]) 06/15/2017  . Encounter for medication monitoring 06/02/2017  . 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 Hypertension associated with diabetes ([place]) 01/10/2017  . Elevated hematocrit 11/24/2016  . Elevated blood pressure reading 08/11/2016  . Hypogonadism in male 06/01/2016  . Abnormal prostate specific antigen (PSA) 06/01/2016  . Moderate episode of  recurrent major depressive disorder ([place]) 05/30/2016  . Chronic post-traumatic stress disorder (PTSD) 05/30/2016  . Nightmares associated with chronic post-traumatic stress disorder 05/30/2016  . Lumbar degenerative disc disease 02/05/2016  . Insomnia 12/22/2015  . OSA (obstructive sleep apnea) 12/02/2013  . Severe obesity (BMI >= 40) ([place]) 12/02/2013  . Family history of prostate cancer 12/02/2013  . Hyperlipidemia     Past Surgical History:  Procedure Laterality Date  . VASECTOMY  08/2017       Home Medications    Prior to Admission medications   Medication Sig Start Date End Date Taking? Authorizing Provider  AMBULATORY NON FORMULARY MEDICATION CPAP therapy on 17 cm H2O with a Wide size Resmed Nasal Mask Mir[place] FX mask and heated humidification. Use nightly for sleep.  Dx: Obstructive Sleep Apnea 02/09/15   Marcial Pacas, DO  aspirin EC 81 MG tablet Take 1 tablet (81 mg total) by mouth daily. 11/24/16   Trixie Dredge, PA-C  atorvastatin (LIPITOR) 10 MG tablet Take 1 tablet (10 mg total) by mouth daily. 05/18/18   Trixie Dredge, PA-C  benzonatate (TESSALON) 100 MG capsule Take 1-2 capsules (100-200 mg total) by mouth 3 (three) times daily as needed for cough. 05/20/18   Darlyne Russian, MD  doxepin (SINEQUAN) 75 MG capsule Take 1 capsule (75 mg total) by mouth at bedtime. 05/13/18   Trixie Dredge, PA-C  EPINEPHrine 0.3 mg/0.3 mL  IJ SOAJ injection Inject into the muscle.  11/25/13   [provider]  meloxicam (MOBIC) 15 MG tablet One tab PO qAM with breakfast for 2 weeks, then daily prn pain. 02/05/16   Silverio Decamp, MD  metFORMIN (GLUCOPHAGE) 500 MG tablet Take 1 tablet (500 mg total) by mouth daily with breakfast. 05/13/18   Trixie Dredge, PA-C  oseltamivir (TAMIFLU) 75 MG capsule Take 1 capsule (75 mg total) by mouth every 12 (twelve) hours. 05/20/18   Darlyne Russian, MD  prazosin (MINIPRESS) 1 MG capsule Take 1 capsule  (1 mg total) by mouth at bedtime. 05/13/18   Trixie Dredge, PA-C  sildenafil (REVATIO) 20 MG tablet Take 1-5 tablets (20-100 mg total) by mouth as needed. 06/02/17   Trixie Dredge, PA-C  valsartan-hydrochlorothiazide (DIOVAN-HCT) 320-12.5 MG tablet Take 1 tablet by mouth daily. 05/13/18   Trixie Dredge, PA-C    Family History Family History  Problem Relation Age of Onset  . Prostate cancer Other     Social History Social History   Tobacco Use  . Smoking status: Never Smoker  . Smokeless tobacco: Never Used  Substance Use Topics  . Alcohol use: Yes  . Drug use: No     Allergies   Patient has no known allergies.   Review of Systems Review of Systems  Constitutional: Positive for fatigue. Negative for fever.  HENT: Positive for congestion and sore throat.   Eyes: Negative.   Respiratory: Positive for cough. Negative for shortness of breath.   Cardiovascular:       Chest is sore when he coughs but no exertional chest pain.  Gastrointestinal: Negative.   Endocrine:       History of diabetes mellitus  Genitourinary: Negative.      Physical Exam Triage Vital Signs ED Triage Vitals [05/20/18 1003]  Enc Vitals Group     BP 120/75     Pulse Rate 84     Resp      Temp 98.1 F (36.7 C)     Temp Source Oral     SpO2 97 %     Weight 295 lb (133.8 kg)     Height 5\' 8"  (1.727 m)     Head Circumference      Peak Flow      Pain Score 6     Pain Loc      Pain Edu?      Excl. in [place]?    No data found.  Updated Vital Signs BP 120/75 (BP Location: Right Arm)   Pulse 84   Temp 98.1 F (36.7 C) (Oral)   Ht 5\' 8"  (1.727 m)   Wt 133.8 kg   SpO2 97%   BMI 44.85 kg/m   Visual Acuity Right Eye Distance:   Left Eye Distance:   Bilateral Distance:    Right Eye Near:   Left Eye Near:    Bilateral Near:     Physical Exam Constitutional:      Comments: Patient is ill but in no distress.  He is not toxic appearing.  HENT:      Head: Normocephalic and atraumatic.     Right Ear: Tympanic membrane normal.     Left Ear: Tympanic membrane normal.     Mouth/Throat:     Mouth: Mucous membranes are moist.     Tonsils: Swelling: 1+ on the right. 1+ on the left.  Pulmonary:     Effort: Pulmonary effort is normal.  Breath sounds: No wheezing.  Abdominal:     Palpations: Abdomen is soft.  Skin:    General: Skin is warm.      UC Treatments / Results  Labs (all labs ordered are listed, but only abnormal results are displayed) Labs Reviewed  POCT FASTING CBG [facility] MANUAL ENTRY - Abnormal; Notable for the following components:      Result Value   POCT Glucose ([facility]) 101 (*)    All other components within normal limits  STREP A DNA PROBE  POCT RAPID STREP A (OFFICE)  POCT INFLUENZA A/B    EKG None  Radiology No results found.  Procedures Procedures (including critical care time)  Medications Ordered in UC Medications - No data to display  Initial Impression / Assessment and Plan / UC Course  I have reviewed the triage vital signs and the nursing notes.  Pertinent labs & imaging results that were available during my care of the patient were reviewed by me and considered in my medical decision making (see chart for details). Patient does have a history of recurrent strep but none for a number of years.  He has had a flu shot.  Random glucose checked was 101.  Flu test was negative glucose as stated was in range at 101. we will treat with fluids, rest, and treat with Tamiflu even with the negative flu test results.  He will be given Tessalon Per[place] for cough.  Strep culture was sent.  Strep screen negative.     Final Clinical Impressions(s) / UC Diagnoses   Final diagnoses:  Flu syndrome     Discharge Instructions     Take medication as instructed. Rest at home. If you develop chest pain or shortness of breath please present to the emergency room for further evaluation.    ED Prescriptions     Medication Sig Dispense Auth. Provider   oseltamivir (TAMIFLU) 75 MG capsule Take 1 capsule (75 mg total) by mouth every 12 (twelve) hours. 10 capsule Darlyne Russian, MD   benzonatate (TESSALON) 100 MG capsule Take 1-2 capsules (100-200 mg total) by mouth 3 (three) times daily as needed for cough. 40 capsule Darlyne Russian, MD     Controlled Substance Prescriptions [place] Controlled Substance Registry consulted? Not Applicable   Darlyne Russian, MD 05/20/18 1036

## 2018-05-20 NOTE — ED Triage Notes (Signed)
Patient had a cold last week started feeling better and yesterday sore throat, hoarseness, cough and body aches.

## 2018-05-20 NOTE — Discharge Instructions (Addendum)
Take medication as instructed. Rest at home. If you develop chest pain or shortness of breath please present to the emergency room for further evaluation.

## 2018-05-21 ENCOUNTER — Encounter: Payer: Self-pay | Admitting: Physician Assistant

## 2018-05-21 ENCOUNTER — Telehealth: Payer: Self-pay

## 2018-05-21 LAB — STREP A DNA PROBE: Group A Strep Probe: NOT DETECTED

## 2018-05-21 NOTE — Telephone Encounter (Signed)
Left voice message inquiring about patients status. Given neg strep results. Encouraged patient to call with questions or concerns.

## 2018-05-25 ENCOUNTER — Emergency Department
Admission: EM | Admit: 2018-05-25 | Discharge: 2018-05-25 | Disposition: A | Discharge status: Home / Self Care | Payer: BLUE CROSS/BLUE SHIELD | Source: Home / Self Care | Attending: Emergency Medicine | Admitting: Emergency Medicine

## 2018-05-25 ENCOUNTER — Encounter: Payer: Self-pay | Admitting: Emergency Medicine

## 2018-05-25 DIAGNOSIS — R197 Diarrhea, unspecified: Secondary | ICD-10-CM | POA: Diagnosis not present

## 2018-05-25 LAB — POCT CBC W AUTO DIFF (K'VILLE URGENT CARE)

## 2018-05-25 NOTE — ED Provider Notes (Signed)
[name]    CSN: [id number] Arrival date & time: 05/25/18  1026     History   Chief Complaint Chief Complaint  Patient presents with  . Diarrhea    HPI Christopher [name] is a 48 y.o. male.   HPI Patient was seen 5 days ago with a flulike illness.  He was treated with Tamiflu despite negative flu testing.  2 days ago he started having frequent loose stools.  There has been no associated blood or mucus.  His respiratory symptoms have essentially resolved.  He has had no fever or chills.  He initially took Pepto-Bismol but subsequently stopped this because he did not feel it was helping.  He denies any recent travel or antibiotics. Past Medical History:  Diagnosis Date  . Depression   . Hyperlipidemia   . Hypogonadal obesity   . Lumbar degenerative disc disease   . Obesity   . OSA (obstructive sleep apnea) 12/02/2013   CPAP therapy on 17 cm H2O with a Wide size Resmed Nasal Mask Mir[place] FX mask and heated humidification   . Post traumatic stress disorder (PTSD)   . Prediabetes 2018    Patient Active Problem List   Diagnosis Date Noted  . Peripheral edema 09/16/2017  . Class 3 severe obesity due to excess calories with serious comorbidity in adult ([place]) 06/15/2017  . Controlled type 2 diabetes mellitus without complication, without long-term current use of insulin ([place]) 06/15/2017  . Encounter for medication monitoring 06/02/2017  . 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 Hypertension associated with diabetes ([place]) 01/10/2017  . Elevated hematocrit 11/24/2016  . Elevated blood pressure reading 08/11/2016  . Hypogonadism in male 06/01/2016  . Abnormal prostate specific antigen (PSA) 06/01/2016  . Moderate episode of recurrent major depressive disorder ([place]) 05/30/2016  . Chronic post-traumatic stress disorder (PTSD) 05/30/2016  . Nightmares associated with chronic post-traumatic stress disorder 05/30/2016  . Lumbar degenerative disc disease 02/05/2016    . Insomnia secondary to anxiety 12/22/2015  . OSA (obstructive sleep apnea) 12/02/2013  . Severe obesity (BMI >= 40) ([place]) 12/02/2013  . Family history of prostate cancer 12/02/2013  . Hyperlipidemia     Past Surgical History:  Procedure Laterality Date  . VASECTOMY  08/2017       Home Medications    Prior to Admission medications   Medication Sig Start Date End Date Taking? Authorizing Provider  AMBULATORY NON FORMULARY MEDICATION CPAP therapy on 17 cm H2O with a Wide size Resmed Nasal Mask Mir[place] FX mask and heated humidification. Use nightly for sleep.  Dx: Obstructive Sleep Apnea 02/09/15   Marcial Pacas, DO  aspirin EC 81 MG tablet Take 1 tablet (81 mg total) by mouth daily. 11/24/16   Trixie Dredge, PA-C  atorvastatin (LIPITOR) 10 MG tablet Take 1 tablet (10 mg total) by mouth daily. 05/18/18   Trixie Dredge, PA-C  benzonatate (TESSALON) 100 MG capsule Take 1-2 capsules (100-200 mg total) by mouth 3 (three) times daily as needed for cough. 05/20/18   Darlyne Russian, MD  doxepin (SINEQUAN) 75 MG capsule Take 1 capsule (75 mg total) by mouth at bedtime. 05/13/18   Trixie Dredge, PA-C  EPINEPHrine 0.3 mg/0.3 mL IJ SOAJ injection Inject into the muscle.  11/25/13   [provider]  meloxicam (MOBIC) 15 MG tablet One tab PO qAM with breakfast for 2 weeks, then daily prn pain. 02/05/16   Silverio Decamp, MD  metFORMIN (GLUCOPHAGE) 500 MG tablet Take 1 tablet (500  mg total) by mouth daily with breakfast. 05/13/18   Trixie Dredge, PA-C  oseltamivir (TAMIFLU) 75 MG capsule Take 1 capsule (75 mg total) by mouth every 12 (twelve) hours. 05/20/18   Darlyne Russian, MD  prazosin (MINIPRESS) 1 MG capsule Take 1 capsule (1 mg total) by mouth at bedtime. 05/13/18   Trixie Dredge, PA-C  sildenafil (REVATIO) 20 MG tablet Take 1-5 tablets (20-100 mg total) by mouth as needed. 06/02/17   Trixie Dredge, PA-C   valsartan-hydrochlorothiazide (DIOVAN-HCT) 320-12.5 MG tablet Take 1 tablet by mouth daily. 05/13/18   Trixie Dredge, PA-C    Family History Family History  Problem Relation Age of Onset  . Prostate cancer Other     Social History Social History   Tobacco Use  . Smoking status: Never Smoker  . Smokeless tobacco: Never Used  Substance Use Topics  . Alcohol use: Yes  . Drug use: No     Allergies   Patient has no known allergies.   Review of Systems Review of Systems  Eyes: Negative.   Gastrointestinal: Positive for diarrhea. Negative for vomiting.  Genitourinary: Negative.      Physical Exam Triage Vital Signs ED Triage Vitals  Enc Vitals Group     BP 05/25/18 1053 121/75     Pulse Rate 05/25/18 1053 86     Resp --      Temp 05/25/18 1053 98.3 F (36.8 C)     Temp Source 05/25/18 1053 Oral     SpO2 05/25/18 1053 98 %     Weight 05/25/18 1054 290 lb (131.5 kg)     Height --      Head Circumference --      Peak Flow --      Pain Score 05/25/18 1054 0     Pain Loc --      Pain Edu? --      Excl. in [place]? --    No data found.  Updated Vital Signs BP 121/75 (BP Location: Right Arm)   Pulse 86   Temp 98.3 F (36.8 C) (Oral)   Wt 131.5 kg   SpO2 98%   BMI 44.09 kg/m   Visual Acuity Right Eye Distance:   Left Eye Distance:   Bilateral Distance:    Right Eye Near:   Left Eye Near:    Bilateral Near:     Physical Exam Constitutional:      Appearance: Normal appearance.  HENT:     Head: Normocephalic.     Right Ear: Tympanic membrane normal.     Left Ear: Tympanic membrane normal.     Nose: Nose normal.     Mouth/Throat:     Mouth: Mucous membranes are moist.  Neck:     Musculoskeletal: Normal range of motion.  Cardiovascular:     Rate and Rhythm: Normal rate and regular rhythm.  Pulmonary:     Effort: Pulmonary effort is normal.     Breath sounds: Normal breath sounds.  Abdominal:     Comments: The abdomen is not distended.   There are no definite areas of tenderness.  There are no masses and no rebound is noted.  Musculoskeletal: Normal range of motion.  Neurological:     Mental Status: He is alert.      UC Treatments / Results  Labs (all labs ordered are listed, but only abnormal results are displayed) Labs Reviewed  POCT CBC W AUTO DIFF ([place])   CBC done shows  a white count of 5900 Hemoglobin 14.4. Platelet 267,000. EKG None  Radiology No results found.  Procedures Procedures (including critical care time)  Medications Ordered in UC Medications - No data to display  Initial Impression / Assessment and Plan / UC Course  I have reviewed the triage vital signs and the nursing notes.  Pertinent labs & imaging results that were available during my care of the patient were reviewed by me and considered in my medical decision making (see chart for details). History and exam consistent with diarrhea following his flulike illness.  He has not been on antibiotics or had recent travel.  Will treat with Imodium right ear and gave him 3 more days off work.      Final Clinical Impressions(s) / UC Diagnoses   Final diagnoses:  Diarrhea, unspecified type     Discharge Instructions     Follow instructions on your diarrhea instruction sheet. Take Imodium AD 3 times a day. Plan on return to work on Thursday.    ED Prescriptions    None     Controlled Substance Prescriptions [place] Controlled Substance Registry consulted? Not Applicable   Darlyne Russian, MD 05/25/18 1407

## 2018-05-25 NOTE — ED Triage Notes (Signed)
Pt states he was dx with flu and took tamiflu last week. He has been having diarrhea for 3 days. No N/V. Taking pepto/.

## 2018-05-25 NOTE — Discharge Instructions (Addendum)
Follow instructions on your diarrhea instruction sheet. Take Imodium AD 3 times a day. Plan on return to work on Thursday.

## 2018-05-28 ENCOUNTER — Encounter: Payer: Self-pay | Admitting: Physician Assistant

## 2018-06-19 DIAGNOSIS — G4733 Obstructive sleep apnea (adult) (pediatric): Secondary | ICD-10-CM | POA: Diagnosis not present

## 2018-06-30 ENCOUNTER — Other Ambulatory Visit: Payer: Self-pay

## 2018-06-30 MED ORDER — AMBULATORY NON FORMULARY MEDICATION: 0 refills | Status: AC

## 2018-06-30 NOTE — Telephone Encounter (Signed)
Pt left vm stating that his c-pap machine has stopped working.  He did contact [facility].  He was told by them to contact us to have [place] fax over a new Rx for a new machine. Order pending. Please advise. -EH/RMA

## 2018-07-09 DIAGNOSIS — G4733 Obstructive sleep apnea (adult) (pediatric): Secondary | ICD-10-CM | POA: Diagnosis not present

## 2018-07-13 ENCOUNTER — Ambulatory Visit: Payer: BLUE CROSS/BLUE SHIELD | Admitting: Physician Assistant

## 2018-07-17 ENCOUNTER — Other Ambulatory Visit: Payer: Self-pay

## 2018-07-17 ENCOUNTER — Ambulatory Visit: Payer: BLUE CROSS/BLUE SHIELD | Admitting: Physician Assistant

## 2018-07-17 ENCOUNTER — Encounter: Payer: Self-pay | Admitting: Physician Assistant

## 2018-07-17 VITALS — BP 124/75 | HR 87 | Wt 305.0 lb

## 2018-07-17 DIAGNOSIS — F4312 Post-traumatic stress disorder, chronic: Secondary | ICD-10-CM

## 2018-07-17 DIAGNOSIS — E119 Type 2 diabetes mellitus without complications: Secondary | ICD-10-CM

## 2018-07-17 MED ORDER — METFORMIN HCL 500 MG PO TABS: 500.0000 mg | ORAL_TABLET | Freq: Every day | ORAL | 1 refills | Status: DC

## 2018-07-17 NOTE — Patient Instructions (Signed)
Living With Post-Traumatic Stress Disorder  If you have been diagnosed with post-traumatic stress disorder (PTSD), you may be relieved that you now know why you have felt or behaved a certain way. Still, you may feel overwhelmed about the treatment ahead. You may also wonder how to get the support you need and how to deal with the condition day-to-day.  If you are living with PTSD, there are ways to help you recover from it and manage your symptoms.  How to manage lifestyle changes  Managing stress  Stress is your body's reaction to life changes and events, both good and bad. Stress can make PTSD worse. Take the following steps to cope with stress:   Talk with your health care provider or a counselor if you would like to learn more about techniques to reduce your stress. He or she may suggest some stress reduction techniques such as:  ? Muscle relaxation exercises.  ? Regular exercise.  ? Meditation, yoga, or other mind-body exercises.  ? Breathing exercises.  ? Listening to quiet music.  ? Spending time outside.   Maintain a healthy lifestyle. Eat a healthy diet, exercise regularly, get plenty of sleep, and take time to relax.   Spend time with others. Talk with them about how you are feeling and what kind of support you need. Try to not isolate yourself, even though you may feel like doing that. Isolating yourself can delay your recovery.   Do activities and hobbies that you enjoy.   Pace yourself when doing stressful things. Take breaks, and reward yourself when you finish. Make sure that you do not overload your schedule.    Medicines  Your health care provider may suggest certain medicines if he or she feels that they will help to improve your condition. Antidepressants or antipsychotic medicines may be used to treat PTSD. Avoid using alcohol and other substances that may prevent your medicines from working properly (may interact). It is also important to:   Talk with your pharmacist or health care  provider about all medicines that you take, their possible side effects, and which medicines are safe to take together.   Make it your goal to take part in all treatment decisions (shared decision-making). Ask about possible side effects of medicines that your health care provider recommends, and tell him or her how you feel about having those side effects. It is best if shared decision-making with your health care provider is part of your total treatment plan.  If your health care provider prescribes a medicine, you may not notice the full benefits of it for 4-8 weeks. Most people who are treated for PTSD need to take medicine for at least 6-12 months after they feel better. If you are taking medicines as part of your treatment, do not stop taking medicines before you ask your health care provider if it is safe to stop. You may need to have the medicine slowly decreased (tapered) over time to lower the risk of harmful side effects.  Relationships  Many people who have PTSD have difficulty trusting others. Make an effort to:   Take risks and develop trust with close friends and family members. Developing trust in others can help you feel safe and connect you with emotional support.   Be open and honest about your feelings.   Try to have fun and relax in safe spaces, such as with friends and family.   Think about going to couples counseling, family education classes, or family therapy. Your loved   ones may not always know how to be supportive. Therapy can be helpful for everyone.  How to recognize changes in your condition  Be aware of your symptoms and how often you have them. The following symptoms mean that you need to seek help for your PTSD:   You feel suspicious and angry.   You have repeated flashbacks.   You avoid going out or being with others.   You have an increasing number of fights with close friends or family members, such as your spouse.   You have thoughts about hurting yourself or  others.   You cannot get relief from feelings of depression or anxiety.  Where to find support  Talking to others   Explain that PTSD is a mental health problem. It is something that a person can develop after experiencing or seeing a life-threatening event. Tell them that PTSD makes you feel stress like you did during the event.   Talk to your loved ones about the symptoms you have. Also tell them what things or situations can cause symptoms to start (are triggers for you).   Assure your loved ones that there are treatments to help PTSD. Discuss possibly seeking family therapy or couples therapy.   If you are worried or fearful about seeking treatment, ask for support.  Finances  Not all insurance plans cover mental health care, so it is important to check with your insurance carrier. If paying for co-pays or counseling services is a problem, search for a local or county mental health care center. Public mental health care services may be offered there at a low cost or no cost when you are not able to see a private health care provider. If you are a veteran, contact a local veterans organization or veterans hospital for more information.  If you are taking medicine for PTSD, you may be able to get the genericform, which may be less expensive than brand-name medicine. Some makers of prescription medicines also offer help to patients who cannot afford the medicines that they need.  Community resources   Find a support group in your community. Often, groups are available for military veterans, trauma victims, and family members or caregivers.   Look into volunteer opportunities. Taking part in these can help you feel more connected to your community.   Contact a local organization to find out if you are eligible for a service dog.   Keep daily contact with at least one trusted friend or family member.  Follow these instructions at home:  Lifestyle   Exercise regularly. Try to do 30 or more minutes of  physical activity on most days of the week.   Try to get 7-9 hours of sleep each night. To help with sleep:  ? Keep your bedroom cool and dark.  ? Do not eat a heavy meal during the hour before you go to bed.  ? Do not drink alcohol or caffeinated drinks before bed.  ? Avoid screen time before bedtime. This means avoiding use of your TV, computer, tablet, and cell phone.   Avoid using alcohol or drugs.   Practice self-soothing skills and use them daily.   Try to have fun and seek humor in your life.  General instructions   If your PTSD is affecting your marriage or family, seek help from a family therapist.   Take over-the-counter and prescription medicines only as told by your health care provider.   Make sure to let all of your health care providers   know that you have PTSD. This is especially important if you are having surgery or need to be admitted to the hospital.   Keep all follow-up visits as told by your health care providers. This is important.  Where to find more information  Go to this website to find more information about PTSD, treatment for PTSD, and how to get support:   National Center for PTSD: www.ptsd.va.gov  Contact a health care provider if:   Your symptoms get worse or they do not get better.  Get help right away if:   You have thoughts about hurting yourself or others.  If you ever feel like you may hurt yourself or others, or have thoughts about taking your own life, get help right away. You can go to your nearest emergency department or call:   Your local emergency services (911 in the [place]   A suicide crisis helpline, such as the National Suicide Prevention Lifeline at 1-800-273-8255. This is open 24-hours a day.  Summary   If you are living with PTSD, there are ways to help you recover from it and manage your symptoms.   Find supportive environments and people who understand PTSD. Spend time in those places, and maintain contact with those people.   Work with your health  care team to create a management plan that includes counseling, stress management techniques, and healthy lifestyle habits.  This information is not intended to replace advice given to you by your health care provider. Make sure you discuss any questions you have with your health care provider.  Document Released: 08/22/2016 Document Revised: 08/22/2016 Document Reviewed: 08/22/2016  Elsevier Interactive Patient Education  2019 [employer] Inc[employer]

## 2018-07-17 NOTE — Progress Notes (Signed)
HPI:                                                                Christopher [name] is a 48 y.o. male who presents to [place]: Christopher [name] today for anxiety follow-up  05/13/2018 States his PTSD is flaring up He feels more irritable and angry than usual. He is having sleep difficulties. Usual bedtime is 10 pm, waking up prematurely at 2 am, sometimes can't go back to sleep. Reports he is wearing his CPAP nightly, usually for 6-7 hours Currently taking Doxepin 50 mg nightly, which is no longer helpful.  07/17/2018 Doxepin increased to 75 mg nightly and Prazosin 1 mg QHS was re-started 2 months ago He is feeling much better Sleep is restorative He hasn't been able to get into the [place] for counseling due to back-log, but no longer feels like he needs it  He was switched from Simvastatin to Atorvastatin. Denies myalgias or any adverse effects  Needs a refill of Metformin  Depression screen [facility] 2/9 07/17/2018 05/13/2018 07/14/2017 12/26/2016 08/08/2016  Decreased Interest 0 0 0 0 1  Down, Depressed, Hopeless 0 1 0 0 0  PHQ - 2 Score 0 1 0 0 1  Altered sleeping 0 2 - 0 0  Tired, decreased energy 0 0 - 0 0  Change in appetite 0 0 - 0 1  Feeling bad or failure about yourself  0 0 - 0 0  Trouble concentrating 0 1 - 1 1  Moving slowly or fidgety/restless 0 0 - 0 0  Suicidal thoughts 0 0 - 0 0  PHQ-9 Score 0 4 - 1 3  Difficult doing work/chores Not difficult at all - - - -    GAD 7 : Generalized Anxiety Score 07/17/2018 05/13/2018 06/28/2016  Nervous, Anxious, on Edge 0 2 0  Control/stop worrying 0 2 1  Worry too much - different things 1 2 1   Trouble relaxing 0 2 1  Restless 0 0 0  Easily annoyed or irritable 1 3 3   Afraid - awful might happen 0 2 1  Total GAD 7 Score 2 13 7   Anxiety Difficulty Not difficult at all - -      Past Medical History:  Diagnosis Date  . Depression   . Hyperlipidemia   . Hypogonadal obesity   . Lumbar degenerative  disc disease   . Obesity   . OSA (obstructive sleep apnea) 12/02/2013   CPAP therapy on 17 cm H2O with a Wide size Resmed Nasal Mask Mirage FX mask and heated humidification   . Post traumatic stress disorder (PTSD)   . Prediabetes 2018   Past Surgical History:  Procedure Laterality Date  . VASECTOMY  08/2017   Social History   Tobacco Use  . Smoking status: Never Smoker  . Smokeless tobacco: Never Used  Substance Use Topics  . Alcohol use: Yes   family history includes Prostate cancer in an other family member.    ROS: negative except as noted in the HPI  Medications: Current Outpatient Medications  Medication Sig Dispense Refill  . AMBULATORY NON FORMULARY MEDICATION CPAP therapy on 17 cm H2O with a Wide size Resmed Nasal Mask Mirage FX mask and heated humidification. Use nightly for sleep.  Dx: Obstructive Sleep Apnea 1 Units 0  . aspirin EC 81 MG tablet Take 1 tablet (81 mg total) by mouth daily. 90 tablet 3  . atorvastatin (LIPITOR) 10 MG tablet Take 1 tablet (10 mg total) by mouth daily. 90 tablet 3  . doxepin (SINEQUAN) 75 MG capsule Take 1 capsule (75 mg total) by mouth at bedtime. 90 capsule 1  . EPINEPHrine 0.3 mg/0.3 mL IJ SOAJ injection Inject into the muscle.     . meloxicam (MOBIC) 15 MG tablet One tab PO qAM with breakfast for 2 weeks, then daily prn pain. 30 tablet 3  . metFORMIN (GLUCOPHAGE) 500 MG tablet Take 1 tablet (500 mg total) by mouth daily with breakfast. 90 tablet 1  . prazosin (MINIPRESS) 1 MG capsule Take 1 capsule (1 mg total) by mouth at bedtime. 90 capsule 1  . sildenafil (REVATIO) 20 MG tablet Take 1-5 tablets (20-100 mg total) by mouth as needed. 30 tablet 2  . valsartan-hydrochlorothiazide (DIOVAN-HCT) 320-12.5 MG tablet Take 1 tablet by mouth daily. 90 tablet 1   No current facility-administered medications for this visit.    No Known Allergies     Objective:  BP 124/75   Pulse 87   Wt (!) 305 lb (138.3 kg)   BMI 46.38 kg/m  Gen:   alert, not ill-appearing, no distress, appropriate for age Psych: well-groomed, cooperative, good eye contact, euthymic mood, affect mood-congruent, speech is articulate, and thought processes clear and goal-directed    No results found for this or any previous visit (from the past 72 hour(s)). No results found.    Assessment and Plan: 48 y.o. male with   .Christopher [name] was seen today for follow-up.  Diagnoses and all orders for this visit:  Chronic post-traumatic stress disorder (PTSD)  Controlled type 2 diabetes mellitus without complication, without long-term current use of insulin (HCC) -     metFORMIN (GLUCOPHAGE) 500 MG tablet; Take 1 tablet (500 mg total) by mouth daily with breakfast.   Chronic PTSD PHQ9=0 GAD7=2 Significantly improved Cont Prazosin 1 mg QHS and Doxepin 75 mg QHS    Patient education and anticipatory guidance given Patient agrees with treatment plan Follow-up in 4 months for diabetes or sooner as needed if symptoms worsen or fail to improve  Darlyne Russian PA-C

## 2018-08-09 DIAGNOSIS — G4733 Obstructive sleep apnea (adult) (pediatric): Secondary | ICD-10-CM | POA: Diagnosis not present

## 2018-08-13 ENCOUNTER — Encounter: Payer: Self-pay | Admitting: Physician Assistant

## 2018-08-13 ENCOUNTER — Ambulatory Visit (INDEPENDENT_AMBULATORY_CARE_PROVIDER_SITE_OTHER): Payer: BLUE CROSS/BLUE SHIELD | Admitting: Physician Assistant

## 2018-08-13 DIAGNOSIS — Z9989 Dependence on other enabling machines and devices: Secondary | ICD-10-CM

## 2018-08-13 DIAGNOSIS — G4733 Obstructive sleep apnea (adult) (pediatric): Secondary | ICD-10-CM | POA: Diagnosis not present

## 2018-08-13 NOTE — Progress Notes (Signed)
Virtual Visit via Video Note  I connected with Christopher [name] on 08/13/18 at  1:20 PM EDT by a video enabled telemedicine application and verified that I am speaking with the correct person using two identifiers.   I discussed the limitations of evaluation and management by telemedicine and the availability of in person appointments. The patient expressed understanding and agreed to proceed.  History of Present Illness: Christopher [name] presents today for 31-day sleep apnea follow-up Reports he is using his CPAP nightly for approx 8 hours per night Denies any issues. Reports able to fall asleep and sleep is restorative. Denies excessive daytime sleepiness. Reports he is pleased with his new machine  Last sleep study 02/02/2015 showing AHI of 10.1  Past Medical History:  Diagnosis Date  . Depression   . Hyperlipidemia   . Hypogonadal obesity   . Lumbar degenerative disc disease   . Obesity   . OSA (obstructive sleep apnea) 12/02/2013   CPAP therapy on 17 cm H2O with a Wide size Resmed Nasal Mask Mirage FX mask and heated humidification   . Post traumatic stress disorder (PTSD)   . Prediabetes 2018   Past Surgical History:  Procedure Laterality Date  . VASECTOMY  08/2017   Social History   Tobacco Use  . Smoking status: Never Smoker  . Smokeless tobacco: Never Used  Substance Use Topics  . Alcohol use: Yes   family history includes Prostate cancer in an other family member.    ROS: negative except as noted in the HPI  Medications: Current Outpatient Medications  Medication Sig Dispense Refill  . AMBULATORY NON FORMULARY MEDICATION CPAP therapy on 17 cm H2O with a Wide size Resmed Nasal Mask Mirage FX mask and heated humidification. Use nightly for sleep.  Dx: Obstructive Sleep Apnea 1 Units 0  . aspirin EC 81 MG tablet Take 1 tablet (81 mg total) by mouth daily. 90 tablet 3  . atorvastatin (LIPITOR) 10 MG tablet Take 1 tablet (10 mg total) by mouth daily. 90 tablet 3  . doxepin  (SINEQUAN) 75 MG capsule Take 1 capsule (75 mg total) by mouth at bedtime. 90 capsule 1  . EPINEPHrine 0.3 mg/0.3 mL IJ SOAJ injection Inject into the muscle.     . meloxicam (MOBIC) 15 MG tablet One tab PO qAM with breakfast for 2 weeks, then daily prn pain. 30 tablet 3  . metFORMIN (GLUCOPHAGE) 500 MG tablet Take 1 tablet (500 mg total) by mouth daily with breakfast. 90 tablet 1  . prazosin (MINIPRESS) 1 MG capsule Take 1 capsule (1 mg total) by mouth at bedtime. 90 capsule 1  . sildenafil (REVATIO) 20 MG tablet Take 1-5 tablets (20-100 mg total) by mouth as needed. 30 tablet 2  . valsartan-hydrochlorothiazide (DIOVAN-HCT) 320-12.5 MG tablet Take 1 tablet by mouth daily. 90 tablet 1   No current facility-administered medications for this visit.    No Known Allergies     Objective:  There were no vitals taken for this visit. Pulm: Normal work of breathing, normal phonation, speaking in full sentences   No results found for this or any previous visit (from the past 72 hour(s)). No results found.    Assessment and Plan: 48 y.o. male with  .Diagnoses and all orders for this visit:  OSA (obstructive sleep apnea)   Compliant with nightly CPAP use Encouraged to continue using CPAP as much as possible Contacting [facility] for CPAP download   Follow Up Instructions:    I discussed the assessment and  treatment plan with the patient. The patient was provided an opportunity to ask questions and all were answered. The patient agreed with the plan and demonstrated an understanding of the instructions.   The patient was advised to call back or seek an in-person evaluation if the symptoms worsen or if the condition fails to improve as anticipated.  I provided 10 minutes of non-face-to-face time during this encounter.   Trixie Dredge, [place]

## 2018-08-18 ENCOUNTER — Telehealth: Payer: Self-pay | Admitting: Physician Assistant

## 2018-08-18 NOTE — Telephone Encounter (Signed)
Faxed to [employer] [facility].

## 2018-08-18 NOTE — Telephone Encounter (Signed)
Please fax last office note dated 08/13/18 to [place]

## 2018-09-08 DIAGNOSIS — G4733 Obstructive sleep apnea (adult) (pediatric): Secondary | ICD-10-CM | POA: Diagnosis not present

## 2018-10-09 DIAGNOSIS — G4733 Obstructive sleep apnea (adult) (pediatric): Secondary | ICD-10-CM | POA: Diagnosis not present

## 2018-10-22 ENCOUNTER — Ambulatory Visit (INDEPENDENT_AMBULATORY_CARE_PROVIDER_SITE_OTHER): Payer: BC Managed Care – PPO

## 2018-10-22 ENCOUNTER — Other Ambulatory Visit: Payer: Self-pay

## 2018-10-22 ENCOUNTER — Encounter: Payer: Self-pay | Admitting: Physician Assistant

## 2018-10-22 ENCOUNTER — Ambulatory Visit (INDEPENDENT_AMBULATORY_CARE_PROVIDER_SITE_OTHER): Payer: BC Managed Care – PPO | Admitting: Physician Assistant

## 2018-10-22 VITALS — BP 122/80 | HR 71 | Temp 98.3°F | Wt 310.0 lb

## 2018-10-22 DIAGNOSIS — R269 Unspecified abnormalities of gait and mobility: Secondary | ICD-10-CM | POA: Diagnosis not present

## 2018-10-22 DIAGNOSIS — M25552 Pain in left hip: Secondary | ICD-10-CM | POA: Insufficient documentation

## 2018-10-22 DIAGNOSIS — R29898 Other symptoms and signs involving the musculoskeletal system: Secondary | ICD-10-CM

## 2018-10-22 DIAGNOSIS — E782 Mixed hyperlipidemia: Secondary | ICD-10-CM

## 2018-10-22 DIAGNOSIS — M160 Bilateral primary osteoarthritis of hip: Secondary | ICD-10-CM | POA: Diagnosis not present

## 2018-10-22 MED ORDER — ATORVASTATIN CALCIUM 10 MG PO TABS: 10.0000 mg | ORAL_TABLET | Freq: Every day | ORAL | 3 refills | Status: DC

## 2018-10-22 NOTE — Patient Instructions (Signed)

## 2018-10-22 NOTE — Progress Notes (Signed)
HPI:                                                                Christopher [name] is a 48 y.o. male who presents to [facility]: [facility] today for left hip pain  Patient describes left lateral hip pain in the region of his greater trochanter radiating into his left thigh.  Insidious onset approximately 2 to 3 months ago, gradually worsening over the last month.  He describes a sharp stabbing pain that begins after prolonged walking.  States that he has been increasing his physical activity and walking/jogging at the track approximately 3 days a week doing approximately 6 laps.  He states about 2 laps into his exercise he begins to have moderate to severe pain that causes him to limp.  He states he has pain with position changes such as getting out of his bed or getting out of his car.  He also describes pain with abduction at times. When pain is severe, his leg feels weak and he has to use his hands to lift it. No known injury or trauma. No associated back pain or knee pain. Has not tried any treatments.   Past Medical History:  Diagnosis Date  . Depression   . Hyperlipidemia   . Hypogonadal obesity   . Lumbar degenerative disc disease   . Obesity   . OSA (obstructive sleep apnea) 12/02/2013   CPAP therapy on 17 cm H2O with a Wide size Resmed Nasal Mask Mirage FX mask and heated humidification   . Post traumatic stress disorder (PTSD)   . Prediabetes 2018   Past Surgical History:  Procedure Laterality Date  . VASECTOMY  08/2017   Social History   Tobacco Use  . Smoking status: Never Smoker  . Smokeless tobacco: Never Used  Substance Use Topics  . Alcohol use: Yes   family history includes Prostate cancer in an other family member.    ROS: negative except as noted in the HPI  Medications: Current Outpatient Medications  Medication Sig Dispense Refill  . AMBULATORY NON FORMULARY MEDICATION CPAP therapy on 17 cm H2O with a Wide size  Resmed Nasal Mask Mirage FX mask and heated humidification. Use nightly for sleep.  Dx: Obstructive Sleep Apnea 1 Units 0  . aspirin EC 81 MG tablet Take 1 tablet (81 mg total) by mouth daily. 90 tablet 3  . atorvastatin (LIPITOR) 10 MG tablet Take 1 tablet (10 mg total) by mouth daily. 90 tablet 3  . doxepin (SINEQUAN) 75 MG capsule Take 1 capsule (75 mg total) by mouth at bedtime. 90 capsule 1  . EPINEPHrine 0.3 mg/0.3 mL IJ SOAJ injection Inject into the muscle.     . meloxicam (MOBIC) 15 MG tablet One tab PO qAM with breakfast for 2 weeks, then daily prn pain. 30 tablet 3  . metFORMIN (GLUCOPHAGE) 500 MG tablet Take 1 tablet (500 mg total) by mouth daily with breakfast. 90 tablet 1  . prazosin (MINIPRESS) 1 MG capsule Take 1 capsule (1 mg total) by mouth at bedtime. 90 capsule 1  . sildenafil (REVATIO) 20 MG tablet Take 1-5 tablets (20-100 mg total) by mouth as needed. 30 tablet 2  . valsartan-hydrochlorothiazide (DIOVAN-HCT) 320-12.5 MG tablet Take 1  tablet by mouth daily. 90 tablet 1   No current facility-administered medications for this visit.    No Known Allergies     Objective:  BP 122/80   Pulse 71   Temp 98.3 F (36.8 C) (Oral)   Wt (!) 310 lb (140.6 kg)   BMI 47.14 kg/m  Gen:  alert, not ill-appearing, no distress, appropriate for age 63: head normocephalic without obvious abnormality, conjunctiva and cornea clear, trachea midline Pulm: Normal work of breathing, normal phonation Neuro: alert and oriented x 3, no tremor MSK: extremities atraumatic, normal gait and station, no peripheral edema Left Hip: atraumatic, decreased ROM with internal rotation and terminal flexion, strength intact, no pain overlying the greater trochanter, negative Obers, no pain at the insertion of the IT band Skin: intact, no rashes on exposed skin, no jaundice, no cyanosis   No results found for this or any previous visit (from the past 72 hour(s)). No results found.    Assessment and  Plan: 48 y.o. male with   .Christopher [name] was seen today for hip pain.  Diagnoses and all orders for this visit:  Lateral pain of left hip -     Cancel: XR HIP UNILAT W OR W/O PELVIS 1V LEFT -     Ambulatory referral to Physical Therapy -     DG HIP UNILAT WITH PELVIS 2-3 VIEWS LEFT  Mixed hyperlipidemia -     atorvastatin (LIPITOR) 10 MG tablet; Take 1 tablet (10 mg total) by mouth daily.  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

## 2018-10-29 ENCOUNTER — Ambulatory Visit: Payer: BC Managed Care – PPO | Admitting: Physical Therapy

## 2018-10-29 ENCOUNTER — Encounter: Payer: Self-pay | Admitting: Physical Therapy

## 2018-10-29 ENCOUNTER — Other Ambulatory Visit: Payer: Self-pay

## 2018-10-29 DIAGNOSIS — M25652 Stiffness of left hip, not elsewhere classified: Secondary | ICD-10-CM

## 2018-10-29 DIAGNOSIS — M6281 Muscle weakness (generalized): Secondary | ICD-10-CM

## 2018-10-29 DIAGNOSIS — M25552 Pain in left hip: Secondary | ICD-10-CM | POA: Diagnosis not present

## 2018-10-29 NOTE — Therapy (Signed)
[place], [place], [address] Phone: [phone]   Fax:  [phone]  Physical Therapy Evaluation  Patient Details  Name: Christopher [name] MRN: [id number] Date of Birth: 1970/09/30 Referring Provider (PT): Trixie Dredge, [place]   Encounter Date: 10/29/2018  PT End of Session - 10/29/18 1026    Visit Number  1    Number of Visits  12    Date for PT Re-Evaluation  12/10/18    PT Start Time  0922    PT Stop Time  1008    PT Time Calculation (min)  46 min    Activity Tolerance  Patient tolerated treatment well    Behavior During Therapy  [facility] for tasks assessed/performed       Past Medical History:  Diagnosis Date  . Depression   . Hyperlipidemia   . Hypogonadal obesity   . Lumbar degenerative disc disease   . Obesity   . OSA (obstructive sleep apnea) 12/02/2013   CPAP therapy on 17 cm H2O with a Wide size Resmed Nasal Mask [place] FX mask and heated humidification   . Post traumatic stress disorder (PTSD)   . Prediabetes 2018    Past Surgical History:  Procedure Laterality Date  . VASECTOMY  08/2017    There were no vitals filed for this visit.   Subjective Assessment - 10/29/18 0923    Subjective  Pt is a 48 y/o male who presents to OPPT for Lt hip pain which began "a few months" ago.  Pt without known cause of injury, and reports pain begins after running ~ 1/2 mile on track.  Pt then reports needing to walk and limping from there.  Pt states running is new activity.  Pain also increased with getting into/out of car.    Pertinent History  obesity    Limitations  Walking    How long can you walk comfortably?  20-25 min    Diagnostic tests  xrays: mild arthritis bil    Patient Stated Goals  improve pain    Currently in Pain?  Yes    Pain Score  0-No pain   up to 6/10   Pain Location  Hip    Pain Orientation  Left;Posterior;Lateral    Pain Descriptors / Indicators   Aching;Sharp;Stabbing    Pain Type  Acute pain    Pain Onset  More than a month ago    Pain Frequency  Intermittent    Aggravating Factors   running, lifting leg up (hip flexion), bending forward in sitting    Pain Relieving Factors  icy hot, ice, longer time to decrease pain         [facility] PT Assessment - 10/29/18 0929      Assessment   Medical Diagnosis  M25.552 (ICD-10-CM) - Lateral pain of left hip    Referring Provider (PT)  Trixie Dredge, PA-C    Onset Date/Surgical Date  --   few months ago   Hand Dominance  Right    Next MD Visit  11/12/2018    Prior Therapy  at this clinic in 2017-back pain      Precautions   Precautions  None      Restrictions   Weight Bearing Restrictions  No      Balance Screen   Has the patient fallen in the past 6 months  No    Has the patient had a decrease in activity level because of a fear of  falling?   No    Is the patient reluctant to leave their home because of a fear of falling?   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     Leisure  relax, video games, ride bikes; walking daily      Cognition   Overall Cognitive Status  Within Functional Limits for tasks assessed      Observation/Other Assessments   Focus on Therapeutic Outcomes (FOTO)   72 (28% limited; predicted 20% limited)      Posture/Postural Control   Posture/Postural Control  Postural limitations    Postural Limitations  Rounded Shoulders;Forward head      ROM / Strength   AROM / PROM / Strength  AROM;Strength      AROM   Overall AROM Comments  Lt hip flexion and internal rotation limited ~ 25%; not formally measured      Strength   Strength Assessment Site  Hip;Knee     Right/Left Hip  Right;Left    Right Hip Flexion  5/5    Right Hip Extension  4/5    Right Hip ABduction  5/5    Left Hip Flexion  3+/5    Left Hip Extension  3/5    Left Hip ABduction  3+/5    Right/Left Knee  Right;Left    Right Knee Flexion  5/5    Right Knee Extension  5/5    Left Knee Flexion  5/5    Left Knee Extension  5/5      Flexibility   Soft Tissue Assessment /Muscle Length  yes    Hamstrings  tightness Lt>Rt    Piriformis  tightness Lt>Rt      Palpation   Spinal mobility  lumbar spine WNL    Palpation comment  no significant ternderness or trigger points noted with Lt lateral and posterior hip      Special Tests    Special Tests  Lumbar;Hip Special Tests    Lumbar Tests  FABER test;Slump Test      FABER test   findings  Positive    Side  LEft      Slump test   Findings  Negative                Objective measurements completed on examination: See above findings.      [place] Adult PT Treatment/Exercise - 10/29/18 0929      Exercises   Exercises  Knee/Hip      Knee/Hip Exercises: Stretches   Passive Hamstring Stretch  Both;2 reps;30 seconds    Passive Hamstring Stretch Limitations  seated    Piriformis Stretch  Both;2 reps;30 seconds    Piriformis Stretch Limitations  seated - difficult; provided supine as well      Knee/Hip Exercises: Seated   Marching  Both;10 reps    Marching Limitations  red theraband      Knee/Hip Exercises: Supine   Bridges  Both;10 reps             PT Education - 10/29/18 1025    Education Details  HEP, TENS    Person(s) Educated  Patient    Methods  Explanation;Demonstration;Handout  Comprehension  Verbalized understanding;Returned demonstration          PT Long Term Goals - 10/29/18 1034      PT LONG TERM GOAL #1   Title  independent with HEP    Status  New    Target Date  12/10/18      PT LONG TERM GOAL #2   Title  report ability to walk > 40 min without increase in pain for improved  function    Status  New    Target Date  12/10/18      PT LONG TERM GOAL #3   Title  demonstrate at least 4/5 Lt hip strength for improved function and mobility    Status  New    Target Date  12/10/18      PT LONG TERM GOAL #4   Title  FOTO score improved to </= 20% limited for improved function    Status  New    Target Date  12/10/18      PT LONG TERM GOAL #5   Title  n/a             Plan - 10/29/18 1026    Clinical Impression Statement  Pt is a 48 y/o male who presents to OPPT with ~ 3 month hx of Lt hip pain without known onset.  Pt demonstrates decreased flexibility and ROM, decreased strength and difficulty with walking and ADLS/work responsibilities. Pt will benefit from PT to address deficits listed.    Personal Factors and Comorbidities  Comorbidity 3+    Comorbidities  sleep apnea, depression , PTSD, DM    Examination-Activity Limitations  Dressing;Locomotion Level;Stairs;Bend    Examination-Participation Restrictions  Other;Driving   work   [employer] officer  Evolving/Moderate complexity    Clinical Decision Making  Moderate    Rehab Potential  Good    PT Frequency  2x / week   1-2x/wk depending on pt's work schedule   PT Duration  6 weeks    PT Treatment/Interventions  ADLs/Self Care Home Management;Cryotherapy;Electrical Stimulation;Iontophoresis 4mg /ml Dexamethasone;Moist Heat;Therapeutic exercise;Therapeutic activities;Functional mobility training;Stair training;Gait training;Ultrasound;Patient/family education;Manual techniques;Taping;Dry needling;Passive range of motion    PT Next Visit Plan  review HEP, progress flexibility and strengthening, manual (LAD), modalities PRN    PT Home Exercise Plan  Access Code: [email or web address]    Consulted and Agree with Plan of Care  Patient       Patient will benefit from skilled therapeutic intervention in order to improve the following deficits and impairments:  Pain, Obesity, Increased fascial restricitons,  Decreased strength, Difficulty walking, Decreased mobility, Impaired flexibility, Hypomobility, Decreased range of motion  Visit Diagnosis: 1. Pain in left hip   2. Stiffness of left hip, not elsewhere classified   3. Muscle weakness (generalized)        Problem List Patient Active Problem List   Diagnosis Date Noted  . Lateral pain of left hip 10/22/2018  . Peripheral edema 09/16/2017  . Class 3 severe obesity due to excess calories with serious comorbidity in adult ([place]) 06/15/2017  . Controlled type 2 diabetes mellitus without complication, without long-term current use of insulin ([place]) 06/15/2017  . Encounter for medication monitoring 06/02/2017  . 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 Hypertension associated with diabetes ([place]) 01/10/2017  . Elevated hematocrit 11/24/2016  . Elevated blood pressure reading 08/11/2016  . Hypogonadism in male 06/01/2016  . Abnormal prostate specific antigen (PSA) 06/01/2016  . Moderate episode of recurrent major depressive disorder ([place]) 05/30/2016  . Chronic post-traumatic  stress disorder (PTSD) 05/30/2016  . Nightmares associated with chronic post-traumatic stress disorder 05/30/2016  . Lumbar degenerative disc disease 02/05/2016  . Insomnia secondary to anxiety 12/22/2015  . OSA (obstructive sleep apnea) 12/02/2013  . Severe obesity (BMI >= 40) ([place]) 12/02/2013  . Family history of prostate cancer 12/02/2013  . Hyperlipidemia       Laureen Abrahams, PT, DPT 10/29/18 10:42 AM      [place], [place], [address] Phone: [phone]   Fax:  [phone]  Name: Christopher [name] MRN: [id number] Date of Birth: May 31, 1970

## 2018-10-29 NOTE — Patient Instructions (Signed)
Access Code: [email or web address]  URL: [email or web address]  Date: 10/29/2018  Prepared by: Faustino Congress   Exercises  Seated Hamstring Stretch - 3 reps - 1 sets - 30 sec hold - 2x daily - 7x weekly  Seated Figure 4 Piriformis Stretch - 3 reps - 1 sets - 30 sec hold - 2x daily - 7x weekly  Supine Piriformis Stretch with Towel - 3 reps - 1 sets - 30 sec hold - 2x daily - 7x weekly  Seated March - 10 reps - 1 sets - 1-2 sec hold - 2x daily - 7x weekly  Supine Bridge - 10 reps - 1 sets - 5 sec hold - 2x daily - 7x weekly  Patient Education  TENS Unit

## 2018-11-05 ENCOUNTER — Other Ambulatory Visit: Payer: Self-pay

## 2018-11-05 ENCOUNTER — Encounter: Payer: Self-pay | Admitting: Physical Therapy

## 2018-11-05 ENCOUNTER — Ambulatory Visit: Payer: BC Managed Care – PPO | Admitting: Physical Therapy

## 2018-11-05 DIAGNOSIS — M6281 Muscle weakness (generalized): Secondary | ICD-10-CM | POA: Diagnosis not present

## 2018-11-05 DIAGNOSIS — M25652 Stiffness of left hip, not elsewhere classified: Secondary | ICD-10-CM | POA: Diagnosis not present

## 2018-11-05 DIAGNOSIS — M25552 Pain in left hip: Secondary | ICD-10-CM | POA: Diagnosis not present

## 2018-11-05 NOTE — Therapy (Signed)
[place], [place], [address] Phone: [phone]   Fax:  [phone]  Physical Therapy Treatment  Patient Details  Name: Christopher [name] MRN: [id number] Date of Birth: [birth year] Referring Provider (PT): Trixie Dredge, [place]   Encounter Date: 11/05/2018  PT End of Session - 11/05/18 1215    Visit Number  2    Number of Visits  12    Date for PT Re-Evaluation  12/10/18    PT Start Time  4627    PT Stop Time  1218    PT Time Calculation (min)  43 min    Activity Tolerance  Patient tolerated treatment well    Behavior During Therapy  [facility] for tasks assessed/performed       Past Medical History:  Diagnosis Date  . Depression   . Hyperlipidemia   . Hypogonadal obesity   . Lumbar degenerative disc disease   . Obesity   . OSA (obstructive sleep apnea) 12/02/2013   CPAP therapy on 17 cm H2O with a Wide size Resmed Nasal Mask [place] FX mask and heated humidification   . Post traumatic stress disorder (PTSD)   . Prediabetes 2018    Past Surgical History:  Procedure Laterality Date  . VASECTOMY  08/2017    There were no vitals filed for this visit.  Subjective Assessment - 11/05/18 1134    Subjective  did pretty well yesterday at work, had a little discomfort going up stairs.  stiffness after sitting for a longer period at work.    Pertinent History  obesity    Limitations  Walking    How long can you walk comfortably?  20-25 min    Diagnostic tests  xrays: mild arthritis bil    Patient Stated Goals  improve pain    Pain Score  0-No pain    Pain Onset  More than a month ago                       [facility] Adult PT Treatment/Exercise - 11/05/18 1136      Knee/Hip Exercises: Stretches   Passive Hamstring Stretch  Both;3 reps;30 seconds    Passive Hamstring Stretch Limitations  seated    Piriformis Stretch  Both;3 reps;30 seconds      Knee/Hip Exercises: Aerobic   Nustep  L5 x 6 min; LEs only      Knee/Hip Exercises: Seated   Marching  Both;10 reps    Marching Limitations  green theraband    Sit to [employer]  10 reps;without UE support   LLE weight bearing focus     Knee/Hip Exercises: Supine   Bridges  Both;10 reps      Knee/Hip Exercises: Sidelying   Hip ABduction  Left;10 reps    Hip ABduction Limitations  mod cues for technique             PT Education - 11/05/18 1215    Education Details  HEP    Person(s) Educated  Patient    Methods  Explanation;Demonstration;Handout    Comprehension  Verbalized understanding          PT Long Term Goals - 10/29/18 1034      PT LONG TERM GOAL #1   Title  independent with HEP    Status  New    Target Date  12/10/18      PT LONG TERM GOAL #2   Title  report ability to walk > 40  min without increase in pain for improved function    Status  New    Target Date  12/10/18      PT LONG TERM GOAL #3   Title  demonstrate at least 4/5 Lt hip strength for improved function and mobility    Status  New    Target Date  12/10/18      PT LONG TERM GOAL #4   Title  FOTO score improved to </= 20% limited for improved function    Status  New    Target Date  12/10/18      PT LONG TERM GOAL #5   Title  n/a            Plan - 11/05/18 1220    Clinical Impression Statement  Pt has demonstrated independence with initial HEP and able to add sidelying hip abduction today.  Plan to progress to weightbearing exercises next visit.  Overall pain very minimal and doing well only 2 visits in.    Personal Factors and Comorbidities  Comorbidity 3+    Comorbidities  sleep apnea, depression , PTSD, DM    Examination-Activity Limitations  Dressing;Locomotion Level;Stairs;Bend    Examination-Participation Restrictions  Other;Driving   work   [employer] officer  Evolving/Moderate complexity    Rehab Potential  Good    PT Frequency  2x / week   1-2x/wk depending on pt's work schedule   PT  Duration  6 weeks    PT Treatment/Interventions  ADLs/Self Care Home Management;Cryotherapy;Electrical Stimulation;Iontophoresis 4mg /ml Dexamethasone;Moist Heat;Therapeutic exercise;Therapeutic activities;Functional mobility training;Stair training;Gait training;Ultrasound;Patient/family education;Manual techniques;Taping;Dry needling;Passive range of motion    PT Next Visit Plan  progress flexibility and strengthening, manual (LAD), modalities PRN    PT Home Exercise Plan  Access Code: [phone]    Consulted and Agree with Plan of Care  Patient       Patient will benefit from skilled therapeutic intervention in order to improve the following deficits and impairments:  Pain, Obesity, Increased fascial restricitons, Decreased strength, Difficulty walking, Decreased mobility, Impaired flexibility, Hypomobility, Decreased range of motion  Visit Diagnosis: 1. Pain in left hip   2. Stiffness of left hip, not elsewhere classified   3. Muscle weakness (generalized)        Problem List Patient Active Problem List   Diagnosis Date Noted  . Lateral pain of left hip 10/22/2018  . Peripheral edema 09/16/2017  . Class 3 severe obesity due to excess calories with serious comorbidity in adult ([place]) 06/15/2017  . Controlled type 2 diabetes mellitus without complication, without long-term current use of insulin ([place]) 06/15/2017  . Encounter for medication monitoring 06/02/2017  . 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 Hypertension associated with diabetes ([place]) 01/10/2017  . Elevated hematocrit 11/24/2016  . Elevated blood pressure reading 08/11/2016  . Hypogonadism in male 06/01/2016  . Abnormal prostate specific antigen (PSA) 06/01/2016  . Moderate episode of recurrent major depressive disorder ([place]) 05/30/2016  . Chronic post-traumatic stress disorder (PTSD) 05/30/2016  . Nightmares associated with chronic post-traumatic stress disorder 05/30/2016  . Lumbar degenerative disc disease  02/05/2016  . Insomnia secondary to anxiety 12/22/2015  . OSA (obstructive sleep apnea) 12/02/2013  . Severe obesity (BMI >= 40) ([place]) 12/02/2013  . Family history of prostate cancer 12/02/2013  . Hyperlipidemia       Laureen Abrahams, PT, DPT 11/05/18 12:21 PM     [facility] [place], [place], [address] Phone: [phone]   Fax:  [phone]  Name: ALTER MOSS MRN: [id number] Date of Birth: 07-10-1970

## 2018-11-05 NOTE — Patient Instructions (Signed)
Access Code: [phone]  URL: [email or web address]  Date: 11/05/2018  Prepared by: Faustino Congress   Exercises  Seated Hamstring Stretch - 3 reps - 1 sets - 30 sec hold - 2x daily - 7x weekly  Seated Figure 4 Piriformis Stretch - 3 reps - 1 sets - 30 sec hold - 2x daily - 7x weekly  Supine Piriformis Stretch with Towel - 3 reps - 1 sets - 30 sec hold - 2x daily - 7x weekly  Seated March - 10 reps - 1 sets - 1-2 sec hold - 2x daily - 7x weekly  Supine Bridge - 10 reps - 1 sets - 5 sec hold - 2x daily - 7x weekly  Sidelying Hip Abduction - 1 sets - 10 reps - 2x daily - 7x weekly  Patient Education  TENS Unit

## 2018-11-12 ENCOUNTER — Encounter: Payer: Self-pay | Admitting: Physician Assistant

## 2018-11-12 ENCOUNTER — Ambulatory Visit (INDEPENDENT_AMBULATORY_CARE_PROVIDER_SITE_OTHER): Payer: BLUE CROSS/BLUE SHIELD | Admitting: Physician Assistant

## 2018-11-12 ENCOUNTER — Other Ambulatory Visit: Payer: Self-pay

## 2018-11-12 ENCOUNTER — Ambulatory Visit: Payer: BC Managed Care – PPO | Admitting: Physical Therapy

## 2018-11-12 VITALS — BP 121/84 | HR 73 | Temp 98.4°F | Wt 311.0 lb

## 2018-11-12 DIAGNOSIS — M25552 Pain in left hip: Secondary | ICD-10-CM

## 2018-11-12 DIAGNOSIS — E119 Type 2 diabetes mellitus without complications: Secondary | ICD-10-CM

## 2018-11-12 DIAGNOSIS — M6281 Muscle weakness (generalized): Secondary | ICD-10-CM

## 2018-11-12 DIAGNOSIS — M160 Bilateral primary osteoarthritis of hip: Secondary | ICD-10-CM | POA: Insufficient documentation

## 2018-11-12 DIAGNOSIS — G4733 Obstructive sleep apnea (adult) (pediatric): Secondary | ICD-10-CM

## 2018-11-12 DIAGNOSIS — Z9989 Dependence on other enabling machines and devices: Secondary | ICD-10-CM

## 2018-11-12 DIAGNOSIS — E1159 Type 2 diabetes mellitus with other circulatory complications: Secondary | ICD-10-CM

## 2018-11-12 DIAGNOSIS — I152 Hypertension secondary to endocrine disorders: Secondary | ICD-10-CM

## 2018-11-12 DIAGNOSIS — M25652 Stiffness of left hip, not elsewhere classified: Secondary | ICD-10-CM

## 2018-11-12 DIAGNOSIS — I10 Essential (primary) hypertension: Secondary | ICD-10-CM

## 2018-11-12 LAB — POCT GLYCOSYLATED HEMOGLOBIN (HGB A1C): HbA1c, POC (prediabetic range): 6 % (ref 5.7–6.4)

## 2018-11-12 MED ORDER — EPINEPHRINE 0.3 MG/0.3ML IJ SOAJ: 0.3000 mg | INTRAMUSCULAR | 3 refills | Status: DC | PRN

## 2018-11-12 NOTE — Patient Instructions (Signed)
Seated Table Piriformis Stretch - 2-3 reps - 1 sets - 30 seconds hold - 2x daily - 7x weekly  Seated Hip Flexor Stretch - 2-3 reps - 1 sets - 30 seconds hold - 2x daily - 7x weekly

## 2018-11-12 NOTE — Progress Notes (Signed)
HPI:                                                                Christopher [name] is a 48 y.o. male who presents to [facility] today for diabetes/HTN/OSA follow-up  DMII:Currentlytakinglow-dose Metformin 500 mgdaily. Compliant with medications.  Denies polydipsia, polyuria, polyphagia. Denies blurred vision or vision change. Denies extremity pain, altered sensation and paresthesias. Denies ulcers/wounds on feet.   VPX:TGGYIR Valsartan-HCTZ daily.Compliant with medications.Does not check BP's at home. Denies vision change, headache, chest painwith exertion, orthopnea, lightheadedness,syncopeand edema. Risk factors include:DM2, obesity, male sex,HLD  OSA: Reports he is using his CPAP nightly for approx 8 hours per night Denies any issues. Reports able to fall asleep and sleep is restorative. Denies excessive daytime sleepiness. Reports he is pleased with his new machine Last sleep study 02/02/2015 showing AHI of 10.1  Left hip pain: Patient describes left lateral hip pain in the region of his greater trochanter radiating into his left thigh.  Insidious onset approximately 2 to 3 months ago, gradually worsening over the last month.  He describes a sharp stabbing pain that begins after prolonged walking.  States that he has been increasing his physical activity and walking/jogging at the track approximately 3 days a week doing approximately 6 laps.  He states about 2 laps into his exercise he begins to have moderate to severe pain that causes him to limp.  He states he has pain with position changes such as getting out of his bed or getting out of his car.  He also describes pain with abduction at times. When pain is severe, his leg feels weak and he has to use his hands to lift it. No known injury or trauma. No associated back pain or knee pain. Has not tried any treatments. Interval hx 11/12/18 X-ray shows b/l OA Has completed 2  physical therapy sessions and reports pain and weakness is gradually improving Has been compliant with home rehab exercises Pain is rated 1/10 today  Depression screen [facility] 2/9 11/12/2018 07/17/2018 05/13/2018 07/14/2017 12/26/2016  Decreased Interest 0 0 0 0 0  Down, Depressed, Hopeless 0 0 1 0 0  PHQ - 2 Score 0 0 1 0 0  Altered sleeping - 0 2 - 0  Tired, decreased energy - 0 0 - 0  Change in appetite - 0 0 - 0  Feeling bad or failure about yourself  - 0 0 - 0  Trouble concentrating - 0 1 - 1  Moving slowly or fidgety/restless - 0 0 - 0  Suicidal thoughts - 0 0 - 0  PHQ-9 Score - 0 4 - 1  Difficult doing work/chores - Not difficult at all - - -    GAD 7 : Generalized Anxiety Score 07/17/2018 05/13/2018 06/28/2016  Nervous, Anxious, on Edge 0 2 0  Control/stop worrying 0 2 1  Worry too much - different things 1 2 1   Trouble relaxing 0 2 1  Restless 0 0 0  Easily annoyed or irritable 1 3 3   Afraid - awful might happen 0 2 1  Total GAD 7 Score 2 13 7   Anxiety Difficulty Not difficult at all - -      Past Medical History:  Diagnosis  Date  . Depression   . Hyperlipidemia   . Hypogonadal obesity   . Lumbar degenerative disc disease   . Obesity   . OSA (obstructive sleep apnea) 12/02/2013   CPAP therapy on 17 cm H2O with a Wide size Resmed Nasal Mask Mirage FX mask and heated humidification   . Post traumatic stress disorder (PTSD)   . Prediabetes 2018   Past Surgical History:  Procedure Laterality Date  . VASECTOMY  08/2017   Social History   Tobacco Use  . Smoking status: Never Smoker  . Smokeless tobacco: Never Used  Substance Use Topics  . Alcohol use: Yes   family history includes Prostate cancer in an other family member.    ROS: negative except as noted in the HPI  Medications: Current Outpatient Medications  Medication Sig Dispense Refill  . AMBULATORY NON FORMULARY MEDICATION CPAP therapy on 17 cm H2O with a Wide size Resmed Nasal Mask Mirage FX mask and  heated humidification. Use nightly for sleep.  Dx: Obstructive Sleep Apnea 1 Units 0  . aspirin EC 81 MG tablet Take 1 tablet (81 mg total) by mouth daily. 90 tablet 3  . atorvastatin (LIPITOR) 10 MG tablet Take 1 tablet (10 mg total) by mouth daily. 90 tablet 3  . doxepin (SINEQUAN) 75 MG capsule Take 1 capsule (75 mg total) by mouth at bedtime. 90 capsule 1  . EPINEPHrine 0.3 mg/0.3 mL IJ SOAJ injection Inject into the muscle.     . meloxicam (MOBIC) 15 MG tablet One tab PO qAM with breakfast for 2 weeks, then daily prn pain. 30 tablet 3  . metFORMIN (GLUCOPHAGE) 500 MG tablet Take 1 tablet (500 mg total) by mouth daily with breakfast. 90 tablet 1  . prazosin (MINIPRESS) 1 MG capsule Take 1 capsule (1 mg total) by mouth at bedtime. 90 capsule 1  . sildenafil (REVATIO) 20 MG tablet Take 1-5 tablets (20-100 mg total) by mouth as needed. 30 tablet 2  . valsartan-hydrochlorothiazide (DIOVAN-HCT) 320-12.5 MG tablet Take 1 tablet by mouth daily. 90 tablet 1   No current facility-administered medications for this visit.    No Known Allergies     Objective:  BP 121/84   Pulse 73   Temp 98.4 F (36.9 C) (Oral)   Wt (!) 311 lb (141.1 kg)   SpO2 97%   BMI 47.29 kg/m  Gen:  alert, not ill-appearing, no distress, appropriate for age 61: head normocephalic without obvious abnormality, conjunctiva and cornea clear, trachea midline, no carotid bruit Pulm: Normal work of breathing, normal phonation, clear to auscultation bilaterally, no wheezes, rales or rhonchi CV: Normal rate, regular rhythm, s1 and s2 distinct, no murmurs, clicks or rubs  Neuro: alert and oriented x 3, no tremor MSK: extremities atraumatic, normal gait and station, no peripheral edema Skin: intact, no rashes on exposed skin, no jaundice, no cyanosis Psych: well-groomed, cooperative, good eye contact, euthymic mood, affect mood-congruent, speech is articulate, and thought processes clear and goal-directed  Diabetic Foot  Exam - Simple   Simple Foot Form Diabetic Foot exam was performed with the following findings: Yes 11/12/2018  1:16 PM  Visual Inspection Sensation Testing Pulse Check Comments    Lab Results  Component Value Date   CREATININE 1.04 05/13/2018   BUN 20 05/13/2018   NA 142 05/13/2018   K 4.4 05/13/2018   CL 105 05/13/2018   CO2 28 05/13/2018   Lab Results  Component Value Date   ALT 16 05/13/2018   AST  12 05/13/2018   ALKPHOS 80 05/30/2016   BILITOT 0.3 05/13/2018   Lab Results  Component Value Date   CHOL 174 05/13/2018   HDL 43 05/13/2018   LDLCALC 116 (H) 05/13/2018   TRIG 62 05/13/2018   CHOLHDL 4.0 05/13/2018     Results for orders placed or performed in visit on 11/12/18 (from the past 72 hour(s))  POCT HgB A1C     Status: None   Collection Time: 11/12/18  1:17 PM  Result Value Ref Range   Hemoglobin A1C     HbA1c POC (<> result, manual entry)     HbA1c, POC (prediabetic range) 6.0 5.7 - 6.4 %   HbA1c, POC (controlled diabetic range)     No results found.  The 10-year ASCVD risk score Mikey Bussing DC Brooke Bonito., et al., 2013) is: 13.9%   Values used to calculate the score:     Age: 43 years     Sex: Male     Is Non-Hispanic African American: Yes     Diabetic: Yes     Tobacco smoker: No     Systolic Blood Pressure: 875 mmHg     Is BP treated: Yes     HDL Cholesterol: 43 mg/dL     Total Cholesterol: 174 mg/dL    Assessment and Plan: 48 y.o. male with   .Terald was seen today for hyperglycemia.  Diagnoses and all orders for this visit:  Controlled type 2 diabetes mellitus without complication, without long-term current use of insulin (HCC) -     POCT HgB A1C  Hypertension associated with diabetes ([place])  OSA on CPAP   Diabetes - very well-controlled - continue Metformin - eye exam UTD, due 01/2019 - foot exam performed today and wnl - BP goal<130/80, cont ARB-thiazide - on statin, LDL goal <70, fasting lipids pending - immunizations UTD  HTN BP  nearly at goal Cont current medications 10 yr ASCVD risk 13.9%, cont baby asa and statin for primary prevention  OSA Compliant with CPAP nightly  Hip OA Cont PT weekly for 4-6 weeks  Patient education and anticipatory guidance given Patient agrees with treatment plan Follow-up in 3 months or sooner as needed if symptoms worsen or fail to improve  Darlyne Russian PA-C

## 2018-11-12 NOTE — Therapy (Signed)
[facility], [place], [address] Phone: [phone]   Fax:  [phone]  Physical Therapy Treatment  Patient Details  Name: Christopher [name] MRN: [id number] Date of Birth: 25-Aug-1970 Referring Provider (PT): Trixie Dredge, [place]   Encounter Date: 11/12/2018  PT End of Session - 11/12/18 1454    Visit Number  3    Number of Visits  12    Date for PT Re-Evaluation  12/10/18    PT Start Time  5638    PT Stop Time  1445    PT Time Calculation (min)  43 min    Activity Tolerance  Patient tolerated treatment well    Behavior During Therapy  [facility] for tasks assessed/performed       Past Medical History:  Diagnosis Date  . Depression   . Hyperlipidemia   . Hypogonadal obesity   . Lumbar degenerative disc disease   . Obesity   . OSA (obstructive sleep apnea) 12/02/2013   CPAP therapy on 17 cm H2O with a Wide size Resmed Nasal Mask Mirage FX mask and heated humidification   . Post traumatic stress disorder (PTSD)   . Prediabetes 2018    Past Surgical History:  Procedure Laterality Date  . VASECTOMY  08/2017    There were no vitals filed for this visit.  Subjective Assessment - 11/12/18 1405    Subjective  Pt reports he had some pain yesterday after being on his LE for 12 hrs (pain up to 5/10). He has been doing his exercises.    Diagnostic tests  xrays: mild arthritis bil    Patient Stated Goals  improve pain    Currently in Pain?  No/denies    Pain Score  0-No pain         [facility] PT Assessment - 11/12/18 0001      Strength   Right/Left Hip  Left    Left Hip Flexion  3+/5   with pain, difficult initiating   Left Hip Extension  4/5    Left Hip ABduction  4-/5        [facility] Adult PT Treatment/Exercise - 11/12/18 0001      Self-Care   Self-Care  Other Self-Care Comments    Other Self-Care Comments   pt educated in self massage with ball to Lt hip to decrease fascial  restrictions; pt verbalized udnerstanding and returned demo with cues.       Knee/Hip Exercises: Stretches   Passive Hamstring Stretch  Right;Left;2 reps;30 seconds   seated   Quad Stretch  Left;Right;2 reps;30 seconds    Hip Flexor Stretch  2 reps;Left;30 seconds    Piriformis Stretch  Left;Right;2 reps;30 seconds    Piriformis Stretch Limitations  additional rep in sitting with leg supported on table per HEP      Knee/Hip Exercises: Aerobic   Nustep  L4: 5.5 min LE only.       Knee/Hip Exercises: Standing   Hip Flexion  --   shown marching for HEP option   Hip Abduction  --   shown marching for HEP option   Hip Extension  --   shown marching for HEP option     Knee/Hip Exercises: Supine   Bridges  Both;10 reps   5 sec hold in ext   Straight Leg Raises  AAROM;Left;1 set;5 reps   (eccentric lowering)     Knee/Hip Exercises: Sidelying   Hip ABduction  Left;1 set;10 reps   cues for  technique     Knee/Hip Exercises: Prone   Hip Extension  Strengthening;Left;1 set;10 reps        PT Education - 11/12/18 1438    Education Details  HEP    Person(s) Educated  Patient    Methods  Explanation;Handout;Demonstration;Verbal cues    Comprehension  Verbalized understanding;Returned demonstration          PT Long Term Goals - 11/12/18 1406      PT LONG TERM GOAL #1   Title  independent with HEP    Time  6    Period  Weeks    Status  On-going      PT LONG TERM GOAL #2   Title  report ability to walk > 40 min without increase in pain for improved function    Baseline  able to walk 20 min prior to pain.    Time  6    Period  Weeks    Status  On-going      PT LONG TERM GOAL #3   Title  demonstrate at least 4/5 Lt hip strength for improved function and mobility    Period  Weeks    Status  On-going      PT LONG TERM GOAL #4   Title  FOTO score improved to </= 20% limited for improved function    Time  6    Period  Weeks    Status  On-going            Plan -  11/12/18 1448    Clinical Impression Statement  Some improvement in LLE strength noted; continues to be very limited in Lt hip flexion.  Pt tolerated all exercises well; very challenged with SLR, requiring assistance from therapist for initiation.  Progressing towards goals.    Personal Factors and Comorbidities  Comorbidity 3+    Comorbidities  sleep apnea, depression , PTSD, DM    Examination-Activity Limitations  Dressing;Locomotion Level;Stairs;Bend    Examination-Participation Restrictions  Other;Driving   work   [employer]  Evolving/Moderate complexity    Rehab Potential  Good    PT Frequency  2x / week   1-2x/wk depending on pt's work schedule   PT Duration  6 weeks    PT Treatment/Interventions  ADLs/Self Care Home Management;Cryotherapy;Electrical Stimulation;Iontophoresis 4mg /ml Dexamethasone;Moist Heat;Therapeutic exercise;Therapeutic activities;Functional mobility training;Stair training;Gait training;Ultrasound;Patient/family education;Manual techniques;Taping;Dry needling;Passive range of motion    PT Next Visit Plan  progress flexibility and strengthening, manual (LAD), modalities PRN    PT Home Exercise Plan  Access Code: [phone]    Consulted and Agree with Plan of Care  Patient       Patient will benefit from skilled therapeutic intervention in order to improve the following deficits and impairments:  Pain, Obesity, Increased fascial restricitons, Decreased strength, Difficulty walking, Decreased mobility, Impaired flexibility, Hypomobility, Decreased range of motion  Visit Diagnosis: 1. Pain in left hip   2. Stiffness of left hip, not elsewhere classified   3. Muscle weakness (generalized)        Problem List Patient Active Problem List   Diagnosis Date Noted  . Primary osteoarthritis of both hips 11/12/2018  . Lateral pain of left hip 10/22/2018  . Peripheral edema 09/16/2017  . Class 3 severe obesity due to excess calories with serious  comorbidity in adult ([place]) 06/15/2017  . Controlled type 2 diabetes mellitus without complication, without long-term current use of insulin ([place]) 06/15/2017  . Encounter for medication monitoring 06/02/2017  . 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  Hypertension associated with diabetes ([place]) 01/10/2017  . Elevated hematocrit 11/24/2016  . Elevated blood pressure reading 08/11/2016  . Hypogonadism in male 06/01/2016  . Abnormal prostate specific antigen (PSA) 06/01/2016  . Moderate episode of recurrent major depressive disorder ([place]) 05/30/2016  . Chronic post-traumatic stress disorder (PTSD) 05/30/2016  . Nightmares associated with chronic post-traumatic stress disorder 05/30/2016  . Lumbar degenerative disc disease 02/05/2016  . Insomnia secondary to anxiety 12/22/2015  . OSA on CPAP 12/02/2013  . Severe obesity (BMI >= 40) ([place]) 12/02/2013  . Family history of prostate cancer 12/02/2013  . Hyperlipidemia    Kerin Perna, PTA 11/12/18 2:57 PM  [place], [place], [address] Phone: [phone]   Fax:  [phone]  Name: Christopher [name] MRN: [id number] Date of Birth: Mar 25, 1971

## 2018-11-27 ENCOUNTER — Encounter: Payer: BC Managed Care – PPO | Admitting: Physical Therapy

## 2018-12-01 ENCOUNTER — Ambulatory Visit: Payer: BC Managed Care – PPO | Admitting: Physical Therapy

## 2018-12-01 ENCOUNTER — Encounter (INDEPENDENT_AMBULATORY_CARE_PROVIDER_SITE_OTHER): Payer: Self-pay

## 2018-12-01 ENCOUNTER — Encounter: Payer: Self-pay | Admitting: Physical Therapy

## 2018-12-01 ENCOUNTER — Other Ambulatory Visit: Payer: Self-pay

## 2018-12-01 DIAGNOSIS — M6281 Muscle weakness (generalized): Secondary | ICD-10-CM

## 2018-12-01 DIAGNOSIS — M25552 Pain in left hip: Secondary | ICD-10-CM | POA: Diagnosis not present

## 2018-12-01 DIAGNOSIS — M25652 Stiffness of left hip, not elsewhere classified: Secondary | ICD-10-CM | POA: Diagnosis not present

## 2018-12-01 NOTE — Therapy (Signed)
[facility] [place], [place], [address] Phone: [phone]   Fax:  [phone]  Physical Therapy Treatment/Discharge  Patient Details  Name: Christopher [name] MRN: [id number] Date of Birth: [birth year] Referring Provider (PT): Trixie Dredge, [place]   Encounter Date: 12/01/2018  PT End of Session - 12/01/18 0815    Visit Number  4    Number of Visits  12    Date for PT Re-Evaluation  12/10/18    PT Start Time  0730    PT Stop Time  0810    PT Time Calculation (min)  40 min    Activity Tolerance  Patient tolerated treatment well    Behavior During Therapy  [facility] for tasks assessed/performed       Past Medical History:  Diagnosis Date  . Depression   . Hyperlipidemia   . Hypogonadal obesity   . Lumbar degenerative disc disease   . Obesity   . OSA (obstructive sleep apnea) 12/02/2013   CPAP therapy on 17 cm H2O with a Wide size Resmed Nasal Mask Mirage FX mask and heated humidification   . Post traumatic stress disorder (PTSD)   . Prediabetes 2018    Past Surgical History:  Procedure Laterality Date  . VASECTOMY  08/2017    There were no vitals filed for this visit.  Subjective Assessment - 12/01/18 0732    Subjective  has been moving - a little sore in the hip but overall "it's holding up"    How long can you walk comfortably?  40 min    Diagnostic tests  xrays: mild arthritis bil    Patient Stated Goals  improve pain    Currently in Pain?  No/denies         [facility] PT Assessment - 12/01/18 0747      Assessment   Medical Diagnosis  M25.552 (ICD-10-CM) - Lateral pain of left hip    Referring Provider (PT)  Trixie Dredge, PA-C      Observation/Other Assessments   Focus on Therapeutic Outcomes (FOTO)   91 (9% limited)      Strength   Right Hip Flexion  5/5    Right Hip Extension  4+/5    Right Hip ABduction  5/5    Left Hip Flexion  4/5    Left Hip Extension  4+/5     Left Hip ABduction  4+/5    Right Knee Flexion  5/5    Right Knee Extension  5/5    Left Knee Flexion  5/5    Left Knee Extension  5/5                   [facility] Adult PT Treatment/Exercise - 12/01/18 0735      Knee/Hip Exercises: Stretches   Hip Flexor Stretch  Both;2 reps;30 seconds    Piriformis Stretch  Left;Right;2 reps;30 seconds      Knee/Hip Exercises: Aerobic   Nustep  L5 x 6 min      Knee/Hip Exercises: Standing   Functional Squat  2 sets;10 reps    Functional Squat Limitations  10# in each hand    Other Standing Knee Exercises  deadlift with 25# plate 2x10             PT Education - 12/01/18 0814    Education Details  added to HEP    Person(s) Educated  Patient    Methods  Explanation;Demonstration;Handout    Comprehension  Verbalized understanding;Returned  demonstration          PT Long Term Goals - 12/01/18 0815      PT LONG TERM GOAL #1   Title  independent with HEP    Time  6    Period  Weeks    Status  Achieved      PT LONG TERM GOAL #2   Title  report ability to walk > 40 min without increase in pain for improved function    Baseline  able to walk 20 min prior to pain.    Time  6    Period  Weeks    Status  Achieved      PT LONG TERM GOAL #3   Title  demonstrate at least 4/5 Lt hip strength for improved function and mobility    Period  Weeks    Status  Achieved      PT LONG TERM GOAL #4   Title  FOTO score improved to </= 20% limited for improved function    Time  6    Period  Weeks    Status  Achieved            Plan - 12/01/18 0815    Clinical Impression Statement  Pt has met all goals, and feels ready for d/c at this time.  Will d/c PT.    Personal Factors and Comorbidities  Comorbidity 3+    Comorbidities  sleep apnea, depression , PTSD, DM    Examination-Activity Limitations  Dressing;Locomotion Level;Stairs;Bend    Examination-Participation Restrictions  Other;Driving   work   Copy  Evolving/Moderate complexity    Rehab Potential  Good    PT Frequency  2x / week   1-2x/wk depending on pt's work schedule   PT Duration  6 weeks    PT Treatment/Interventions  ADLs/Self Care Home Management;Cryotherapy;Electrical Stimulation;Iontophoresis 60m/ml Dexamethasone;Moist Heat;Therapeutic exercise;Therapeutic activities;Functional mobility training;Stair training;Gait training;Ultrasound;Patient/family education;Manual techniques;Taping;Dry needling;Passive range of motion    PT Next Visit Plan  d/c PT today    PT Home Exercise Plan  Access Code: [phone]   Consulted and Agree with Plan of Care  Patient       Patient will benefit from skilled therapeutic intervention in order to improve the following deficits and impairments:  Pain, Obesity, Increased fascial restricitons, Decreased strength, Difficulty walking, Decreased mobility, Impaired flexibility, Hypomobility, Decreased range of motion  Visit Diagnosis: 1. Pain in left hip   2. Stiffness of left hip, not elsewhere classified   3. Muscle weakness (generalized)        Problem List Patient Active Problem List   Diagnosis Date Noted  . Primary osteoarthritis of both hips 11/12/2018  . Lateral pain of left hip 10/22/2018  . Peripheral edema 09/16/2017  . Class 3 severe obesity due to excess calories with serious comorbidity in adult ([place] 06/15/2017  . Controlled type 2 diabetes mellitus without complication, without long-term current use of insulin ([place] 06/15/2017  . Encounter for medication monitoring 06/02/2017  . 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 Hypertension associated with diabetes ([place] 01/10/2017  . Elevated hematocrit 11/24/2016  . Elevated blood pressure reading 08/11/2016  . Hypogonadism in male 06/01/2016  . Abnormal prostate specific antigen (PSA) 06/01/2016  . Moderate episode of recurrent major depressive disorder ([place] 05/30/2016  . Chronic post-traumatic stress  disorder (PTSD) 05/30/2016  . Nightmares associated with chronic post-traumatic stress disorder 05/30/2016  . Lumbar degenerative disc disease 02/05/2016  . Insomnia secondary to anxiety 12/22/2015  .  OSA on CPAP 12/02/2013  . Severe obesity (BMI >= 40) ([place]) 12/02/2013  . Family history of prostate cancer 12/02/2013  . Hyperlipidemia       Laureen Abrahams, PT, DPT 12/01/18 8:16 AM    [facility] [place], [place], [address] Phone: [phone]   Fax:  [phone]  Name: Christopher [name] MRN: [id number] Date of Birth: June 27, 1970     PHYSICAL THERAPY DISCHARGE SUMMARY  Visits from Start of Care: 4  Current functional level related to goals / functional outcomes: See above   Remaining deficits: See above   Education / Equipment: HEP  Plan: Patient agrees to discharge.  Patient goals were met. Patient is being discharged due to meeting the stated rehab goals.  ?????    Laureen Abrahams, PT, DPT 12/01/18 8:17 AM  [facility] [place], [place] [address]  [phone] (office) [phone] (fax)

## 2018-12-01 NOTE — Patient Instructions (Signed)
Access Code: [email or web address]  URL: [email or web address]  Date: 12/01/2018  Prepared by: Faustino Congress   Exercises  Seated Hamstring Stretch - 3 reps - 1 sets - 30 sec hold - 2x daily - 7x weekly  Seated Figure 4 Piriformis Stretch - 3 reps - 1 sets - 30 sec hold - 2x daily - 7x weekly  Supine Piriformis Stretch with Towel - 3 reps - 1 sets - 30 sec hold - 2x daily - 7x weekly  Seated March - 10 reps - 1 sets - 1-2 sec hold - 2x daily - 7x weekly  Supine Bridge - 10 reps - 1 sets - 5 sec hold - 2x daily - 7x weekly  Sidelying Hip Abduction - 1 sets - 10 reps - 2x daily - 7x weekly  Seated Table Piriformis Stretch - 2-3 reps - 1 sets - 30 seconds hold - 2x daily - 7x weekly  Seated Hip Flexor Stretch - 2-3 reps - 1 sets - 30 seconds hold - 2x daily - 7x weekly  Half Squat with Barbell - 10 reps - 3 sets - 1x daily - 7x weekly  Standing Deadlift with Barbell - Knees Straight - 10 reps - 3 sets - 1x daily - 7x weekly

## 2018-12-30 DIAGNOSIS — G4733 Obstructive sleep apnea (adult) (pediatric): Secondary | ICD-10-CM | POA: Diagnosis not present

## 2019-02-11 ENCOUNTER — Other Ambulatory Visit: Payer: Self-pay

## 2019-02-11 ENCOUNTER — Encounter: Payer: Self-pay | Admitting: Family Medicine

## 2019-02-11 ENCOUNTER — Ambulatory Visit (INDEPENDENT_AMBULATORY_CARE_PROVIDER_SITE_OTHER): Payer: BC Managed Care – PPO | Admitting: Family Medicine

## 2019-02-11 VITALS — BP 130/89 | HR 82 | Wt 303.0 lb

## 2019-02-11 DIAGNOSIS — F4312 Post-traumatic stress disorder, chronic: Secondary | ICD-10-CM | POA: Diagnosis not present

## 2019-02-11 DIAGNOSIS — E119 Type 2 diabetes mellitus without complications: Secondary | ICD-10-CM

## 2019-02-11 DIAGNOSIS — Z23 Encounter for immunization: Secondary | ICD-10-CM | POA: Diagnosis not present

## 2019-02-11 DIAGNOSIS — I152 Hypertension secondary to endocrine disorders: Secondary | ICD-10-CM

## 2019-02-11 DIAGNOSIS — I10 Essential (primary) hypertension: Secondary | ICD-10-CM

## 2019-02-11 DIAGNOSIS — R972 Elevated prostate specific antigen [PSA]: Secondary | ICD-10-CM

## 2019-02-11 DIAGNOSIS — R718 Other abnormality of red blood cells: Secondary | ICD-10-CM

## 2019-02-11 DIAGNOSIS — E782 Mixed hyperlipidemia: Secondary | ICD-10-CM

## 2019-02-11 DIAGNOSIS — E1159 Type 2 diabetes mellitus with other circulatory complications: Secondary | ICD-10-CM

## 2019-02-11 MED ORDER — VALSARTAN-HYDROCHLOROTHIAZIDE 320-12.5 MG PO TABS: 1.0000 | ORAL_TABLET | Freq: Every day | ORAL | 3 refills | Status: DC

## 2019-02-11 MED ORDER — METFORMIN HCL 500 MG PO TABS: 500.0000 mg | ORAL_TABLET | Freq: Every day | ORAL | 3 refills | Status: DC

## 2019-02-11 MED ORDER — DOXEPIN HCL 75 MG PO CAPS: 75.0000 mg | ORAL_CAPSULE | Freq: Every day | ORAL | 3 refills | Status: DC

## 2019-02-11 MED ORDER — ATORVASTATIN CALCIUM 10 MG PO TABS: 10.0000 mg | ORAL_TABLET | Freq: Every day | ORAL | 3 refills | Status: DC

## 2019-02-11 MED ORDER — SILDENAFIL CITRATE 100 MG PO TABS: 50.0000 mg | ORAL_TABLET | Freq: Every day | ORAL | 11 refills | Status: DC | PRN

## 2019-02-11 MED ORDER — PRAZOSIN HCL 1 MG PO CAPS: 1.0000 mg | ORAL_CAPSULE | Freq: Every day | ORAL | 3 refills | Status: DC

## 2019-02-11 NOTE — Progress Notes (Signed)
Christopher [name] is a 48 y.o. male who presents to [facility] today for diabetes hypertension hyperlipidemia.  Patient previously was cared for by Sherlie Ban, PA however she is left the practice.  He is receiving interim care until he can establish care with new PCP likely in 3 to 4 months.  Diabetes: Currently quite well managed with diet and exercise and low-dose metformin 500 mg daily.  His A1c was checked 3 months ago and was 6.0.  He feels well with how things are going and is improving his lifestyle further.  Would like to discontinue metformin if possible.  Hypertension: Currently managed with medications listed below.  No chest pain palpitation shortness of breath.    Hyperlipidemia: Managed with atorvastatin 10 without any problems.    ROS as above:  Exam:  BP 130/89   Pulse 82   Wt (!) 303 lb (137.4 kg)   BMI 46.07 kg/m  Wt Readings from Last 5 Encounters:  02/11/19 (!) 303 lb (137.4 kg)  11/12/18 (!) 311 lb (141.1 kg)  10/22/18 (!) 310 lb (140.6 kg)  07/17/18 (!) 305 lb (138.3 kg)  05/25/18 290 lb (131.5 kg)    Gen: Well NAD obese HEENT: EOMI,  MMM Lungs: Normal work of breathing. CTABL Heart: RRR no MRG Abd: NABS, Soft. Nondistended, Nontender Exts: Brisk capillary refill, warm and well perfused.   Lab and Radiology Results Lab Results  Component Value Date   HGBA1C 6.0 11/12/2018      Assessment and Plan: 48 y.o. male with  Diabetes: Typically very well managed with minimal metformin.  Plan to continue current regimen work on continued healthy lifestyle including diet and exercise.  We will arrange for labs to be done in January which should be about 3 months from now but also when his fasting labs were done last year as well.  Check back with new PCP in February or March.  Hypertension: Blood pressure mildly elevated today.   Plan for home blood pressure log follow-up with new PCP.  Next Hyperlipidemia: As noted above we will refill medications but get fasting labs as above.  We will also follow-up elevated PSA history with PSA in January.  Flu vaccine given today.  PDMP not reviewed this encounter. Orders Placed This Encounter  Procedures  . Flu Vaccine QUAD 36+ mos IM  . CBC  . COMPLETE METABOLIC PANEL WITH GFR  . Lipid Panel w/reflex Direct LDL  . Hemoglobin A1c  . PSA   Meds ordered this encounter  Medications  . atorvastatin (LIPITOR) 10 MG tablet    Sig: Take 1 tablet (10 mg total) by mouth daily.    Dispense:  90 tablet    Refill:  3  . doxepin (SINEQUAN) 75 MG capsule    Sig: Take 1 capsule (75 mg total) by mouth at bedtime.    Dispense:  90 capsule    Refill:  3  . metFORMIN (GLUCOPHAGE) 500 MG tablet    Sig: Take 1 tablet (500 mg total) by mouth daily with breakfast.    Dispense:  90 tablet    Refill:  3  . prazosin (MINIPRESS) 1 MG capsule    Sig: Take 1 capsule (1 mg total) by mouth at bedtime.    Dispense:  90 capsule    Refill:  3  . valsartan-hydrochlorothiazide (DIOVAN-HCT) 320-12.5 MG tablet    Sig: Take 1 tablet by mouth daily.    Dispense:  90 tablet    Refill:  3  . sildenafil (VIAGRA) 100 MG tablet    Sig: Take 0.5-1 tablets (50-100 mg total) by mouth daily as needed for erectile dysfunction.    Dispense:  30 tablet    Refill:  11     Historical information moved to improve visibility of documentation.  Past Medical History:  Diagnosis Date  . Depression   . Hyperlipidemia   . Hypogonadal obesity   . Lumbar degenerative disc disease   . Obesity   . OSA (obstructive sleep apnea) 12/02/2013   CPAP therapy on 17 cm H2O with a Wide size Resmed Nasal Mask Mirage FX mask and heated humidification   . Post traumatic stress disorder (PTSD)   . Prediabetes 2018   Past Surgical History:  Procedure Laterality Date  . VASECTOMY  08/2017   Social History   Tobacco  Use  . Smoking status: Never Smoker  . Smokeless tobacco: Never Used  Substance Use Topics  . Alcohol use: Yes   family history includes Prostate cancer in an other family member.  Medications: Current Outpatient Medications  Medication Sig Dispense Refill  . AMBULATORY NON FORMULARY MEDICATION CPAP therapy on 17 cm H2O with a Wide size Resmed Nasal Mask Mirage FX mask and heated humidification. Use nightly for sleep.  Dx: Obstructive Sleep Apnea 1 Units 0  . aspirin EC 81 MG tablet Take 1 tablet (81 mg total) by mouth daily. 90 tablet 3  . atorvastatin (LIPITOR) 10 MG tablet Take 1 tablet (10 mg total) by mouth daily. 90 tablet 3  . doxepin (SINEQUAN) 75 MG capsule Take 1 capsule (75 mg total) by mouth at bedtime. 90 capsule 3  . EPINEPHrine 0.3 mg/0.3 mL IJ SOAJ injection Inject 0.3 mLs (0.3 mg total) into the muscle as needed for anaphylaxis. 1 each 3  . meloxicam (MOBIC) 15 MG tablet One tab PO qAM with breakfast for 2 weeks, then daily prn pain. 30 tablet 3  . metFORMIN (GLUCOPHAGE) 500 MG tablet Take 1 tablet (500 mg total) by mouth daily with breakfast. 90 tablet 3  . prazosin (MINIPRESS) 1 MG capsule Take 1 capsule (1 mg total) by mouth at bedtime. 90 capsule 3  . sildenafil (REVATIO) 20 MG tablet Take 1-5 tablets (20-100 mg total) by mouth as needed. 30 tablet 2  . sildenafil (VIAGRA) 100 MG tablet Take 0.5-1 tablets (50-100 mg total) by mouth daily as needed for erectile dysfunction. 30 tablet 11  . valsartan-hydrochlorothiazide (DIOVAN-HCT) 320-12.5 MG tablet Take 1 tablet by mouth daily. 90 tablet 3   No current facility-administered medications for this visit.    No Known Allergies   Discussed warning signs or symptoms. Please see discharge instructions. Patient expresses understanding.

## 2019-02-11 NOTE — Patient Instructions (Addendum)
Thank you for coming in today. Get fasting labs in [facility] January.  Recheck with Dr Zigmund Daniel in February or March.  Recheck sooner if needed.

## 2019-02-12 ENCOUNTER — Ambulatory Visit: Payer: BC Managed Care – PPO | Admitting: Physician Assistant

## 2019-02-18 ENCOUNTER — Ambulatory Visit: Payer: BC Managed Care – PPO | Admitting: Osteopathic Medicine

## 2019-05-21 DIAGNOSIS — E782 Mixed hyperlipidemia: Secondary | ICD-10-CM | POA: Diagnosis not present

## 2019-05-21 DIAGNOSIS — R972 Elevated prostate specific antigen [PSA]: Secondary | ICD-10-CM | POA: Diagnosis not present

## 2019-05-21 DIAGNOSIS — I10 Essential (primary) hypertension: Secondary | ICD-10-CM | POA: Diagnosis not present

## 2019-05-21 DIAGNOSIS — E1159 Type 2 diabetes mellitus with other circulatory complications: Secondary | ICD-10-CM | POA: Diagnosis not present

## 2019-05-21 DIAGNOSIS — E119 Type 2 diabetes mellitus without complications: Secondary | ICD-10-CM | POA: Diagnosis not present

## 2019-05-22 LAB — CBC
HCT: 46.6 % (ref 38.5–50.0)
Hemoglobin: 14.9 g/dL (ref 13.2–17.1)
MCH: 26.7 pg — ABNORMAL LOW (ref 27.0–33.0)
MCHC: 32 g/dL (ref 32.0–36.0)
MCV: 83.5 fL (ref 80.0–100.0)
MPV: 10.2 fL (ref 7.5–12.5)
Platelets: 332 10*3/uL (ref 140–400)
RBC: 5.58 10*6/uL (ref 4.20–5.80)
RDW: 15.3 % — ABNORMAL HIGH (ref 11.0–15.0)
WBC: 6.3 10*3/uL (ref 3.8–10.8)

## 2019-05-22 LAB — PSA: PSA: 2 ng/mL (ref <=4.0)

## 2019-05-22 LAB — LIPID PANEL W/REFLEX DIRECT LDL
Cholesterol: 195 mg/dL (ref <200)
HDL: 45 mg/dL (ref >=40)
LDL Cholesterol (Calc): 124 mg/dL (calc) — ABNORMAL HIGH
Non-HDL Cholesterol (Calc): 150 mg/dL (calc) — ABNORMAL HIGH (ref <130)
Total CHOL/HDL Ratio: 4.3 (calc) (ref <5.0)
Triglycerides: 149 mg/dL (ref <150)

## 2019-05-22 LAB — HEMOGLOBIN A1C
Hgb A1c MFr Bld: 6.2 % of total Hgb — ABNORMAL HIGH (ref <5.7)
Mean Plasma Glucose: 131 (calc)
eAG (mmol/L): 7.3 (calc)

## 2019-05-22 LAB — COMPLETE METABOLIC PANEL WITH GFR
AG Ratio: 1.3 (calc) (ref 1.0–2.5)
ALT: 27 U/L (ref 9–46)
AST: 19 U/L (ref 10–40)
Albumin: 4.5 g/dL (ref 3.6–5.1)
Alkaline phosphatase (APISO): 78 U/L (ref 36–130)
BUN: 19 mg/dL (ref 7–25)
CO2: 30 mmol/L (ref 20–32)
Calcium: 9.9 mg/dL (ref 8.6–10.3)
Chloride: 103 mmol/L (ref 98–110)
Creat: 1.07 mg/dL (ref 0.60–1.35)
GFR, Est African American: 94 mL/min/{1.73_m2} (ref >=60)
GFR, Est Non African American: 81 mL/min/{1.73_m2} (ref >=60)
Globulin: 3.5 g/dL (calc) (ref 1.9–3.7)
Glucose, Bld: 97 mg/dL (ref 65–99)
Potassium: 4.5 mmol/L (ref 3.5–5.3)
Sodium: 141 mmol/L (ref 135–146)
Total Bilirubin: 0.4 mg/dL (ref 0.2–1.2)
Total Protein: 8 g/dL (ref 6.1–8.1)

## 2019-05-25 NOTE — Progress Notes (Signed)
Call pt:  PSA stable.  A1C is up to 6.2. are you taking metformin 500mg  twice a day.  LDL not to goal. Are you taking lipitor?  Kidney, liver look good.   Christopher [name]

## 2019-05-28 ENCOUNTER — Other Ambulatory Visit: Payer: Self-pay | Admitting: Physician Assistant

## 2019-05-28 MED ORDER — ATORVASTATIN CALCIUM 20 MG PO TABS: 20.0000 mg | ORAL_TABLET | Freq: Every day | ORAL | 3 refills | Status: DC

## 2019-05-28 NOTE — Progress Notes (Signed)
Stay on same dose of metformin.  Increased lipitor to 20mg  daily. I sent new rx.  Keep follow up to reestablish care.

## 2019-06-04 DIAGNOSIS — G4733 Obstructive sleep apnea (adult) (pediatric): Secondary | ICD-10-CM | POA: Diagnosis not present

## 2019-09-06 DIAGNOSIS — G4733 Obstructive sleep apnea (adult) (pediatric): Secondary | ICD-10-CM | POA: Diagnosis not present

## 2019-09-09 ENCOUNTER — Ambulatory Visit (INDEPENDENT_AMBULATORY_CARE_PROVIDER_SITE_OTHER): Payer: BC Managed Care – PPO

## 2019-09-09 ENCOUNTER — Ambulatory Visit: Payer: BC Managed Care – PPO | Admitting: Sports Medicine

## 2019-09-09 ENCOUNTER — Other Ambulatory Visit: Payer: Self-pay

## 2019-09-09 ENCOUNTER — Encounter: Payer: Self-pay | Admitting: Sports Medicine

## 2019-09-09 DIAGNOSIS — M542 Cervicalgia: Secondary | ICD-10-CM

## 2019-09-09 MED ORDER — CYCLOBENZAPRINE HCL 10 MG PO TABS: ORAL_TABLET | ORAL | 0 refills | Status: DC

## 2019-09-09 MED ORDER — PREDNISONE 50 MG PO TABS: ORAL_TABLET | ORAL | 0 refills | Status: DC

## 2019-09-09 NOTE — Progress Notes (Signed)
    Procedures performed today:    None.  Independent interpretation of notes and tests performed by another provider:   None.  Brief History, Exam, Impression, and Recommendations:    Acute neck pain This is a pleasant 49 year old male with a history of lumbar DDD. Almost 2 weeks ago he was lifting a piece of furniture, felt a sharp pain in the left side of his neck with radiation around the left periscapular region but not down the arm to the fingertips. Worse with prolonged downgaze, no weakness, no constitutional symptoms. On exam he only has minimal paracervical tenderness, good strength, good reflexes. This is likely a paracervical muscle strain with underlying cervical DDD. Adding x-rays, 5 days of prednisone, Flexeril at bedtime, home rehab exercises, return to see me in 1 month, MRI if no better. He is going to establish care with one of our primary care providers.    ___________________________________________ Gwen Her. Dianah Field, M.D., ABFM., CAQSM. Primary Care and [place] Instructor of [place] [facility]

## 2019-09-09 NOTE — Assessment & Plan Note (Signed)
This is a pleasant 49 year old male with a history of lumbar DDD. Almost 2 weeks ago he was lifting a piece of furniture, felt a sharp pain in the left side of his neck with radiation around the left periscapular region but not down the arm to the fingertips. Worse with prolonged downgaze, no weakness, no constitutional symptoms. On exam he only has minimal paracervical tenderness, good strength, good reflexes. This is likely a paracervical muscle strain with underlying cervical DDD. Adding x-rays, 5 days of prednisone, Flexeril at bedtime, home rehab exercises, return to see me in 1 month, MRI if no better. He is going to establish care with one of our primary care providers.

## 2019-10-07 ENCOUNTER — Ambulatory Visit (INDEPENDENT_AMBULATORY_CARE_PROVIDER_SITE_OTHER): Payer: BC Managed Care – PPO | Admitting: Sports Medicine

## 2019-10-07 ENCOUNTER — Ambulatory Visit (INDEPENDENT_AMBULATORY_CARE_PROVIDER_SITE_OTHER): Payer: BC Managed Care – PPO | Admitting: Family Medicine

## 2019-10-07 ENCOUNTER — Encounter: Payer: Self-pay | Admitting: Sports Medicine

## 2019-10-07 ENCOUNTER — Encounter: Payer: Self-pay | Admitting: Family Medicine

## 2019-10-07 VITALS — BP 120/77 | HR 78 | Temp 98.3°F | Ht 68.11 in | Wt 326.0 lb

## 2019-10-07 DIAGNOSIS — M5136 Other intervertebral disc degeneration, lumbar region: Secondary | ICD-10-CM

## 2019-10-07 DIAGNOSIS — H5212 Myopia, left eye: Secondary | ICD-10-CM | POA: Diagnosis not present

## 2019-10-07 DIAGNOSIS — F4312 Post-traumatic stress disorder, chronic: Secondary | ICD-10-CM

## 2019-10-07 DIAGNOSIS — H40023 Open angle with borderline findings, high risk, bilateral: Secondary | ICD-10-CM | POA: Diagnosis not present

## 2019-10-07 DIAGNOSIS — E119 Type 2 diabetes mellitus without complications: Secondary | ICD-10-CM | POA: Diagnosis not present

## 2019-10-07 DIAGNOSIS — M51369 Other intervertebral disc degeneration, lumbar region without mention of lumbar back pain or lower extremity pain: Secondary | ICD-10-CM

## 2019-10-07 DIAGNOSIS — E782 Mixed hyperlipidemia: Secondary | ICD-10-CM | POA: Diagnosis not present

## 2019-10-07 DIAGNOSIS — Z1211 Encounter for screening for malignant neoplasm of colon: Secondary | ICD-10-CM

## 2019-10-07 DIAGNOSIS — M542 Cervicalgia: Secondary | ICD-10-CM

## 2019-10-07 DIAGNOSIS — R972 Elevated prostate specific antigen [PSA]: Secondary | ICD-10-CM

## 2019-10-07 DIAGNOSIS — I152 Hypertension secondary to endocrine disorders: Secondary | ICD-10-CM

## 2019-10-07 DIAGNOSIS — E1159 Type 2 diabetes mellitus with other circulatory complications: Secondary | ICD-10-CM | POA: Diagnosis not present

## 2019-10-07 DIAGNOSIS — I10 Essential (primary) hypertension: Secondary | ICD-10-CM

## 2019-10-07 DIAGNOSIS — M160 Bilateral primary osteoarthritis of hip: Secondary | ICD-10-CM

## 2019-10-07 MED ORDER — MELOXICAM 15 MG PO TABS: ORAL_TABLET | ORAL | 3 refills | Status: DC

## 2019-10-07 NOTE — Assessment & Plan Note (Signed)
Well controlled symptoms with prazosin and doxepin.  Continue.

## 2019-10-07 NOTE — Progress Notes (Signed)
Christopher [name] - 49 y.o. male MRN [id number]  Date of birth: 1971-01-17  Subjective Chief Complaint  Patient presents with  . Establish Care    HPI Christopher [name] is a 49 y.o. male with history of HTN, T2DM, OSA on CPAP, HLD and PTSD.  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  He has seen Dr. Darene Lamer recently for neck pain which has resolved at this point.  He does complain of L hip pain today.    -HTN:  Current management with valsartan/hctz.  Doing well with this.  He denies symptoms related to HTN including chest pain, shortness of breath, palpitations, headache or vision changes.   -T2DM:  Historically well controlled.  He is taking metformin 500mg  daily.  He does not check blood sugars regularly at home.  He denies increased thirst or urination.   -HLD:  Current management with atorvastatin.  Denies side effects from this medication including myalgias.    -Hip pain:  L hip pain for several months.  Had xray last year showing mild DJD.  Pain is mostly in groin area but also has some lateral pain at times.  He does have some radiation of pain down the leg sometimes.  He does get some relief if taking ibuprofen.    -PTSD:  PTSD related to prior [employer] service.  Has had some nightmares and insomnia that are fairly well controlled with doxepin and prazosin.   ROS:  A comprehensive ROS was completed and negative except as noted per HPI  No Known Allergies  Past Medical History:  Diagnosis Date  . Depression   . Hyperlipidemia   . Hypogonadal obesity   . Lumbar degenerative disc disease   . Obesity   . OSA (obstructive sleep apnea) 12/02/2013   CPAP therapy on 17 cm H2O with a Wide size Resmed Nasal Mask Mirage FX mask and heated humidification   . Post traumatic stress disorder (PTSD)   . Prediabetes 2018    Past Surgical History:  Procedure Laterality Date  . VASECTOMY  08/2017    Social History   Socioeconomic History  .  Marital status: Single    Spouse name: Not on file  . Number of children: Not on file  . Years of education: Not on file  . Highest education level: Not on file  Occupational History  . Not on file  Tobacco Use  . Smoking status: Never Smoker  . Smokeless tobacco: Never Used  Substance and Sexual Activity  . Alcohol use: Yes  . Drug use: No  . Sexual activity: Yes    Partners: Female  Other Topics Concern  . Not on file  Social History Narrative   ** Merged History Encounter **       Social Determinants of Health   Financial Resource Strain:   . Difficulty of Paying Living Expenses:   Food Insecurity:   . Worried About Charity fundraiser in the Last Year:   . Arboriculturist in the Last Year:   Transportation Needs:   . Film/video editor (Medical):   Marland Kitchen Lack of Transportation (Non-Medical):   Physical Activity:   . Days of Exercise per Week:   . Minutes of Exercise per Session:   Stress:   . Feeling of Stress :   Social Connections:   . Frequency of Communication with Friends and Family:   . Frequency of Social Gatherings with Friends and  Family:   . Attends Religious Services:   . Active Member of Clubs or Organizations:   . Attends Archivist Meetings:   Marland Kitchen Marital Status:     Family History  Problem Relation Age of Onset  . Prostate cancer Other     Health Maintenance  Topic Date Due  . HIV Screening  Never done  . OPHTHALMOLOGY EXAM  01/10/2019  . COLONOSCOPY  Never done  . FOOT EXAM  11/12/2019  . HEMOGLOBIN A1C  11/18/2019  . INFLUENZA VACCINE  12/05/2019  . TETANUS/TDAP  05/06/2022  . PNEUMOCOCCAL POLYSACCHARIDE VACCINE AGE 99-64 HIGH RISK  Completed  . COVID-19 Vaccine  Completed     ----------------------------------------------------------------------------------------------------------------------------------------------------------------------------------------------------------------- Physical Exam BP 120/77 (BP Location:  Left Arm, Patient Position: Sitting, Cuff Size: Large)   Pulse 78   Temp 98.3 F (36.8 C) (Oral)   Ht 5' 8.11" (1.73 m)   Wt (!) 326 lb (147.9 kg)   SpO2 95%   BMI 49.41 kg/m   Physical Exam Constitutional:      Appearance: Normal appearance.  HENT:     Head: Normocephalic and atraumatic.  Eyes:     General: No scleral icterus. Cardiovascular:     Rate and Rhythm: Normal rate and regular rhythm.  Pulmonary:     Effort: Pulmonary effort is normal.     Breath sounds: Normal breath sounds.  Musculoskeletal:     Cervical back: Neck supple.     Comments: Hip ROM is fairly good although he has pain with internal rotation of the L hip.  Painful with flexion in the groin area. Non tender over greater trochanter.    Neurological:     General: No focal deficit present.     Mental Status: He is alert.  Psychiatric:        Mood and Affect: Mood normal.        Behavior: Behavior normal.     ------------------------------------------------------------------------------------------------------------------------------------------------------------------------------------------------------------------- Assessment and Plan  Controlled type 2 diabetes mellitus without complication, without long-term current use of insulin (HCC) Update a1c today.  He will continue metformin and we discussed working on increasing activity to help with weight loss.   Hypertension associated with diabetes ([place]) Blood pressure is at goal at for age and co-morbidities.  I recommend he continue current medications.  In addition they were instructed to follow a low sodium diet with regular exercise to help to maintain adequate control of blood pressure.    Abnormal prostate specific antigen (PSA) Repeat PSA  Primary osteoarthritis of both hips Restart meloxicam He will f/u with Dr. Darene Lamer if not improved with this.   Hyperlipidemia Updated lipid panel ordered.  He will continue atorvastatin for now.   Colon  cancer screening Referral to GI for colonoscopy.   Chronic post-traumatic stress disorder (PTSD) Well controlled symptoms with prazosin and doxepin.  Continue.    Meds ordered this encounter  Medications  . meloxicam (MOBIC) 15 MG tablet    Sig: One tab PO qAM with breakfast for 2 weeks, then daily prn pain.    Dispense:  30 tablet    Refill:  3    Return in about 6 months (around 04/07/2020) for HTN/DM.    This visit occurred during the SARS-CoV-2 public health emergency.  Safety protocols were in place, including screening questions prior to the visit, additional usage of staff PPE, and extensive cleaning of exam room while observing appropriate contact time as indicated for disinfecting solutions.

## 2019-10-07 NOTE — Assessment & Plan Note (Signed)
Christopher [name] returns, he is a pleasant 49 year old male, he was lifting a piece of furniture and felt a sharp pain in the left side of his neck, we treated him aggressively with prednisone, therapy, home rehab, x-rays at the time showed C6-7 DDD, he has improved considerably, no more pain, he will continue the rehab exercises and return to see me on an as-needed basis.

## 2019-10-07 NOTE — Assessment & Plan Note (Signed)
Referral to GI for colonoscopy. 

## 2019-10-07 NOTE — Assessment & Plan Note (Signed)
Updated lipid panel ordered.  He will continue atorvastatin for now.

## 2019-10-07 NOTE — Patient Instructions (Signed)
Great to meet you today! Please continue current medications.  Have labs completed when fasting. We'll be in touch with results.  Try meloxicam daily for hip pain. Follow up with Dr. Darene Lamer if not improving.

## 2019-10-07 NOTE — Assessment & Plan Note (Signed)
Blood pressure is at goal at for age and co-morbidities.  I recommend he continue current medications.  In addition they were instructed to follow a low sodium diet with regular exercise to help to maintain adequate control of blood pressure.   

## 2019-10-07 NOTE — Assessment & Plan Note (Signed)
Update a1c today.  He will continue metformin and we discussed working on increasing activity to help with weight loss.

## 2019-10-07 NOTE — Progress Notes (Signed)
    Procedures performed today:    None.  Independent interpretation of notes and tests performed by another provider:   None.  Brief History, Exam, Impression, and Recommendations:    Acute neck pain Christopher [name] returns, he is a pleasant 49 year old male, he was lifting a piece of furniture and felt a sharp pain in the left side of his neck, we treated him aggressively with prednisone, therapy, home rehab, x-rays at the time showed C6-7 DDD, he has improved considerably, no more pain, he will continue the rehab exercises and return to see me on an as-needed basis.    ___________________________________________ Gwen Her. Dianah Field, M.D., ABFM., CAQSM. Primary Care and [place] Instructor of [place] [facility]

## 2019-10-07 NOTE — Assessment & Plan Note (Signed)
Repeat PSA 

## 2019-10-07 NOTE — Assessment & Plan Note (Signed)
Restart meloxicam He will f/u with Dr. Darene Lamer if not improved with this.

## 2019-11-01 ENCOUNTER — Encounter: Payer: Self-pay | Admitting: Gastroenterology

## 2019-11-05 DIAGNOSIS — H40023 Open angle with borderline findings, high risk, bilateral: Secondary | ICD-10-CM | POA: Diagnosis not present

## 2019-11-26 ENCOUNTER — Encounter: Payer: Self-pay | Admitting: Gastroenterology

## 2019-11-26 ENCOUNTER — Ambulatory Visit (AMBULATORY_SURGERY_CENTER): Payer: Self-pay

## 2019-11-26 ENCOUNTER — Other Ambulatory Visit: Payer: Self-pay

## 2019-11-26 VITALS — Ht 68.0 in | Wt 328.0 lb

## 2019-11-26 DIAGNOSIS — Z1211 Encounter for screening for malignant neoplasm of colon: Secondary | ICD-10-CM

## 2019-11-26 NOTE — Progress Notes (Signed)
No egg or soy allergy known to patient  No issues with past sedation with any surgeries or procedures no intubation problems in the past  No diet pills per patient No home 02 use per patient  No blood thinners per patient  Pt denies issues with constipation  No A fib or A flutter  EMMI video to pt or MyChart  COVID 19 guidelines implemented in [place] today   Pt is vaccinated for covid.  Pt BMI is 49.87, ok for [facility], pt states he is trying to lose weight.  Due to the COVID-19 pandemic we are asking patients to follow these guidelines. Please only bring one care partner. Please be aware that your care partner may wait in the car in the parking lot or if they feel like they will be too hot to wait in the car, they may wait in the lobby on the 4th floor. All care partners are required to wear a mask the entire time (we do not have any that we can provide them), they need to practice social distancing, and we will do a Covid check for all patient's and care partners when you arrive. Also we will check their temperature and your temperature. If the care partner waits in their car they need to stay in the parking lot the entire time and we will call them on their cell phone when the patient is ready for discharge so they can bring the car to the front of the building. Also all patient's will need to wear a mask into building.

## 2019-12-17 ENCOUNTER — Other Ambulatory Visit: Payer: Self-pay

## 2019-12-17 ENCOUNTER — Ambulatory Visit (AMBULATORY_SURGERY_CENTER): Payer: BC Managed Care – PPO | Admitting: Gastroenterology

## 2019-12-17 ENCOUNTER — Encounter: Payer: Self-pay | Admitting: Gastroenterology

## 2019-12-17 VITALS — BP 130/82 | HR 80 | Temp 97.5°F | Resp 17 | Ht 68.0 in | Wt 328.0 lb

## 2019-12-17 DIAGNOSIS — Z1211 Encounter for screening for malignant neoplasm of colon: Secondary | ICD-10-CM | POA: Diagnosis not present

## 2019-12-17 DIAGNOSIS — D124 Benign neoplasm of descending colon: Secondary | ICD-10-CM | POA: Diagnosis not present

## 2019-12-17 DIAGNOSIS — D125 Benign neoplasm of sigmoid colon: Secondary | ICD-10-CM | POA: Diagnosis not present

## 2019-12-17 MED ORDER — SODIUM CHLORIDE 0.9 % IV SOLN: 500.0000 mL | Freq: Once | INTRAVENOUS | Status: DC

## 2019-12-17 NOTE — Op Note (Signed)
[facility] Patient Name: Christopher [name] Procedure Date: 12/17/2019 7:19 AM MRN: [id number] Endoscopist: Jackquline Denmark , MD Age: 49 Referring MD:  Date of Birth: 05-Jul-1970 Gender: Male Account #: [id number] Procedure:                Colonoscopy Indications:              Screening for colorectal malignant neoplasm Medicines:                Monitored Anesthesia Care Procedure:                Pre-Anesthesia Assessment:                           - Prior to the procedure, a History and Physical                            was performed, and patient medications and                            allergies were reviewed. The patient's tolerance of                            previous anesthesia was also reviewed. The risks                            and benefits of the procedure and the sedation                            options and risks were discussed with the patient.                            All questions were answered, and informed consent                            was obtained. Prior Anticoagulants: The patient has                            taken no previous anticoagulant or antiplatelet                            agents. ASA Grade Assessment: III - A patient with                            severe systemic disease. After reviewing the risks                            and benefits, the patient was deemed in                            satisfactory condition to undergo the procedure.                           After obtaining informed consent, the colonoscope  was passed under direct vision. Throughout the                            procedure, the patient's blood pressure, pulse, and                            oxygen saturations were monitored continuously. The                            Colonoscope was introduced through the anus and                            advanced to the the cecum, identified by                            appendiceal orifice and  ileocecal valve. The                            colonoscopy was performed without difficulty. The                            patient tolerated the procedure well. The quality                            of the bowel preparation was good except in the                            right side of the colon where there was adherent                            stool which could not be fully washed. Aggressive                            suctioning aspiration was performed. The ileocecal                            valve, appendiceal orifice, and rectum were                            photographed. Scope In: 8:11:36 AM Scope Out: 8:27:07 AM Scope Withdrawal Time: 0 hours 8 minutes 30 seconds  Total Procedure Duration: 0 hours 15 minutes 31 seconds  Findings:                 Two sessile polyps were found in the mid sigmoid                            colon and mid descending colon. The polyps were 4                            to 6 mm in size. These polyps were removed with a                            cold  snare. Resection and retrieval were complete.                           A few small-mouthed diverticula were found in the                            sigmoid colon.                           Non-bleeding internal hemorrhoids were found during                            retroflexion. The hemorrhoids were small.                           The exam was otherwise without abnormality on                            direct and retroflexion views. Complications:            No immediate complications. Estimated Blood Loss:     Estimated blood loss: none. Impression:               - Small colonic polyps s/p polypectomy                           - Mild sigmoid diverticulosis.                           - Otherwise normal colonoscopy to cecum. Recommendation:           - Patient has a contact number available for                            emergencies. The signs and symptoms of potential                             delayed complications were discussed with the                            patient. Return to normal activities tomorrow.                            Written discharge instructions were provided to the                            patient.                           - High fiber diet.                           - Continue present medications.                           - Await pathology results.                           -  Repeat colonoscopy for surveillance based on                            pathology results.                           - The findings and recommendations were discussed                            with the patient's wife Elmo Putt. Jackquline Denmark, MD 12/17/2019 8:34:15 AM This report has been signed electronically.

## 2019-12-17 NOTE — Patient Instructions (Signed)
Handouts given:  Polyps, diverticulosis, hemorrhoids, HIgh fiber diet Start high fiber diet Continue present medications Await pathology results   YOU HAD AN ENDOSCOPIC PROCEDURE TODAY AT [facility]:   Refer to the procedure report that was given to you for any specific questions about what was found during the examination.  If the procedure report does not answer your questions, please call your gastroenterologist to clarify.  If you requested that your care partner not be given the details of your procedure findings, then the procedure report has been included in a sealed envelope for you to review at your convenience later.  YOU SHOULD EXPECT: Some feelings of bloating in the abdomen. Passage of more gas than usual.  Walking can help get rid of the air that was put into your GI tract during the procedure and reduce the bloating. If you had a lower endoscopy (such as a colonoscopy or flexible sigmoidoscopy) you may notice spotting of blood in your stool or on the toilet paper. If you underwent a bowel prep for your procedure, you may not have a normal bowel movement for a few days.  Please Note:  You might notice some irritation and congestion in your nose or some drainage.  This is from the oxygen used during your procedure.  There is no need for concern and it should clear up in a day or so.  SYMPTOMS TO REPORT IMMEDIATELY:   Following lower endoscopy (colonoscopy or flexible sigmoidoscopy):  Excessive amounts of blood in the stool  Significant tenderness or worsening of abdominal pains  Swelling of the abdomen that is new, acute  Fever of 100F or higher   For urgent or emergent issues, a gastroenterologist can be reached at any hour by calling [phone]. Do not use MyChart messaging for urgent concerns.    DIET:  We do recommend a small meal at first, but then you may proceed to your regular diet.  Drink plenty of fluids but you should avoid alcoholic beverages  for 24 hours.  ACTIVITY:  You should plan to take it easy for the rest of today and you should NOT DRIVE or use heavy machinery until tomorrow (because of the sedation medicines used during the test).    FOLLOW UP: Our staff will call the number listed on your records 48-72 hours following your procedure to check on you and address any questions or concerns that you may have regarding the information given to you following your procedure. If we do not reach you, we will leave a message.  We will attempt to reach you two times.  During this call, we will ask if you have developed any symptoms of COVID 19. If you develop any symptoms (ie: fever, flu-like symptoms, shortness of breath, cough etc.) before then, please call [phone].  If you test positive for Covid 19 in the 2 weeks post procedure, please call and report this information to [place].    If any biopsies were taken you will be contacted by phone or by letter within the next 1-3 weeks.  Please call us at [phone] if you have not heard about the biopsies in 3 weeks.    SIGNATURES/CONFIDENTIALITY: You and/or your care partner have signed paperwork which will be entered into your electronic medical record.  These signatures attest to the fact that that the information above on your After Visit Summary has been reviewed and is understood.  Full responsibility of the confidentiality of this discharge information lies with you  and/or your care-partner.

## 2019-12-17 NOTE — Progress Notes (Signed)
Called to room to assist during endoscopic procedure.  Patient ID and intended procedure confirmed with present staff. Received instructions for my participation in the procedure from the performing physician.  

## 2019-12-17 NOTE — Progress Notes (Signed)
Pt's states no medical or surgical changes since previsit or office visit. 

## 2019-12-17 NOTE — Progress Notes (Signed)
Report to PACU, RN, vss, BBS= Clear.  

## 2019-12-21 ENCOUNTER — Encounter: Payer: Self-pay | Admitting: Gastroenterology

## 2019-12-21 ENCOUNTER — Telehealth: Payer: Self-pay | Admitting: *Deleted

## 2019-12-21 NOTE — Telephone Encounter (Signed)
Second attempt, left VM.  

## 2019-12-21 NOTE — Telephone Encounter (Signed)
First attempt, left VM.  

## 2020-02-03 DIAGNOSIS — E1159 Type 2 diabetes mellitus with other circulatory complications: Secondary | ICD-10-CM | POA: Diagnosis not present

## 2020-02-03 DIAGNOSIS — E119 Type 2 diabetes mellitus without complications: Secondary | ICD-10-CM | POA: Diagnosis not present

## 2020-02-03 DIAGNOSIS — R972 Elevated prostate specific antigen [PSA]: Secondary | ICD-10-CM | POA: Diagnosis not present

## 2020-02-03 DIAGNOSIS — E782 Mixed hyperlipidemia: Secondary | ICD-10-CM | POA: Diagnosis not present

## 2020-02-03 DIAGNOSIS — I10 Essential (primary) hypertension: Secondary | ICD-10-CM | POA: Diagnosis not present

## 2020-02-04 LAB — COMPLETE METABOLIC PANEL WITH GFR
AG Ratio: 1.6 (calc) (ref 1.0–2.5)
ALT: 27 U/L (ref 9–46)
AST: 19 U/L (ref 10–35)
Albumin: 4.6 g/dL (ref 3.6–5.1)
Alkaline phosphatase (APISO): 95 U/L (ref 35–144)
BUN: 13 mg/dL (ref 7–25)
CO2: 29 mmol/L (ref 20–32)
Calcium: 9.8 mg/dL (ref 8.6–10.3)
Chloride: 104 mmol/L (ref 98–110)
Creat: 1.02 mg/dL (ref 0.70–1.33)
GFR, Est African American: 99 mL/min/{1.73_m2} (ref >=60)
GFR, Est Non African American: 85 mL/min/{1.73_m2} (ref >=60)
Globulin: 2.9 g/dL (calc) (ref 1.9–3.7)
Glucose, Bld: 106 mg/dL — ABNORMAL HIGH (ref 65–99)
Potassium: 4.3 mmol/L (ref 3.5–5.3)
Sodium: 141 mmol/L (ref 135–146)
Total Bilirubin: 0.4 mg/dL (ref 0.2–1.2)
Total Protein: 7.5 g/dL (ref 6.1–8.1)

## 2020-02-04 LAB — LIPID PANEL
Cholesterol: 170 mg/dL (ref <200)
HDL: 45 mg/dL (ref >=40)
LDL Cholesterol (Calc): 100 mg/dL (calc) — ABNORMAL HIGH
Non-HDL Cholesterol (Calc): 125 mg/dL (calc) (ref <130)
Total CHOL/HDL Ratio: 3.8 (calc) (ref <5.0)
Triglycerides: 152 mg/dL — ABNORMAL HIGH (ref <150)

## 2020-02-04 LAB — HEMOGLOBIN A1C
Hgb A1c MFr Bld: 6.5 % of total Hgb — ABNORMAL HIGH (ref <5.7)
Mean Plasma Glucose: 140 (calc)
eAG (mmol/L): 7.7 (calc)

## 2020-02-04 LAB — PSA: PSA: 1.88 ng/mL (ref <=4.0)

## 2020-02-04 LAB — CBC
HCT: 46.7 % (ref 38.5–50.0)
Hemoglobin: 15.1 g/dL (ref 13.2–17.1)
MCH: 27 pg (ref 27.0–33.0)
MCHC: 32.3 g/dL (ref 32.0–36.0)
MCV: 83.4 fL (ref 80.0–100.0)
MPV: 10.7 fL (ref 7.5–12.5)
Platelets: 295 10*3/uL (ref 140–400)
RBC: 5.6 10*6/uL (ref 4.20–5.80)
RDW: 15.8 % — ABNORMAL HIGH (ref 11.0–15.0)
WBC: 5 10*3/uL (ref 3.8–10.8)

## 2020-02-17 ENCOUNTER — Ambulatory Visit: Payer: BC Managed Care – PPO | Admitting: Family Medicine

## 2020-02-17 ENCOUNTER — Other Ambulatory Visit: Payer: Self-pay

## 2020-02-17 ENCOUNTER — Encounter: Payer: Self-pay | Admitting: Family Medicine

## 2020-02-17 VITALS — BP 146/79 | HR 96 | Wt 338.0 lb

## 2020-02-17 DIAGNOSIS — R1310 Dysphagia, unspecified: Secondary | ICD-10-CM | POA: Insufficient documentation

## 2020-02-17 DIAGNOSIS — R1312 Dysphagia, oropharyngeal phase: Secondary | ICD-10-CM | POA: Diagnosis not present

## 2020-02-17 DIAGNOSIS — Z23 Encounter for immunization: Secondary | ICD-10-CM

## 2020-02-17 MED ORDER — PANTOPRAZOLE SODIUM 40 MG PO TBEC: 40.0000 mg | DELAYED_RELEASE_TABLET | Freq: Every day | ORAL | 0 refills | Status: AC

## 2020-02-17 NOTE — Assessment & Plan Note (Signed)
Dysphagia only with certain meats.  Will have him try addition of protonix for a couple of weeks.  If not improving after this we discussed having him see GI.

## 2020-02-17 NOTE — Patient Instructions (Signed)
Start protonix daily.  Try eating meat after taking for about 2 weeks.  If symptoms persist let me know

## 2020-02-17 NOTE — Progress Notes (Signed)
Christopher [name] - 49 y.o. male MRN [id number]  Date of birth: 06/14/1970  Subjective Chief Complaint  Patient presents with  . digestive issue    HPI Christopher [name] is a 49 y.o. male here today with complaint of trouble eating certain meat products.  He states that when he tries to eat red meat products and sometimes chicken it causes him to cough and gag.  He doesn't really feel like this is a choking sensation.  It doesn't feel like food gets stuck.  Sometimes the sensation is immediate, other times it is a couple hours after eating.  He denies issues with with fish,  shellfish or other food products.  He has not had any noticeable heartburn symptoms.    ROS:  A comprehensive ROS was completed and negative except as noted per HPI  No Known Allergies  Past Medical History:  Diagnosis Date  . Depression   . Diabetes mellitus without complication ([place])   . Hyperlipidemia   . Hypertension   . Hypogonadal obesity   . Lumbar degenerative disc disease   . Obesity   . OSA (obstructive sleep apnea) 12/02/2013   CPAP therapy on 17 cm H2O with a Wide size Resmed Nasal Mask Mirage FX mask and heated humidification   . Post traumatic stress disorder (PTSD)   . Prediabetes 2018  . Sleep apnea    cpap    Past Surgical History:  Procedure Laterality Date  . VASECTOMY  08/2017    Social History   Socioeconomic History  . Marital status: Single    Spouse name: Not on file  . Number of children: Not on file  . Years of education: Not on file  . Highest education level: Not on file  Occupational History  . Not on file  Tobacco Use  . Smoking status: Never Smoker  . Smokeless tobacco: Never Used  Vaping Use  . Vaping Use: Never used  Substance and Sexual Activity  . Alcohol use: Yes    Comment: less than weekly  . Drug use: No  . Sexual activity: Yes    Partners: Female  Other Topics Concern  . Not on file  Social History Narrative   ** Merged History Encounter **        Social Determinants of Health   Financial Resource Strain:   . Difficulty of Paying Living Expenses: Not on file  Food Insecurity:   . Worried About Charity fundraiser in the Last Year: Not on file  . Ran Out of Food in the Last Year: Not on file  Transportation Needs:   . Lack of Transportation (Medical): Not on file  . Lack of Transportation (Non-Medical): Not on file  Physical Activity:   . Days of Exercise per Week: Not on file  . Minutes of Exercise per Session: Not on file  Stress:   . Feeling of Stress : Not on file  Social Connections:   . Frequency of Communication with Friends and Family: Not on file  . Frequency of Social Gatherings with Friends and Family: Not on file  . Attends Religious Services: Not on file  . Active Member of Clubs or Organizations: Not on file  . Attends Archivist Meetings: Not on file  . Marital Status: Not on file    Family History  Problem Relation Age of Onset  . Prostate cancer Other   . Colon cancer Neg Hx   . Colon polyps Neg Hx   . Esophageal  cancer Neg Hx   . Rectal cancer Neg Hx   . Stomach cancer Neg Hx     Health Maintenance  Topic Date Due  . Hepatitis C Screening  Never done  . HIV Screening  Never done  . OPHTHALMOLOGY EXAM  01/10/2019  . FOOT EXAM  11/12/2019  . HEMOGLOBIN A1C  08/02/2020  . TETANUS/TDAP  05/06/2022  . COLONOSCOPY  12/16/2024  . INFLUENZA VACCINE  Completed  . PNEUMOCOCCAL POLYSACCHARIDE VACCINE AGE 59-64 HIGH RISK  Completed  . COVID-19 Vaccine  Completed     ----------------------------------------------------------------------------------------------------------------------------------------------------------------------------------------------------------------- Physical Exam BP (!) 146/79 (BP Location: Right Arm, Patient Position: Sitting, Cuff Size: Large)   Pulse 96   Wt (!) 338 lb (153.3 kg)   SpO2 92%   BMI 51.39 kg/m   Physical Exam Constitutional:      Appearance:  Normal appearance.  HENT:     Head: Normocephalic and atraumatic.  Cardiovascular:     Rate and Rhythm: Normal rate and regular rhythm.  Pulmonary:     Effort: Pulmonary effort is normal.     Breath sounds: Normal breath sounds.  Abdominal:     General: Abdomen is flat. There is no distension.     Palpations: Abdomen is soft.     Tenderness: There is no abdominal tenderness.  Musculoskeletal:     Cervical back: Neck supple.  Neurological:     Mental Status: He is alert.     ------------------------------------------------------------------------------------------------------------------------------------------------------------------------------------------------------------------- Assessment and Plan  Dysphagia Dysphagia only with certain meats.  Will have him try addition of protonix for a couple of weeks.  If not improving after this we discussed having him see GI.     Meds ordered this encounter  Medications  . pantoprazole (PROTONIX) 40 MG tablet    Sig: Take 1 tablet (40 mg total) by mouth daily.    Dispense:  30 tablet    Refill:  0    No follow-ups on file.    This visit occurred during the SARS-CoV-2 public health emergency.  Safety protocols were in place, including screening questions prior to the visit, additional usage of staff PPE, and extensive cleaning of exam room while observing appropriate contact time as indicated for disinfecting solutions.

## 2020-02-28 ENCOUNTER — Other Ambulatory Visit: Payer: Self-pay

## 2020-02-28 DIAGNOSIS — E119 Type 2 diabetes mellitus without complications: Secondary | ICD-10-CM

## 2020-02-28 MED ORDER — METFORMIN HCL 500 MG PO TABS: 500.0000 mg | ORAL_TABLET | Freq: Every day | ORAL | 3 refills | Status: DC

## 2020-03-24 ENCOUNTER — Telehealth: Payer: Self-pay

## 2020-03-24 DIAGNOSIS — M5136 Other intervertebral disc degeneration, lumbar region: Secondary | ICD-10-CM

## 2020-03-24 MED ORDER — MELOXICAM 15 MG PO TABS: ORAL_TABLET | ORAL | 0 refills | Status: DC

## 2020-03-24 NOTE — Telephone Encounter (Signed)
Pt called requesting meloxicam refill for hip pain.   Advised patient refill being sent for 30 days only.   Per Christopher [name] note he is to follow-up with Dr. Darene Lamer concerning his hip pain.   This was reiterated to Christopher [name]. He has 12/9 appt with Christopher [name] and wants to be reevaluated at that time concerning pursuing additional care with Dr. Darene Lamer.

## 2020-04-13 ENCOUNTER — Encounter: Payer: Self-pay | Admitting: Family Medicine

## 2020-04-13 ENCOUNTER — Ambulatory Visit (INDEPENDENT_AMBULATORY_CARE_PROVIDER_SITE_OTHER): Payer: BC Managed Care – PPO | Admitting: Family Medicine

## 2020-04-13 VITALS — BP 133/82 | HR 88 | Wt 343.0 lb

## 2020-04-13 DIAGNOSIS — E1159 Type 2 diabetes mellitus with other circulatory complications: Secondary | ICD-10-CM | POA: Diagnosis not present

## 2020-04-13 DIAGNOSIS — E119 Type 2 diabetes mellitus without complications: Secondary | ICD-10-CM

## 2020-04-13 DIAGNOSIS — I152 Hypertension secondary to endocrine disorders: Secondary | ICD-10-CM | POA: Diagnosis not present

## 2020-04-13 LAB — POCT GLYCOSYLATED HEMOGLOBIN (HGB A1C): Hemoglobin A1C: 6.5 % — AB (ref 4.0–5.6)

## 2020-04-13 MED ORDER — OZEMPIC (0.25 OR 0.5 MG/DOSE) 2 MG/1.5ML ~~LOC~~ SOPN: PEN_INJECTOR | SUBCUTANEOUS | 1 refills | Status: DC

## 2020-04-13 MED ORDER — NOVOFINE PEN NEEDLE 32G X 6 MM MISC: 1 refills | Status: DC

## 2020-04-13 NOTE — Assessment & Plan Note (Signed)
His blood pressure remains pretty well controlled at this time.  He will continue valsartan/hydrochlorothiazide at current strength.  Encouraged to follow a low-sodium diet.

## 2020-04-13 NOTE — Assessment & Plan Note (Signed)
Diabetes remains fairly well controlled however we will go ahead and add Ozempic to help with continued improvement of his blood sugars as well as help with weight loss.  He is instructed on use of Ozempic.  Potential side effects reviewed.

## 2020-04-13 NOTE — Progress Notes (Signed)
Christopher [name] - 49 y.o. male MRN [id number]  Date of birth: [birth year]  Subjective Chief Complaint  Patient presents with  . Diabetes    HPI Christopher [name] is a 49 y.o. male here today for follow-up of hypertension, diabetes and hip pain.  He reports that hip pain really has not improved with meloxicam he continues to have locking sensation of his hip.  He plans to schedule a follow-up with Dr. Dianah Field for this.  His hypertension is managed with valsartan/hydrochlorothiazide.  He continues to tolerate this well without significant side effects.  He has not had any symptoms related to hypertension including chest pain, shortness of breath, palpitations, headache or vision changes.  Diabetes is currently treated with Metformin.  He is doing well with this and denies any significant side effects.  He is frustrated with continued weight gain.  He has made dietary changes.  Blood sugars at home remain fairly well controlled.    ROS:  A comprehensive ROS was completed and negative except as noted per HPI  No Known Allergies  Past Medical History:  Diagnosis Date  . Depression   . Diabetes mellitus without complication ([place])   . Hyperlipidemia   . Hypertension   . Hypogonadal obesity   . Lumbar degenerative disc disease   . Obesity   . OSA (obstructive sleep apnea) 12/02/2013   CPAP therapy on 17 cm H2O with a Wide size Resmed Nasal Mask Mirage FX mask and heated humidification   . Post traumatic stress disorder (PTSD)   . Prediabetes 2018  . Sleep apnea    cpap    Past Surgical History:  Procedure Laterality Date  . VASECTOMY  08/2017    Social History   Socioeconomic History  . Marital status: Single    Spouse name: Not on file  . Number of children: Not on file  . Years of education: Not on file  . Highest education level: Not on file  Occupational History  . Not on file  Tobacco Use  . Smoking status: Never Smoker  . Smokeless tobacco: Never Used  Vaping Use   . Vaping Use: Never used  Substance and Sexual Activity  . Alcohol use: Yes    Comment: less than weekly  . Drug use: No  . Sexual activity: Yes    Partners: Female  Other Topics Concern  . Not on file  Social History Narrative   ** Merged History Encounter **       Social Determinants of Health   Financial Resource Strain: Not on file  Food Insecurity: Not on file  Transportation Needs: Not on file  Physical Activity: Not on file  Stress: Not on file  Social Connections: Not on file    Family History  Problem Relation Age of Onset  . Prostate cancer Other   . Colon cancer Neg Hx   . Colon polyps Neg Hx   . Esophageal cancer Neg Hx   . Rectal cancer Neg Hx   . Stomach cancer Neg Hx     Health Maintenance  Topic Date Due  . Hepatitis C Screening  Never done  . HIV Screening  Never done  . OPHTHALMOLOGY EXAM  01/10/2019  . FOOT EXAM  11/12/2019  . COVID-19 Vaccine (3 - Booster for Pfizer series) 01/29/2020  . HEMOGLOBIN A1C  10/12/2020  . TETANUS/TDAP  05/06/2022  . COLONOSCOPY  12/16/2024  . INFLUENZA VACCINE  Completed  . PNEUMOCOCCAL POLYSACCHARIDE VACCINE AGE 16-64 HIGH RISK  Completed     -----------------------------------------------------------------------------------------------------------------------------------------------------------------------------------------------------------------  Physical Exam BP 133/82 (BP Location: Left Arm, Patient Position: Sitting, Cuff Size: Large)   Pulse 88   Wt (!) 343 lb (155.6 kg)   SpO2 95%   BMI 52.15 kg/m   Physical Exam Constitutional:      Appearance: Normal appearance.  HENT:     Head: Normocephalic and atraumatic.     Mouth/Throat:     Mouth: Mucous membranes are moist.  Eyes:     General: No scleral icterus. Cardiovascular:     Rate and Rhythm: Normal rate and regular rhythm.  Pulmonary:     Effort: Pulmonary effort is normal.     Breath sounds: Normal breath sounds.  Musculoskeletal:      Cervical back: Neck supple.  Neurological:     General: No focal deficit present.     Mental Status: He is alert.  Psychiatric:        Mood and Affect: Mood normal.        Behavior: Behavior normal.     ------------------------------------------------------------------------------------------------------------------------------------------------------------------------------------------------------------------- Assessment and Plan  Hypertension associated with diabetes ([place]) His blood pressure remains pretty well controlled at this time.  He will continue valsartan/hydrochlorothiazide at current strength.  Encouraged to follow a low-sodium diet.  Controlled type 2 diabetes mellitus without complication, without long-term current use of insulin ([place]) Diabetes remains fairly well controlled however we will go ahead and add Ozempic to help with continued improvement of his blood sugars as well as help with weight loss.  He is instructed on use of Ozempic.  Potential side effects reviewed.   Meds ordered this encounter  Medications  . Semaglutide,0.25 or 0.5MG /DOS, (OZEMPIC, 0.25 OR 0.5 MG/DOSE,) 2 MG/1.5ML SOPN    Sig: Take 0.25mg  weekly x4 weeks then increase to 0.5mg  weekly    Dispense:  1.5 mL    Refill:  1  . Insulin Pen Needle (NOVOFINE PEN NEEDLE) 32G X 6 MM MISC    Sig: Use to inject Ozempic weekly    Dispense:  50 each    Refill:  1    Return in about 6 months (around 10/12/2020) for HTN/DM.    This visit occurred during the SARS-CoV-2 public health emergency.  Safety protocols were in place, including screening questions prior to the visit, additional usage of staff PPE, and extensive cleaning of exam room while observing appropriate contact time as indicated for disinfecting solutions.

## 2020-04-13 NOTE — Patient Instructions (Signed)
Great to see you today! Continue current medications.  I am adding Ozempic to help with weight loss as well as blood sugar control Schedule and appointment with Dr. Darene Lamer for follow up of the hip

## 2020-05-11 ENCOUNTER — Other Ambulatory Visit: Payer: Self-pay

## 2020-05-11 ENCOUNTER — Ambulatory Visit (INDEPENDENT_AMBULATORY_CARE_PROVIDER_SITE_OTHER): Payer: Managed Care, Other (non HMO)

## 2020-05-11 ENCOUNTER — Ambulatory Visit (INDEPENDENT_AMBULATORY_CARE_PROVIDER_SITE_OTHER): Payer: Managed Care, Other (non HMO) | Admitting: Sports Medicine

## 2020-05-11 DIAGNOSIS — I152 Hypertension secondary to endocrine disorders: Secondary | ICD-10-CM

## 2020-05-11 DIAGNOSIS — M160 Bilateral primary osteoarthritis of hip: Secondary | ICD-10-CM

## 2020-05-11 DIAGNOSIS — E1159 Type 2 diabetes mellitus with other circulatory complications: Secondary | ICD-10-CM

## 2020-05-11 DIAGNOSIS — M5136 Other intervertebral disc degeneration, lumbar region: Secondary | ICD-10-CM

## 2020-05-11 DIAGNOSIS — E119 Type 2 diabetes mellitus without complications: Secondary | ICD-10-CM

## 2020-05-11 MED ORDER — ATORVASTATIN CALCIUM 20 MG PO TABS: 20.0000 mg | ORAL_TABLET | Freq: Every day | ORAL | 3 refills | Status: DC

## 2020-05-11 MED ORDER — OZEMPIC (0.25 OR 0.5 MG/DOSE) 2 MG/1.5ML ~~LOC~~ SOPN: PEN_INJECTOR | SUBCUTANEOUS | 1 refills | Status: DC

## 2020-05-11 MED ORDER — METFORMIN HCL 500 MG PO TABS: 500.0000 mg | ORAL_TABLET | Freq: Every day | ORAL | 3 refills | Status: DC

## 2020-05-11 MED ORDER — VALSARTAN-HYDROCHLOROTHIAZIDE 320-12.5 MG PO TABS: 1.0000 | ORAL_TABLET | Freq: Every day | ORAL | 3 refills | Status: DC

## 2020-05-11 NOTE — Telephone Encounter (Signed)
Patient reports that he is getting ready to go up on the dose of the Ozempic and doesn'[t want to run out of medication..  Please make sure there are enough pens per patient.

## 2020-05-11 NOTE — Assessment & Plan Note (Signed)
Unfortunately has not noted improvement with meloxicam. Today we performed a left hip joint injection, getting updated x-rays. We have also discussed aggressive forms of weight loss including gastric sleeve surgery. Home rehab exercises given. Return to see me in a month.

## 2020-05-11 NOTE — Progress Notes (Signed)
    Procedures performed today:    Procedure: Real-time Ultrasound Guided injection of the left hip joint Device: Samsung HS60  Verbal informed consent obtained.  Time-out conducted.  Noted no overlying erythema, induration, or other signs of local infection.  Skin prepped in a sterile fashion.  Local anesthesia: Topical Ethyl chloride.  With sterile technique and under real time ultrasound guidance: 1 cc Kenalog 40, 2 cc lidocaine, 2 cc bupivacaine injected easily Completed without difficulty  Advised to call if fevers/chills, erythema, induration, drainage, or persistent bleeding.  Images permanently stored and available for review in PACS.  Impression: Technically successful ultrasound guided injection.  Independent interpretation of notes and tests performed by another provider:   X-rays from several years ago reviewed, he does have mild bilateral hip osteoarthritis.  Brief History, Exam, Impression, and Recommendations:    Primary osteoarthritis of both hips Unfortunately has not noted improvement with meloxicam. Today we performed a left hip joint injection, getting updated x-rays. We have also discussed aggressive forms of weight loss including gastric sleeve surgery. Home rehab exercises given. Return to see me in a month.    ___________________________________________ Ihor Austin. Benjamin Stain, M.D., ABFM., CAQSM. Primary Care and Sports Medicine [facility]  Adjunct Instructor of Family Medicine  [employer]

## 2020-06-08 ENCOUNTER — Other Ambulatory Visit: Payer: Self-pay

## 2020-06-08 ENCOUNTER — Ambulatory Visit: Payer: Managed Care, Other (non HMO) | Admitting: Sports Medicine

## 2020-06-08 DIAGNOSIS — M160 Bilateral primary osteoarthritis of hip: Secondary | ICD-10-CM | POA: Diagnosis not present

## 2020-06-08 NOTE — Assessment & Plan Note (Signed)
Christopher [name] returns, he is a pleasant 50 year old male, at the last visit I did a left hip joint injection, he returns today pain-free, he is also started the process for bariatric surgery. Return to see me as needed.

## 2020-06-08 NOTE — Progress Notes (Signed)
    Procedures performed today:    None.  Independent interpretation of notes and tests performed by another provider:   None.  Brief History, Exam, Impression, and Recommendations:    Primary osteoarthritis of both hips Christopher [name], he is a pleasant 50 year old male, at the last visit I did a left hip joint injection, he [name] today pain-free, he is also started the process for bariatric surgery. Return to see me as needed.    ___________________________________________ Gwen Her. Dianah Field, M.D., ABFM., CAQSM. Primary Care and [place] Instructor of [employer]

## 2020-06-12 ENCOUNTER — Encounter: Payer: Self-pay | Admitting: Family Medicine

## 2020-06-12 ENCOUNTER — Ambulatory Visit: Payer: Managed Care, Other (non HMO) | Admitting: Family Medicine

## 2020-06-12 ENCOUNTER — Other Ambulatory Visit: Payer: Self-pay

## 2020-06-12 ENCOUNTER — Ambulatory Visit (INDEPENDENT_AMBULATORY_CARE_PROVIDER_SITE_OTHER): Payer: Managed Care, Other (non HMO)

## 2020-06-12 VITALS — BP 158/93 | HR 74 | Wt 343.0 lb

## 2020-06-12 DIAGNOSIS — M25521 Pain in right elbow: Secondary | ICD-10-CM | POA: Diagnosis not present

## 2020-06-12 DIAGNOSIS — W19XXXA Unspecified fall, initial encounter: Secondary | ICD-10-CM | POA: Diagnosis not present

## 2020-06-12 DIAGNOSIS — W009XXA Unspecified fall due to ice and snow, initial encounter: Secondary | ICD-10-CM | POA: Diagnosis not present

## 2020-06-12 DIAGNOSIS — F339 Major depressive disorder, recurrent, unspecified: Secondary | ICD-10-CM | POA: Insufficient documentation

## 2020-06-12 DIAGNOSIS — R2231 Localized swelling, mass and lump, right upper limb: Secondary | ICD-10-CM

## 2020-06-12 NOTE — Patient Instructions (Signed)
Try stretches Add on meloxicam to replace ibuprofen.  You can also try voltaren gel Try adding tennis elbow brace:

## 2020-06-12 NOTE — Assessment & Plan Note (Addendum)
TTP along the olecranon itself.  Will get xray of this area.  I think his main issues if more of a lateral epicondylitis.  Discussed using topical voltaren and application of ice to area.  He can try counterforce brace as well.  He will see how he does with work over the next couple of days.  Can consider addition of PT and/or having him see Dr. Dianah Field if symptoms persist.

## 2020-06-12 NOTE — Progress Notes (Signed)
Christopher [name] - 50 y.o. male MRN [id number]  Date of birth: April 11, 1971  Subjective Chief Complaint  Patient presents with  . Fall    HPI Christopher [name] is a 50 y.o. male here today with complaint of R arm and elbow pain.  Reports having fall on ice at work with resultant elbow pain.  This occurred about 3 weeks ago.   States he was seen by orthopedic office under workers comp.  Was told to take ibuprofen.  No imaging completed.  He reports that pain is a little better but has not fully improved.  He continues to have pain with flexion/extension of the elbow.  He also has pain along the forearm.  He denies weakness/numbness/tingling.  He was told he can return back to work tomorrow but he isn't sure due to ongoing pain.   ROS:  A comprehensive ROS was completed and negative except as noted per HPI  No Known Allergies  Past Medical History:  Diagnosis Date  . Depression   . Diabetes mellitus without complication ([place])   . Hyperlipidemia   . Hypertension   . Hypogonadal obesity   . Lumbar degenerative disc disease   . Obesity   . OSA (obstructive sleep apnea) 12/02/2013   CPAP therapy on 17 cm H2O with a Wide size Resmed Nasal Mask Mirage FX mask and heated humidification   . Post traumatic stress disorder (PTSD)   . Prediabetes 2018  . Sleep apnea    cpap    Past Surgical History:  Procedure Laterality Date  . VASECTOMY  08/2017    Social History   Socioeconomic History  . Marital status: Single    Spouse name: Not on file  . Number of children: Not on file  . Years of education: Not on file  . Highest education level: Not on file  Occupational History  . Not on file  Tobacco Use  . Smoking status: Never Smoker  . Smokeless tobacco: Never Used  Vaping Use  . Vaping Use: Never used  Substance and Sexual Activity  . Alcohol use: Yes    Comment: less than weekly  . Drug use: No  . Sexual activity: Yes    Partners: Female  Other Topics Concern  . Not on file   Social History Narrative   ** Merged History Encounter **       Social Determinants of Health   Financial Resource Strain: Not on file  Food Insecurity: Not on file  Transportation Needs: Not on file  Physical Activity: Not on file  Stress: Not on file  Social Connections: Not on file    Family History  Problem Relation Age of Onset  . Prostate cancer Other   . Colon cancer Neg Hx   . Colon polyps Neg Hx   . Esophageal cancer Neg Hx   . Rectal cancer Neg Hx   . Stomach cancer Neg Hx     Health Maintenance  Topic Date Due  . Hepatitis C Screening  Never done  . HIV Screening  Never done  . OPHTHALMOLOGY EXAM  01/10/2019  . FOOT EXAM  11/12/2019  . COVID-19 Vaccine (3 - Booster for Pfizer series) 01/29/2020  . HEMOGLOBIN A1C  10/12/2020  . TETANUS/TDAP  08/10/2024  . COLONOSCOPY (Pts 45-51yrs Insurance coverage will need to be confirmed)  12/16/2024  . INFLUENZA VACCINE  Completed  . PNEUMOCOCCAL POLYSACCHARIDE VACCINE AGE 85-64 HIGH RISK  Completed     ----------------------------------------------------------------------------------------------------------------------------------------------------------------------------------------------------------------- Physical Exam BP (!) 158/93 (  BP Location: Left Arm, Patient Position: Sitting, Cuff Size: Large)   Pulse 74   Wt (!) 343 lb (155.6 kg)   SpO2 94%   BMI 52.15 kg/m   Physical Exam Constitutional:      Appearance: Normal appearance.  Eyes:     General: No scleral icterus. Musculoskeletal:     Comments: TTP along olecranon with pain on flexion and extension.  No swelling noted.  He also has pain along extensors of the R proximal forearm.  Pain with resisted wrist extension and resisted supination.    Skin:    General: Skin is warm and dry.  Neurological:     General: No focal deficit present.     Mental Status: He is alert.  Psychiatric:        Mood and Affect: Mood normal.        Behavior: Behavior  normal.     ------------------------------------------------------------------------------------------------------------------------------------------------------------------------------------------------------------------- Assessment and Plan  Right elbow pain TTP along the olecranon itself.  Will get xray of this area.  I think his main issues if more of a lateral epicondylitis.  Discussed using topical voltaren and application of ice to area.  He can try counterforce brace as well.  He will see how he does with work over the next couple of days.  Can consider addition of PT and/or having him see Dr. Dianah Field if symptoms persist.     No orders of the defined types were placed in this encounter.   No follow-ups on file.    This visit occurred during the SARS-CoV-2 public health emergency.  Safety protocols were in place, including screening questions prior to the visit, additional usage of staff PPE, and extensive cleaning of exam room while observing appropriate contact time as indicated for disinfecting solutions.

## 2020-06-16 ENCOUNTER — Ambulatory Visit: Payer: Managed Care, Other (non HMO) | Admitting: Family Medicine

## 2020-06-22 ENCOUNTER — Other Ambulatory Visit: Payer: Self-pay

## 2020-06-22 MED ORDER — OZEMPIC (0.25 OR 0.5 MG/DOSE) 2 MG/1.5ML ~~LOC~~ SOPN: PEN_INJECTOR | SUBCUTANEOUS | 1 refills | Status: DC

## 2020-06-23 ENCOUNTER — Ambulatory Visit: Payer: Managed Care, Other (non HMO) | Admitting: Family Medicine

## 2020-06-23 ENCOUNTER — Other Ambulatory Visit: Payer: Self-pay

## 2020-06-23 ENCOUNTER — Encounter: Payer: Self-pay | Admitting: Family Medicine

## 2020-06-23 DIAGNOSIS — E119 Type 2 diabetes mellitus without complications: Secondary | ICD-10-CM

## 2020-06-23 DIAGNOSIS — E1159 Type 2 diabetes mellitus with other circulatory complications: Secondary | ICD-10-CM | POA: Diagnosis not present

## 2020-06-23 DIAGNOSIS — E1169 Type 2 diabetes mellitus with other specified complication: Secondary | ICD-10-CM | POA: Diagnosis not present

## 2020-06-23 DIAGNOSIS — E785 Hyperlipidemia, unspecified: Secondary | ICD-10-CM

## 2020-06-23 DIAGNOSIS — I152 Hypertension secondary to endocrine disorders: Secondary | ICD-10-CM

## 2020-06-23 MED ORDER — TRULICITY 0.75 MG/0.5ML ~~LOC~~ SOAJ: SUBCUTANEOUS | 0 refills | Status: DC

## 2020-06-23 MED ORDER — TRULICITY 1.5 MG/0.5ML ~~LOC~~ SOAJ: SUBCUTANEOUS | 1 refills | Status: DC

## 2020-06-23 NOTE — Patient Instructions (Addendum)
[place] with [facility] Address: [address], [place], [place] [address] Hours:  Open ?Closes 5PM Phone: [phone]  Trulicity savings:  [email or web address]

## 2020-06-25 NOTE — Assessment & Plan Note (Signed)
Most recent A1c of  Lab Results  Component Value Date   HGBA1C 6.5 (A) 04/13/2020   indicates diabetes is well controlled.  Unfortunately Ozempic was not covered. Will add trulicity weekly.  Counseled on healthy, low carb diet and recommend frequent activity to help with maintaining good control of blood sugars.

## 2020-06-25 NOTE — Assessment & Plan Note (Signed)
Lab Results  Component Value Date   LDLCALC 100 (H) 02/03/2020  Continue atorvastatin.

## 2020-06-25 NOTE — Assessment & Plan Note (Signed)
Blood pressure is at goal at for age and co-morbidities.  I recommend continuation of current medications.  In addition they were instructed to follow a low sodium diet with regular exercise to help to maintain adequate control of blood pressure.  ? ?

## 2020-06-25 NOTE — Assessment & Plan Note (Signed)
He is looking into weight loss surgery which I think may be a good option for him with his multiple co-morbidities. He will compare programs offered through [facility].

## 2020-06-25 NOTE — Progress Notes (Signed)
Christopher [name] - 50 y.o. male MRN [id number]  Date of birth: 1970/11/05  Subjective Chief Complaint  Patient presents with  . Follow-up    HPI Christopher [name] is a 50 y.o. male here today for follow up of HTN, T2DM and HLD.   Diabetes currently managed with metformin.  Rx for ozempic sent in as well but not covered as trulicity is preferred.  He does check blood sugars once in a while.  No side effects noted from current medications.   HTN managed with diovan-hct.  He is toleraitng this wlel and denies side effects.  He has not had chest pain, shortness of breath, palpitations, headache or vision changes.  Compliant with CPAP therapy.    HLD managed with atorvastatin, denies myalgias.   Considering weight loss surgeries due to multiple co-morbidities associated with his obesity.   ROS:  A comprehensive ROS was completed and negative except as noted per HPI   No Known Allergies  Past Medical History:  Diagnosis Date  . Depression   . Diabetes mellitus without complication ([place])   . Hyperlipidemia   . Hypertension   . Hypogonadal obesity   . Lumbar degenerative disc disease   . Obesity   . OSA (obstructive sleep apnea) 12/02/2013   CPAP therapy on 17 cm H2O with a Wide size Resmed Nasal Mask Mirage FX mask and heated humidification   . Post traumatic stress disorder (PTSD)   . Prediabetes 2018  . Sleep apnea    cpap    Past Surgical History:  Procedure Laterality Date  . VASECTOMY  08/2017    Social History   Socioeconomic History  . Marital status: Single    Spouse name: Not on file  . Number of children: Not on file  . Years of education: Not on file  . Highest education level: Not on file  Occupational History  . Not on file  Tobacco Use  . Smoking status: Never Smoker  . Smokeless tobacco: Never Used  Vaping Use  . Vaping Use: Never used  Substance and Sexual Activity  . Alcohol use: Yes    Comment: less than weekly  . Drug use: No  . Sexual activity:  Yes    Partners: Female  Other Topics Concern  . Not on file  Social History Narrative   ** Merged History Encounter **       Social Determinants of Health   Financial Resource Strain: Not on file  Food Insecurity: Not on file  Transportation Needs: Not on file  Physical Activity: Not on file  Stress: Not on file  Social Connections: Not on file    Family History  Problem Relation Age of Onset  . Prostate cancer Other   . Colon cancer Neg Hx   . Colon polyps Neg Hx   . Esophageal cancer Neg Hx   . Rectal cancer Neg Hx   . Stomach cancer Neg Hx     Health Maintenance  Topic Date Due  . Hepatitis C Screening  Never done  . HIV Screening  Never done  . OPHTHALMOLOGY EXAM  01/10/2019  . FOOT EXAM  11/12/2019  . COVID-19 Vaccine (3 - Booster for Pfizer series) 01/29/2020  . HEMOGLOBIN A1C  10/12/2020  . TETANUS/TDAP  08/10/2024  . COLONOSCOPY (Pts 45-51yrs Insurance coverage will need to be confirmed)  12/16/2024  . INFLUENZA VACCINE  Completed  . PNEUMOCOCCAL POLYSACCHARIDE VACCINE AGE 59-64 HIGH RISK  Completed     ----------------------------------------------------------------------------------------------------------------------------------------------------------------------------------------------------------------- Physical Exam  BP 135/78 (BP Location: Left Arm, Patient Position: Sitting, Cuff Size: Large)   Pulse 74   Temp 97.8 F (36.6 C)   Wt (!) 348 lb 3.2 oz (157.9 kg)   SpO2 96%   BMI 52.94 kg/m   Physical Exam Constitutional:      Appearance: Normal appearance.  HENT:     Head: Normocephalic and atraumatic.  Eyes:     General: No scleral icterus. Cardiovascular:     Rate and Rhythm: Normal rate and regular rhythm.  Pulmonary:     Effort: Pulmonary effort is normal.     Breath sounds: Normal breath sounds.  Musculoskeletal:     Cervical back: Neck supple.  Neurological:     General: No focal deficit present.     Mental Status: He is alert.   Psychiatric:        Mood and Affect: Mood normal.        Behavior: Behavior normal.     ------------------------------------------------------------------------------------------------------------------------------------------------------------------------------------------------------------------- Assessment and Plan  Hypertension associated with diabetes ([place]) Blood pressure is at goal at for age and co-morbidities.  I recommend continuation of current medications.  In addition they were instructed to follow a low sodium diet with regular exercise to help to maintain adequate control of blood pressure.    Controlled type 2 diabetes mellitus without complication, without long-term current use of insulin ([place]) Most recent A1c of  Lab Results  Component Value Date   HGBA1C 6.5 (A) 04/13/2020   indicates diabetes is well controlled.  Unfortunately Ozempic was not covered. Will add trulicity weekly.  Counseled on healthy, low carb diet and recommend frequent activity to help with maintaining good control of blood sugars.    Class 3 severe obesity due to excess calories with serious comorbidity in adult [facility]) He is looking into weight loss surgery which I think may be a good option for him with his multiple co-morbidities. He will compare programs offered through [facility].    Hyperlipidemia associated with type 2 diabetes mellitus ([place]) Lab Results  Component Value Date   LDLCALC 100 (H) 02/03/2020  Continue atorvastatin.    Meds ordered this encounter  Medications  . Dulaglutide (TRULICITY) 3.56 YS/1.6OH SOPN    Sig: Inject 0.75mg  once weekly x4 weeks then increase to 1.5mg .    Dispense:  2 mL    Refill:  0  . Dulaglutide (TRULICITY) 1.5 FG/9.0SX SOPN    Sig: Inject 1.5mg  weekly after completion of first four weeks of 0.75mg     Dispense:  6 mL    Refill:  1    No follow-ups on file.    This visit occurred during the SARS-CoV-2 public health emergency.  Safety  protocols were in place, including screening questions prior to the visit, additional usage of staff PPE, and extensive cleaning of exam room while observing appropriate contact time as indicated for disinfecting solutions.

## 2020-07-13 ENCOUNTER — Telehealth: Payer: Self-pay

## 2020-07-13 NOTE — Telephone Encounter (Signed)
Patient lvm stating at home test for COVID +.  Requesting next steps and possible treatments.

## 2020-07-13 NOTE — Telephone Encounter (Signed)
Recommend virtual visit to discuss.

## 2020-07-13 NOTE — Telephone Encounter (Signed)
Please call TODAY and schedule the pt for a virtual appt tomorrow with whomever is available.Marland KitchenMarland KitchenMarland KitchenMatthews's 1st.

## 2020-07-14 ENCOUNTER — Telehealth (INDEPENDENT_AMBULATORY_CARE_PROVIDER_SITE_OTHER): Payer: Managed Care, Other (non HMO) | Admitting: Family Medicine

## 2020-07-14 ENCOUNTER — Encounter: Payer: Self-pay | Admitting: Family Medicine

## 2020-07-14 DIAGNOSIS — U071 COVID-19: Secondary | ICD-10-CM | POA: Diagnosis not present

## 2020-07-14 MED ORDER — FLUTICASONE PROPIONATE 50 MCG/ACT NA SUSP: 2.0000 | Freq: Every day | NASAL | 0 refills | Status: DC

## 2020-07-14 NOTE — Telephone Encounter (Signed)
Patient scheduled Virtually with Caleen Jobs for this morning at 11am. AM

## 2020-07-14 NOTE — Patient Instructions (Signed)
Over the counter medications that may be helpful for symptoms:  . Guaifenesin 1200 mg extended release tabs twice daily, with plenty of water o For cough and congestion o Brand name: Mucinex   . Pseudoephedrine 30 mg, one or two tabs every 4 to 6 hours o For sinus congestion o Brand name: Sudafed o You must get this from the pharmacy counter.  . Oxymetazoline nasal spray each morning, one spray in each nostril, for NO MORE THAN 3 days  o For nasal and sinus congestion o Brand name: Afrin . Saline nasal spray or Saline Nasal Irrigation 3-5 times a day o For nasal and sinus congestion o Brand names: Ocean or AYR . Fluticasone nasal spray, one spray in each nostril, each morning (after oxymetazoline and saline, if used) o For nasal and sinus congestion o Brand name: Flonase . Warm salt water gargles  o For sore throat o Every few hours as needed . Alternate ibuprofen 400-600 mg and acetaminophen 1000 mg every 4-6 hours o For fever, body aches, headache o Brand names: Motrin or Advil and Tylenol . Dextromethorphan 12-hour cough version 30 mg every 12 hours  o For cough o Brand name: Delsym Stop all other cold medications for now (Nyquil, Dayquil, Tylenol Cold, Theraflu, etc) and other non-prescription cough/cold preparations. Many of these have the same ingredients listed above and could cause an overdose of medication.   Herbal treatments that have been shown to be helpful in some patients include: Vitamin C 1000mg per day Vitamin D 4000iU per day Zinc 100mg per day Quercetin 25-500mg twice a day Melatonin 5-10mg at bedtime  General Instructions . Allow your body to rest . Drink PLENTY of fluids . Isolate yourself from everyone, even family, until test results have returned  If your COVID-19 test is positive . Then you ARE INFECTED and you can pass the virus to others . You must quarantine from others for a minimum of  o 10 days since symptoms started AND o You are fever  free for 24 hours WITHOUT any medication to reduce fever AND o Your symptoms are improving . Do not go to the store or other public areas . Do not go around household members who are not known to be infected with COVID-19 . If you MUST leave your area of quarantine (example: go to a bathroom you share with others in your home), you must o Wear a mask o Wash your hands thoroughly o Wipe down any surfaces you touch . Do not share food, drinks, towels, or other items with other persons . Dispose of your own tissues, food containers, etc  Once you have recovered, please continue good preventive care measures, including:  . wearing a mask when in public . wash your hands frequently . avoid touching your face/nose/eyes . cover coughs/sneezes with the inside of your elbow . stay out of crowds . keep a 6 foot distance from others  If you develop severe shortness of breath, uncontrolled fevers, coughing up blood, confusion, chest pain, or signs of dehydration (such as significantly decreased urine amounts or dizziness with standing) please go to the ER.  

## 2020-07-14 NOTE — Progress Notes (Signed)
Virtual Video Visit via MyChart Note  I connected with  Christopher [name] on 07/14/20 at 11:10 AM EST by the video enabled telemedicine application for MyChart, and verified that I am speaking with the correct person using two identifiers.   I introduced myself as a Designer, jewellery with the practice. We discussed the limitations of evaluation and management by telemedicine and the availability of in person appointments. The patient expressed understanding and agreed to proceed.  Participating parties in this visit include: The patient and the nurse practitioner listed.  The patient is: At home I am: In the office - [facility]  Subjective:    CC: No chief complaint on file.   HPI: Christopher [name] is a 50 y.o. year old male presenting today via [facility] today for URI symptoms.  Patient reports symptoms began yesterday and included fever 100.3 F with chills, body aches, cough, decreased appetite. He took a home COVID test which was positive and then went to a [facility] and had a positive PCR there as well. He reports his worst symptom right now is 6/10 body aches and the fever/chills. Unsure where he got it from, but works for the [employer]. His household members have found somewhere else to stay while he is quarantined. So far he has started taking Vitamins A, B, and C and zinc. He scheduled today's appointment to see what else he should be taking. He denies chest pain, shortness of breath, GI symptoms, loss of taste/smell.     Past medical history, Surgical history, Family history not pertinant except as noted below, Social history, Allergies, and medications have been entered into the medical record, reviewed, and corrections made.   Review of Systems:  All review of systems negative except what is listed in the HPI   Objective:    General:  Speaking clearly in complete sentences. Absent shortness of breath noted.   Alert and oriented x3.    Normal judgment.  Absent acute distress. Patient appears to have very mild symptoms as far as can be noted on video. Appears stable.   Impression and Recommendations:    1. COVID-19 Patient with mild symptoms so far. Recommend conservative measures for now - Flonase, Mucinex, Coricidin, Tylenol, vitamins, rest, hydration, humidifier use. He has a pulse oximeter and is able to keep an eye on his O2 sats - educated on warning signs and symptoms that would require he go to the ED. Attaching other OTC options, quarantine guidelines, warning signs, and work note to [employer]. Patient without any other questions or complaints at this time.  - fluticasone (FLONASE) 50 MCG/ACT nasal spray; Place 2 sprays into both nostrils daily.  Dispense: 1 g; Refill: 0  Follow-up if symptoms worsen or fail to improve.    I discussed the assessment and treatment plan with the patient. The patient was provided an opportunity to ask questions and all were answered. The patient agreed with the plan and demonstrated an understanding of the instructions.   The patient was advised to call back or seek an in-person evaluation if the symptoms worsen or if the condition fails to improve as anticipated.  I provided 20 minutes of non-face-to-face interaction with this [place] visit including intake, same-day documentation, and chart review.   Terrilyn Saver, NP

## 2020-08-02 DIAGNOSIS — K219 Gastro-esophageal reflux disease without esophagitis: Secondary | ICD-10-CM | POA: Insufficient documentation

## 2020-10-12 ENCOUNTER — Other Ambulatory Visit: Payer: Self-pay

## 2020-10-12 ENCOUNTER — Ambulatory Visit (INDEPENDENT_AMBULATORY_CARE_PROVIDER_SITE_OTHER): Payer: Managed Care, Other (non HMO) | Admitting: Family Medicine

## 2020-10-12 ENCOUNTER — Encounter: Payer: Self-pay | Admitting: Family Medicine

## 2020-10-12 ENCOUNTER — Ambulatory Visit: Payer: BC Managed Care – PPO | Admitting: Family Medicine

## 2020-10-12 VITALS — BP 145/83 | HR 91 | Ht 68.0 in | Wt 337.0 lb

## 2020-10-12 DIAGNOSIS — E785 Hyperlipidemia, unspecified: Secondary | ICD-10-CM

## 2020-10-12 DIAGNOSIS — E1159 Type 2 diabetes mellitus with other circulatory complications: Secondary | ICD-10-CM | POA: Diagnosis not present

## 2020-10-12 DIAGNOSIS — M5136 Other intervertebral disc degeneration, lumbar region: Secondary | ICD-10-CM | POA: Diagnosis not present

## 2020-10-12 DIAGNOSIS — I152 Hypertension secondary to endocrine disorders: Secondary | ICD-10-CM

## 2020-10-12 DIAGNOSIS — E1169 Type 2 diabetes mellitus with other specified complication: Secondary | ICD-10-CM

## 2020-10-12 DIAGNOSIS — E119 Type 2 diabetes mellitus without complications: Secondary | ICD-10-CM

## 2020-10-12 DIAGNOSIS — Z23 Encounter for immunization: Secondary | ICD-10-CM

## 2020-10-12 LAB — POCT GLYCOSYLATED HEMOGLOBIN (HGB A1C): HbA1c, POC (controlled diabetic range): 6.1 % (ref 0.0–7.0)

## 2020-10-12 MED ORDER — MELOXICAM 15 MG PO TABS: ORAL_TABLET | ORAL | 2 refills | Status: DC

## 2020-10-12 NOTE — Patient Instructions (Addendum)
Continue current medications A1c is 6.1% today, improved from 6.5% at last visit. See me again in 3-4 months

## 2020-10-16 NOTE — Assessment & Plan Note (Signed)
BP is mildly elevated today.  Continue with weight loss efforts and low sodium diet.  Continue current medication

## 2020-10-16 NOTE — Progress Notes (Signed)
Christopher [name] - 50 y.o. male MRN [id number]  Date of birth: 1971-01-07  Subjective Chief Complaint  Patient presents with   Hypertension   Diabetes    HPI Christopher [name] is a 50 y.o. male here today for follow up of HTN and T2DM.  He reports that he is doing well.  BP has remained well controlled with valsartan/hctz.  He denies side effects from medication.  He is trying to watch sodium intake.  He denies chest pain, shortness of breath,palpitations, headache or vision changes.   He is doing well with trulicity and metformin for management of blood sugar.  Weight is down about 11 lbs since last visit.  He is trying to walk more.  He is tolerating atorvastatin well for associated HLD.    He would like refill of meloxicam for back and knee pain.   ROS:  A comprehensive ROS was completed and negative except as noted per HPI  No Known Allergies  Past Medical History:  Diagnosis Date   Depression    Diabetes mellitus without complication ([place])    Hyperlipidemia    Hypertension    Hypogonadal obesity    Lumbar degenerative disc disease    Obesity    OSA (obstructive sleep apnea) 12/02/2013   CPAP therapy on 17 cm H2O with a Wide size Resmed Nasal Mask Mirage FX mask and heated humidification    Post traumatic stress disorder (PTSD)    Prediabetes 2018   Sleep apnea    cpap    Past Surgical History:  Procedure Laterality Date   VASECTOMY  08/2017    Social History   Socioeconomic History   Marital status: Single    Spouse name: Not on file   Number of children: Not on file   Years of education: Not on file   Highest education level: Not on file  Occupational History   Not on file  Tobacco Use   Smoking status: Never   Smokeless tobacco: Never  Vaping Use   Vaping Use: Never used  Substance and Sexual Activity   Alcohol use: Yes    Comment: less than weekly   Drug use: No   Sexual activity: Yes    Partners: Female  Other Topics Concern   Not on file  Social  History Narrative   ** Merged History Encounter **       Social Determinants of Health   Financial Resource Strain: Not on file  Food Insecurity: Not on file  Transportation Needs: Not on file  Physical Activity: Not on file  Stress: Not on file  Social Connections: Not on file    Family History  Problem Relation Age of Onset   Prostate cancer Other    Colon cancer Neg Hx    Colon polyps Neg Hx    Esophageal cancer Neg Hx    Rectal cancer Neg Hx    Stomach cancer Neg Hx     Health Maintenance  Topic Date Due   Pneumococcal Vaccine 60-61 Years old (1 - PCV) Never done   HIV Screening  Never done   Hepatitis C Screening  Never done   OPHTHALMOLOGY EXAM  01/10/2019   COVID-19 Vaccine (3 - Pfizer risk series) 08/26/2019   INFLUENZA VACCINE  12/04/2020   Zoster Vaccines- Shingrix (2 of 2) 12/07/2020   HEMOGLOBIN A1C  04/13/2021   FOOT EXAM  10/12/2021   TETANUS/TDAP  08/10/2024   COLONOSCOPY (Pts 45-73yrs Insurance coverage will need to be confirmed)  12/16/2024  PNEUMOCOCCAL POLYSACCHARIDE VACCINE AGE 36-64 HIGH RISK  Completed   HPV VACCINES  Aged Out     ----------------------------------------------------------------------------------------------------------------------------------------------------------------------------------------------------------------- Physical Exam BP (!) 145/83 (BP Location: Left Arm, Patient Position: Sitting, Cuff Size: Large)   Pulse 91   Ht 5\' 8"  (1.727 m)   Wt (!) 337 lb (152.9 kg)   SpO2 94%   BMI 51.24 kg/m   Physical Exam Constitutional:      Appearance: Normal appearance.  HENT:     Head: Normocephalic and atraumatic.  Eyes:     General: No scleral icterus. Cardiovascular:     Rate and Rhythm: Normal rate and regular rhythm.  Pulmonary:     Effort: Pulmonary effort is normal.     Breath sounds: Normal breath sounds.  Neurological:     General: No focal deficit present.     Mental Status: He is alert.  Psychiatric:         Mood and Affect: Mood normal.    ------------------------------------------------------------------------------------------------------------------------------------------------------------------------------------------------------------------- Assessment and Plan  Hypertension associated with diabetes ([place]) BP is mildly elevated today.  Continue with weight loss efforts and low sodium diet.  Continue current medication  Hyperlipidemia associated with type 2 diabetes mellitus ([place]) Lab Results  Component Value Date   LDLCALC 100 (H) 02/03/2020  Continue atorvastatin at current strength  Controlled type 2 diabetes mellitus without complication, without long-term current use of insulin ([place]) Most recent A1c of  Lab Results  Component Value Date   HGBA1C 6.1 10/12/2020   indicates diabetes is well controlled.  he  will continue current medications. .  Counseled on healthy, low carb diet and recommend frequent activity to help with maintaining good control of blood sugars.    Lumbar degenerative disc disease Meloxicam renewed.    Meds ordered this encounter  Medications   meloxicam (MOBIC) 15 MG tablet    Sig: One tab PO qAM with breakfast for 2 weeks, then daily prn pain.    Dispense:  90 tablet    Refill:  2    Return in about 3 months (around 01/12/2021) for HTN/T2DM/Labs.    This visit occurred during the SARS-CoV-2 public health emergency.  Safety protocols were in place, including screening questions prior to the visit, additional usage of staff PPE, and extensive cleaning of exam room while observing appropriate contact time as indicated for disinfecting solutions.

## 2020-10-16 NOTE — Assessment & Plan Note (Signed)
Meloxicam renewed.

## 2020-10-16 NOTE — Assessment & Plan Note (Signed)
Most recent A1c of  Lab Results  Component Value Date   HGBA1C 6.1 10/12/2020   indicates diabetes is well controlled.  he  will continue current medications. .  Counseled on healthy, low carb diet and recommend frequent activity to help with maintaining good control of blood sugars.

## 2020-10-16 NOTE — Assessment & Plan Note (Signed)
Lab Results  Component Value Date   LDLCALC 100 (H) 02/03/2020  Continue atorvastatin at current strength

## 2020-11-10 ENCOUNTER — Ambulatory Visit (INDEPENDENT_AMBULATORY_CARE_PROVIDER_SITE_OTHER): Payer: Managed Care, Other (non HMO)

## 2020-11-10 ENCOUNTER — Encounter: Payer: Self-pay | Admitting: Sports Medicine

## 2020-11-10 ENCOUNTER — Other Ambulatory Visit: Payer: Self-pay

## 2020-11-10 ENCOUNTER — Ambulatory Visit (INDEPENDENT_AMBULATORY_CARE_PROVIDER_SITE_OTHER): Payer: Managed Care, Other (non HMO) | Admitting: Sports Medicine

## 2020-11-10 DIAGNOSIS — M160 Bilateral primary osteoarthritis of hip: Secondary | ICD-10-CM

## 2020-11-10 MED ORDER — TRAMADOL HCL 50 MG PO TABS: 50.0000 mg | ORAL_TABLET | Freq: Three times a day (TID) | ORAL | 0 refills | Status: DC | PRN

## 2020-11-10 NOTE — Progress Notes (Signed)
    Procedures performed today:    Procedure: Real-time Ultrasound Guided injection of the left hip joint Device: Samsung HS60  Verbal informed consent obtained.  Time-out conducted.  Noted no overlying erythema, induration, or other signs of local infection.  Skin prepped in a sterile fashion.  Local anesthesia: Topical Ethyl chloride.  With sterile technique and under real time ultrasound guidance: Noted arthritic hip, 1 cc Kenalog 40, 2 cc lidocaine, 2 cc bupivacaine injected easily Completed without difficulty  Advised to call if fevers/chills, erythema, induration, drainage, or persistent bleeding.  Images permanently stored and available for review in PACS.  Impression: Technically successful ultrasound guided injection.  Independent interpretation of notes and tests performed by another provider:   None.  Brief History, Exam, Impression, and Recommendations:    Primary osteoarthritis of both hips This is a very pleasant 50 year old male, he had a hip joint injection back in January of this year that provided several months of relief, now with recurrence of pain. At the last visit he was in the process for bariatric surgery. He does have a trip coming up at the end of July, and would like some relief for when he goes on that trip. Today we injected his left hip joint again, I would like some updated x-rays today, adding tramadol for breakthrough pain. Return to see me in a month. I did explain to him that the ultimate end to this was a hip arthroplasty.    ___________________________________________ Gwen Her. Dianah Field, M.D., ABFM., CAQSM. Primary Care and [place] Instructor of [place] [employer] Medicine

## 2020-11-10 NOTE — Assessment & Plan Note (Signed)
This is a very pleasant 50 year old male, he had a hip joint injection back in January of this year that provided several months of relief, now with recurrence of pain. At the last visit he was in the process for bariatric surgery. He does have a trip coming up at the end of July, and would like some relief for when he goes on that trip. Today we injected his left hip joint again, I would like some updated x-rays today, adding tramadol for breakthrough pain. Return to see me in a month. I did explain to him that the ultimate end to this was a hip arthroplasty.

## 2020-11-23 ENCOUNTER — Telehealth: Payer: Self-pay

## 2020-11-23 NOTE — Telephone Encounter (Signed)
Patient called concerning leaving documents for Doctor Zigmund Daniel signature.   Please call and advise patient to drop docs off at front desk. Advise of usual signature timeframe.   Thanks

## 2020-11-24 ENCOUNTER — Telehealth: Payer: Self-pay | Admitting: Family Medicine

## 2020-11-24 NOTE — Telephone Encounter (Signed)
Pt left form to fill out for bariatric surgery. Letter was left in Dr. Zigmund Daniel box, and patient was made away of possible 29$ fee and 3-5 business day wait.

## 2020-11-24 NOTE — Telephone Encounter (Signed)
Spoke with patient and let patient know about the fee and turn around time and that he can drop forms off up front. AM

## 2020-12-01 ENCOUNTER — Telehealth: Payer: Self-pay

## 2020-12-01 NOTE — Telephone Encounter (Signed)
Form completed and placed in Assistant's box.

## 2020-12-01 NOTE — Telephone Encounter (Signed)
Scanned into patients chart & called patient, couldn't leave voicemail for patient so called his Significant other and left voicemail regarding paperwork is ready to be picked up. Will attempt patient again later. AM

## 2020-12-01 NOTE — Telephone Encounter (Signed)
Please call and advised patient of completed documents.   Documents to be faxed after payment received at [facility].   Thanks

## 2020-12-11 ENCOUNTER — Encounter: Payer: Self-pay | Admitting: Family Medicine

## 2020-12-11 LAB — HM DIABETES EYE EXAM

## 2020-12-26 IMAGING — DX LUMBAR SPINE - COMPLETE 4+ VIEW
5 series · 5 of 5 positions shown · non-contrast
Comparison: 01/29/2016

CLINICAL DATA: Lt lat hip pain x2mos.

EXAM:
LUMBAR SPINE - COMPLETE 4+ VIEW

[l-spine ap]
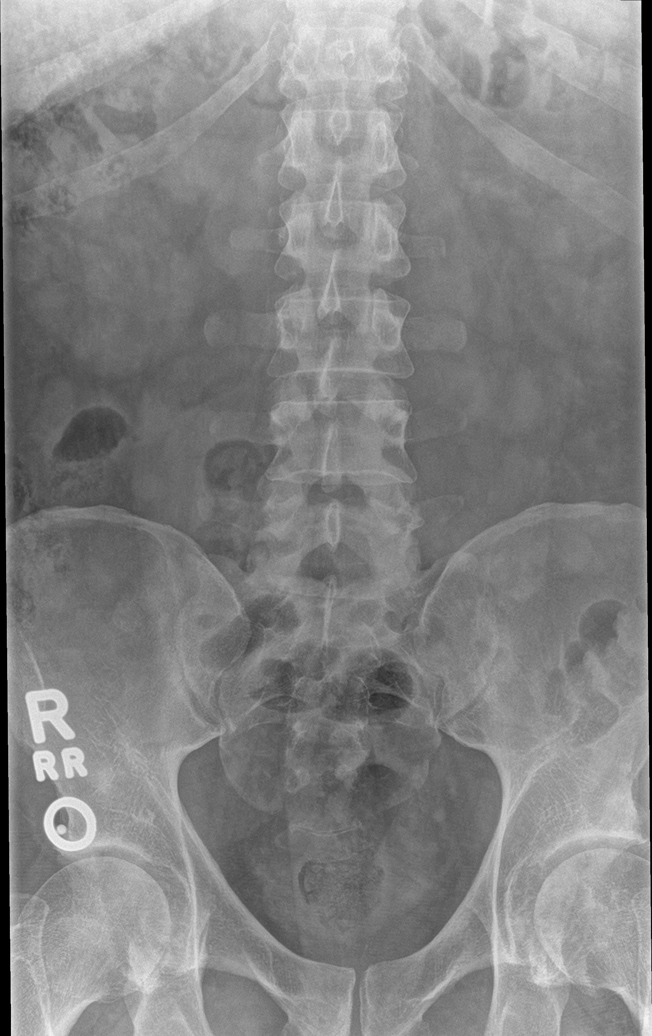

[l-spine obl (1 of 2)]
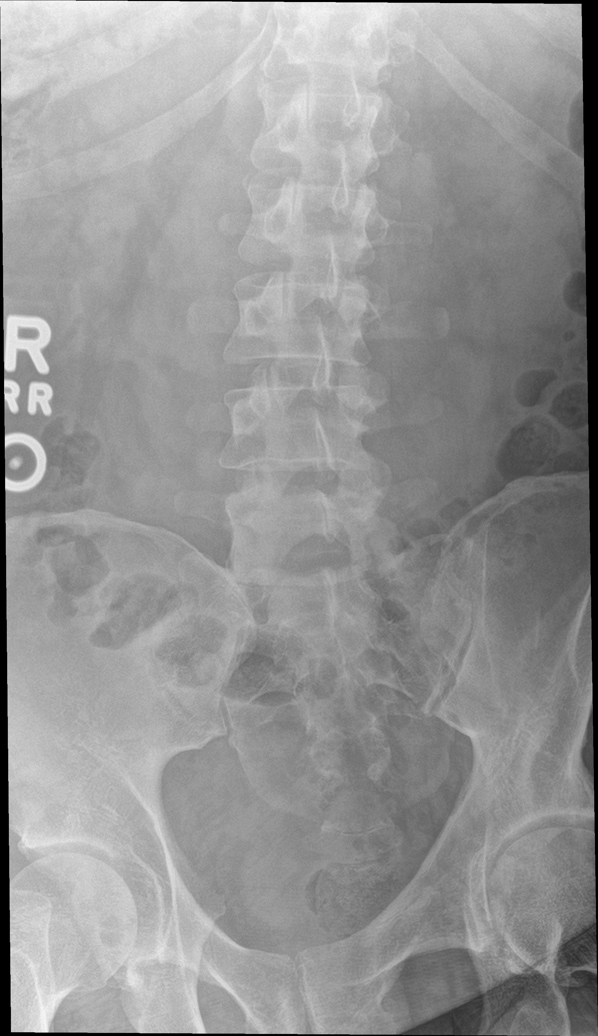

[l-spine obl (2 of 2)]
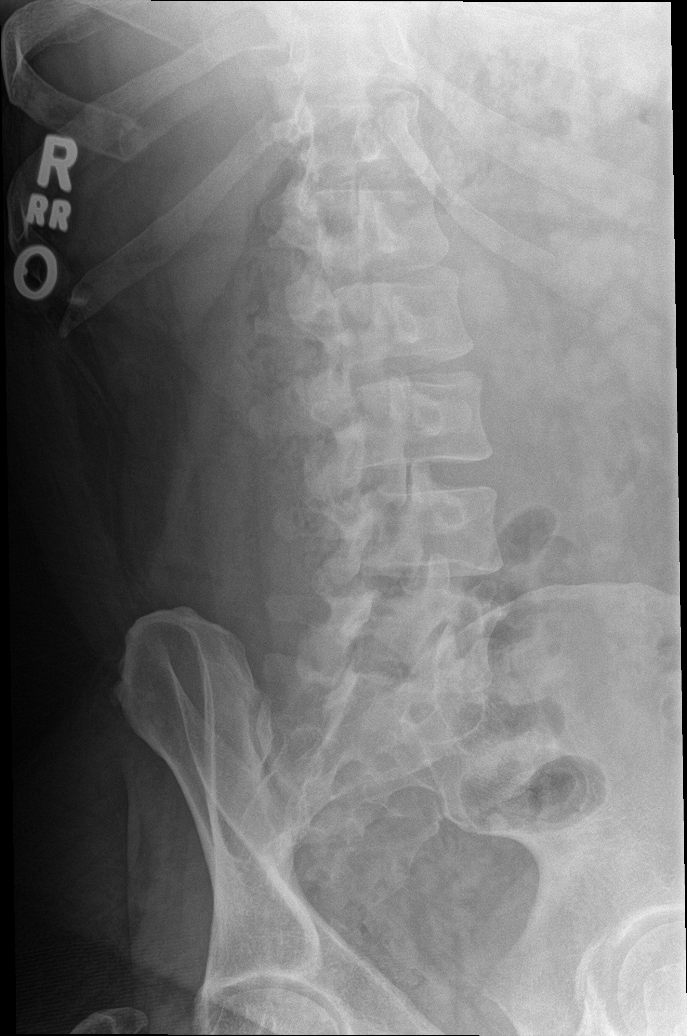

[l-spine lat]
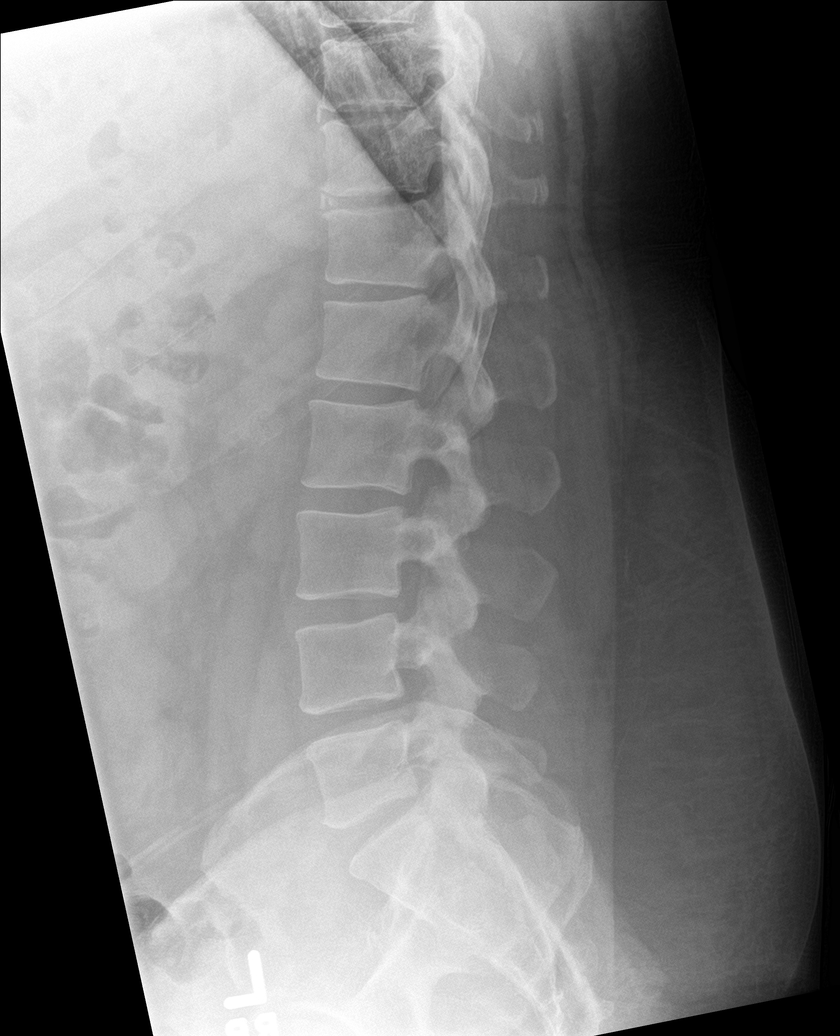

[l-spine spot]
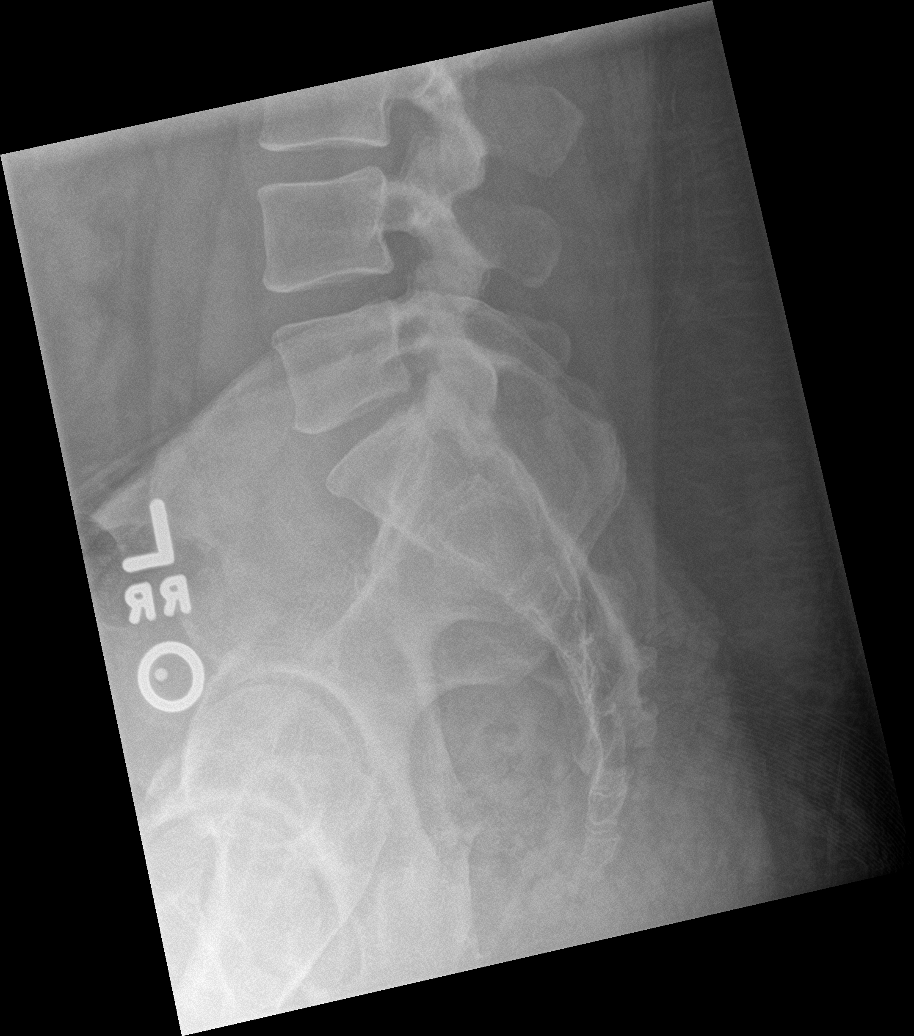

[5 of 5 positions shown; findings below may reference images not displayed]

FINDINGS: There is no evidence of lumbar spine fracture. Alignment is normal.
Intervertebral disc spaces are maintained.
IMPRESSION: Negative.

## 2020-12-29 ENCOUNTER — Encounter: Payer: Self-pay | Admitting: Family Medicine

## 2021-01-18 ENCOUNTER — Ambulatory Visit: Payer: Managed Care, Other (non HMO) | Admitting: Family Medicine

## 2021-02-09 ENCOUNTER — Ambulatory Visit: Payer: Managed Care, Other (non HMO) | Admitting: Family Medicine

## 2021-02-09 ENCOUNTER — Encounter: Payer: Self-pay | Admitting: Family Medicine

## 2021-02-09 VITALS — BP 128/70 | HR 67 | Temp 98.0°F | Ht 68.0 in | Wt 301.0 lb

## 2021-02-09 DIAGNOSIS — Z23 Encounter for immunization: Secondary | ICD-10-CM

## 2021-02-09 DIAGNOSIS — Z903 Acquired absence of stomach [part of]: Secondary | ICD-10-CM | POA: Diagnosis not present

## 2021-02-09 DIAGNOSIS — I152 Hypertension secondary to endocrine disorders: Secondary | ICD-10-CM

## 2021-02-09 DIAGNOSIS — E1159 Type 2 diabetes mellitus with other circulatory complications: Secondary | ICD-10-CM

## 2021-02-09 DIAGNOSIS — E119 Type 2 diabetes mellitus without complications: Secondary | ICD-10-CM

## 2021-02-09 LAB — POCT GLYCOSYLATED HEMOGLOBIN (HGB A1C): Hemoglobin A1C: 5.5 % (ref 4.0–5.6)

## 2021-02-09 NOTE — Patient Instructions (Signed)
Great to see you today! Let's follow up in about 4 months.

## 2021-02-11 DIAGNOSIS — Z903 Acquired absence of stomach [part of]: Secondary | ICD-10-CM | POA: Insufficient documentation

## 2021-02-11 NOTE — Assessment & Plan Note (Signed)
Blood pressure is well controlled off of medication.  He will continue to monitor at home.  Encouraged to continue current lifestyle and dietary changes.

## 2021-02-11 NOTE — Progress Notes (Signed)
Christopher [name] - 50 y.o. male MRN [id number]  Date of birth: 1970/08/06  Subjective Chief Complaint  Patient presents with   Diabetes    HPI Christopher [name] is a 50 year old male here today for follow-up visit.  He is proximately 3 weeks status post laparoscopic sleeve gastrectomy.  He has done well postoperatively.  He recently advance his diet to more soft solids.  His valsartan, Trulicity and metformin were discontinued after surgery.  Blood pressures remain well controlled.  He reports her blood sugars are around 90-110.Marland Kitchen  His chronic joint pain is also already a little better.  He is interested in having updated flu vaccine and shingles vaccine today.  ROS:  A comprehensive ROS was completed and negative except as noted per HPI  No Known Allergies  Past Medical History:  Diagnosis Date   Depression    Diabetes mellitus without complication ([place])    Hyperlipidemia    Hypertension    Hypogonadal obesity    Lumbar degenerative disc disease    Obesity    OSA (obstructive sleep apnea) 12/02/2013   CPAP therapy on 17 cm H2O with a Wide size Resmed Nasal Mask Mirage FX mask and heated humidification    Post traumatic stress disorder (PTSD)    Prediabetes 2018   Sleep apnea    cpap    Past Surgical History:  Procedure Laterality Date   VASECTOMY  08/2017    Social History   Socioeconomic History   Marital status: Single    Spouse name: Not on file   Number of children: Not on file   Years of education: Not on file   Highest education level: Not on file  Occupational History   Not on file  Tobacco Use   Smoking status: Never   Smokeless tobacco: Never  Vaping Use   Vaping Use: Never used  Substance and Sexual Activity   Alcohol use: Yes    Comment: less than weekly   Drug use: No   Sexual activity: Yes    Partners: Female  Other Topics Concern   Not on file  Social History Narrative   ** Merged History Encounter **       Social Determinants of Health   Financial  Resource Strain: Not on file  Food Insecurity: Not on file  Transportation Needs: Not on file  Physical Activity: Not on file  Stress: Not on file  Social Connections: Not on file    Family History  Problem Relation Age of Onset   Prostate cancer Other    Colon cancer Neg Hx    Colon polyps Neg Hx    Esophageal cancer Neg Hx    Rectal cancer Neg Hx    Stomach cancer Neg Hx     Health Maintenance  Topic Date Due   HIV Screening  Never done   Hepatitis C Screening  Never done   COVID-19 Vaccine (3 - Booster for [place] series) 12/29/2019   HEMOGLOBIN A1C  08/10/2021   FOOT EXAM  10/12/2021   OPHTHALMOLOGY EXAM  12/11/2021   TETANUS/TDAP  08/10/2024   COLONOSCOPY (Pts 45-26yrs Insurance coverage will need to be confirmed)  12/16/2024   INFLUENZA VACCINE  Completed   Zoster Vaccines- Shingrix  Completed   HPV VACCINES  Aged Out     ----------------------------------------------------------------------------------------------------------------------------------------------------------------------------------------------------------------- Physical Exam BP 128/70 (BP Location: Left Arm, Patient Position: Sitting, Cuff Size: Large)   Pulse 67   Temp 98 F (36.7 C)   Ht 5\' 8"  (1.727 m)  Wt (!) 301 lb (136.5 kg)   SpO2 99%   BMI 45.77 kg/m   Physical Exam Constitutional:      Appearance: Normal appearance.  Eyes:     General: No scleral icterus. Cardiovascular:     Rate and Rhythm: Normal rate and regular rhythm.  Pulmonary:     Effort: Pulmonary effort is normal.     Breath sounds: Normal breath sounds.  Musculoskeletal:     Cervical back: Neck supple.  Neurological:     General: No focal deficit present.     Mental Status: He is alert.  Psychiatric:        Mood and Affect: Mood normal.        Behavior: Behavior normal.     ------------------------------------------------------------------------------------------------------------------------------------------------------------------------------------------------------------------- Assessment and Plan  Hypertension associated with diabetes ([place]) Blood pressure is well controlled off of medication.  He will continue to monitor at home.  Encouraged to continue current lifestyle and dietary changes.  Controlled type 2 diabetes mellitus without complication, without long-term current use of insulin (H[facility]) His A1c is essentially normalized at this point.  He is not currently on any medications for management of his diabetes.  We will plan to follow-up in about 3 months.  Status post sleeve gastrectomy He is recovering well from recent surgery.  He will continue to stick with new dietary and lifestyle changes.   No orders of the defined types were placed in this encounter.   Return in about 4 months (around 06/12/2021) for HTN.    This visit occurred during the SARS-CoV-2 public health emergency.  Safety protocols were in place, including screening questions prior to the visit, additional usage of staff PPE, and extensive cleaning of exam room while observing appropriate contact time as indicated for disinfecting solutions.

## 2021-02-11 NOTE — Assessment & Plan Note (Signed)
His A1c is essentially normalized at this point.  He is not currently on any medications for management of his diabetes.  We will plan to follow-up in about 3 months.

## 2021-02-11 NOTE — Assessment & Plan Note (Signed)
He is recovering well from recent surgery.  He will continue to stick with new dietary and lifestyle changes.

## 2021-03-05 DIAGNOSIS — Z9884 Bariatric surgery status: Secondary | ICD-10-CM | POA: Insufficient documentation

## 2021-06-14 ENCOUNTER — Encounter: Payer: Self-pay | Admitting: Family Medicine

## 2021-06-14 ENCOUNTER — Other Ambulatory Visit: Payer: Self-pay

## 2021-06-14 ENCOUNTER — Ambulatory Visit: Payer: Managed Care, Other (non HMO) | Admitting: Family Medicine

## 2021-06-14 VITALS — BP 133/72 | HR 62 | Temp 97.6°F | Ht 68.0 in | Wt 239.0 lb

## 2021-06-14 DIAGNOSIS — E1159 Type 2 diabetes mellitus with other circulatory complications: Secondary | ICD-10-CM | POA: Diagnosis not present

## 2021-06-14 DIAGNOSIS — Z903 Acquired absence of stomach [part of]: Secondary | ICD-10-CM

## 2021-06-14 DIAGNOSIS — E1169 Type 2 diabetes mellitus with other specified complication: Secondary | ICD-10-CM | POA: Diagnosis not present

## 2021-06-14 DIAGNOSIS — E785 Hyperlipidemia, unspecified: Secondary | ICD-10-CM

## 2021-06-14 DIAGNOSIS — E119 Type 2 diabetes mellitus without complications: Secondary | ICD-10-CM | POA: Diagnosis not present

## 2021-06-14 DIAGNOSIS — F4312 Post-traumatic stress disorder, chronic: Secondary | ICD-10-CM

## 2021-06-14 DIAGNOSIS — I152 Hypertension secondary to endocrine disorders: Secondary | ICD-10-CM

## 2021-06-14 MED ORDER — PRAZOSIN HCL 1 MG PO CAPS: 1.0000 mg | ORAL_CAPSULE | Freq: Every day | ORAL | 3 refills | Status: DC

## 2021-06-14 NOTE — Progress Notes (Signed)
Christopher [name] - 51 y.o. male MRN [id number]  Date of birth: 1970-06-02  Subjective Chief Complaint  Patient presents with   Follow-up    HPI Yulian is a 51 year old male here today for follow-up visit.  He is status post sleeve gastric bypass in September 2022.  He has done quite well since having this completed.  Weight is down significantly.  He is exercising more often and feels more motivated to do so.    He remains on atorvastatin for management of hyperlipidemia.  He is off of all diabetes medications.  Prazosin continues to work well for management of PTSD symptoms.  He does need a refill of this.  ROS:  A comprehensive ROS was completed and negative except as noted per HPI  No Known Allergies  Past Medical History:  Diagnosis Date   Depression    Diabetes mellitus without complication ([place])    Hyperlipidemia    Hypertension    Hypogonadal obesity    Lumbar degenerative disc disease    Obesity    OSA (obstructive sleep apnea) 12/02/2013   CPAP therapy on 17 cm H2O with a Wide size Resmed Nasal Mask Mirage FX mask and heated humidification    Post traumatic stress disorder (PTSD)    Prediabetes 2018   Sleep apnea    cpap    Past Surgical History:  Procedure Laterality Date   VASECTOMY  08/2017    Social History   Socioeconomic History   Marital status: Single    Spouse name: Not on file   Number of children: Not on file   Years of education: Not on file   Highest education level: Not on file  Occupational History   Not on file  Tobacco Use   Smoking status: Never   Smokeless tobacco: Never  Vaping Use   Vaping Use: Never used  Substance and Sexual Activity   Alcohol use: Yes    Comment: less than weekly   Drug use: No   Sexual activity: Yes    Partners: Female  Other Topics Concern   Not on file  Social History Narrative   ** Merged History Encounter **       Social Determinants of Health   Financial Resource Strain: Not on file  Food  Insecurity: Not on file  Transportation Needs: Not on file  Physical Activity: Not on file  Stress: Not on file  Social Connections: Not on file    Family History  Problem Relation Age of Onset   Prostate cancer Other    Colon cancer Neg Hx    Colon polyps Neg Hx    Esophageal cancer Neg Hx    Rectal cancer Neg Hx    Stomach cancer Neg Hx     Health Maintenance  Topic Date Due   HIV Screening  Never done   Hepatitis C Screening  Never done   COVID-19 Vaccine (3 - Booster for [place] series) 09/23/2019   HEMOGLOBIN A1C  08/10/2021   FOOT EXAM  10/12/2021   OPHTHALMOLOGY EXAM  12/11/2021   TETANUS/TDAP  08/10/2024   COLONOSCOPY (Pts 45-60yrs Insurance coverage will need to be confirmed)  12/16/2024   INFLUENZA VACCINE  Completed   Zoster Vaccines- Shingrix  Completed   HPV VACCINES  Aged Out     ----------------------------------------------------------------------------------------------------------------------------------------------------------------------------------------------------------------- Physical Exam BP 133/72 (BP Location: Left Arm, Patient Position: Sitting, Cuff Size: Large)    Pulse 62    Temp 97.6 F (36.4 C)    Ht 5\' 8"  (  1.727 m)    Wt 239 lb (108.4 kg)    SpO2 100%    BMI 36.34 kg/m   Physical Exam Constitutional:      Appearance: Normal appearance.  Eyes:     General: No scleral icterus. Cardiovascular:     Rate and Rhythm: Normal rate and regular rhythm.  Pulmonary:     Effort: Pulmonary effort is normal.     Breath sounds: Normal breath sounds.  Musculoskeletal:     Cervical back: Neck supple.  Neurological:     General: No focal deficit present.     Mental Status: He is alert.  Psychiatric:        Mood and Affect: Mood normal.        Behavior: Behavior normal.     ------------------------------------------------------------------------------------------------------------------------------------------------------------------------------------------------------------------- Assessment and Plan  Chronic post-traumatic stress disorder (PTSD) Continues to do well with combination of prazosin and doxepin.  We will plan to continue at current strength.  Status post sleeve gastrectomy Doing well since bariatric surgery.  Weight is down he is feeling much better.  Encouraged to stick with dietary change and new exercise habits.  Controlled type 2 diabetes mellitus without complication, without long-term current use of insulin ([facility]) Updating A1c today.  He is off of all medications for management of diabetes.  Hyperlipidemia associated with type 2 diabetes mellitus ([place]) Continues to tolerate atorvastatin.  Updating lipid panel.   Meds ordered this encounter  Medications   prazosin (MINIPRESS) 1 MG capsule    Sig: Take 1 capsule (1 mg total) by mouth at bedtime.    Dispense:  90 capsule    Refill:  3    No follow-ups on file.    This visit occurred during the SARS-CoV-2 public health emergency.  Safety protocols were in place, including screening questions prior to the visit, additional usage of staff PPE, and extensive cleaning of exam room while observing appropriate contact time as indicated for disinfecting solutions.

## 2021-06-14 NOTE — Assessment & Plan Note (Signed)
Continues to tolerate atorvastatin.  Updating lipid panel.

## 2021-06-14 NOTE — Assessment & Plan Note (Signed)
Continues to do well with combination of prazosin and doxepin.  We will plan to continue at current strength.

## 2021-06-14 NOTE — Assessment & Plan Note (Signed)
Doing well since bariatric surgery.  Weight is down he is feeling much better.  Encouraged to stick with dietary change and new exercise habits.

## 2021-06-14 NOTE — Assessment & Plan Note (Signed)
Updating A1c today.  He is off of all medications for management of diabetes.

## 2021-06-15 LAB — COMPLETE METABOLIC PANEL WITH GFR
AG Ratio: 1.4 (calc) (ref 1.0–2.5)
ALT: 10 U/L (ref 9–46)
AST: 11 U/L (ref 10–35)
Albumin: 4.2 g/dL (ref 3.6–5.1)
Alkaline phosphatase (APISO): 101 U/L (ref 35–144)
BUN: 9 mg/dL (ref 7–25)
CO2: 31 mmol/L (ref 20–32)
Calcium: 10 mg/dL (ref 8.6–10.3)
Chloride: 104 mmol/L (ref 98–110)
Creat: 0.74 mg/dL (ref 0.70–1.30)
Globulin: 3.1 g/dL (calc) (ref 1.9–3.7)
Glucose, Bld: 81 mg/dL (ref 65–99)
Potassium: 4.1 mmol/L (ref 3.5–5.3)
Sodium: 141 mmol/L (ref 135–146)
Total Bilirubin: 0.6 mg/dL (ref 0.2–1.2)
Total Protein: 7.3 g/dL (ref 6.1–8.1)
eGFR: 110 mL/min/{1.73_m2} (ref >=60)

## 2021-06-15 LAB — CBC WITH DIFFERENTIAL/PLATELET
Absolute Monocytes: 239 cells/uL (ref 200–950)
Basophils Absolute: 41 cells/uL (ref 0–200)
Basophils Relative: 0.9 %
Eosinophils Absolute: 9 cells/uL — ABNORMAL LOW (ref 15–500)
Eosinophils Relative: 0.2 %
HCT: 44.8 % (ref 38.5–50.0)
Hemoglobin: 14.4 g/dL (ref 13.2–17.1)
Lymphs Abs: 1601 cells/uL (ref 850–3900)
MCH: 26.5 pg — ABNORMAL LOW (ref 27.0–33.0)
MCHC: 32.1 g/dL (ref 32.0–36.0)
MCV: 82.5 fL (ref 80.0–100.0)
MPV: 10.3 fL (ref 7.5–12.5)
Monocytes Relative: 5.2 %
Neutro Abs: 2709 cells/uL (ref 1500–7800)
Neutrophils Relative %: 58.9 %
Platelets: 319 10*3/uL (ref 140–400)
RBC: 5.43 10*6/uL (ref 4.20–5.80)
RDW: 16.2 % — ABNORMAL HIGH (ref 11.0–15.0)
Total Lymphocyte: 34.8 %
WBC: 4.6 10*3/uL (ref 3.8–10.8)

## 2021-06-15 LAB — HEMOGLOBIN A1C
Hgb A1c MFr Bld: 5.4 % of total Hgb (ref <5.7)
Mean Plasma Glucose: 108 mg/dL
eAG (mmol/L): 6 mmol/L

## 2021-06-15 LAB — LIPID PANEL W/REFLEX DIRECT LDL
Cholesterol: 138 mg/dL (ref <200)
HDL: 45 mg/dL (ref >=40)
LDL Cholesterol (Calc): 79 mg/dL (calc)
Non-HDL Cholesterol (Calc): 93 mg/dL (calc) (ref <130)
Total CHOL/HDL Ratio: 3.1 (calc) (ref <5.0)
Triglycerides: 63 mg/dL (ref <150)

## 2021-07-03 ENCOUNTER — Telehealth: Payer: Self-pay

## 2021-07-03 NOTE — Telephone Encounter (Signed)
Initiated Prior authorization [id number] (0.25 or 0.5 MG/DOSE) 2MG /1.5ML pen-injectors Via: Covermymeds Case/Key:n/a Status: cancelled as of 07/03/21 Reason: no pa is need for this medication pt hasn't been on this therapy since 2021 Notified Pt via: Mychart

## 2021-07-16 ENCOUNTER — Other Ambulatory Visit: Payer: Self-pay

## 2021-07-16 ENCOUNTER — Emergency Department
Admission: EM | Admit: 2021-07-16 | Discharge: 2021-07-16 | Disposition: A | Discharge status: Home / Self Care | Payer: Managed Care, Other (non HMO) | Source: Home / Self Care

## 2021-07-16 DIAGNOSIS — T7840XA Allergy, unspecified, initial encounter: Secondary | ICD-10-CM

## 2021-07-16 DIAGNOSIS — W57XXXA Bitten or stung by nonvenomous insect and other nonvenomous arthropods, initial encounter: Secondary | ICD-10-CM

## 2021-07-16 MED ORDER — PREDNISONE 20 MG PO TABS: 40.0000 mg | ORAL_TABLET | Freq: Every day | ORAL | 0 refills | Status: DC

## 2021-07-16 NOTE — ED Triage Notes (Signed)
Pt c/o spider bite since Saturday evening. Works at [employer], thinks he brushed up against some webs. Bite to RT hand, RT cheek and LT index finger. Some redness and swelling noted. Very itchy.  ?

## 2021-07-16 NOTE — ED Provider Notes (Signed)
?[facility] ? ? ? ?CSN: [id number] ?Arrival date & time: 07/16/21  1904 ? ? ?  ? ?History   ?Chief Complaint ?Chief Complaint  ?Patient presents with  ? Insect Bite  ?  Spider  ? ? ?HPI ?Christopher [name] is a 51 y.o. male.  ? ?HPI ? ?Noticed some insect bites over the weekend.  Thinks they may have come from work.  Is allergic to bee stings ?He is no longer on diabetes medications after loosing over a hundred pounds ?The bites are getting larger and itch terribly ?No oral swelling or trouble breathing ? ? ?Past Medical History:  ?Diagnosis Date  ? Depression   ? Diabetes mellitus without complication ([place])   ? Hyperlipidemia   ? Hypertension   ? Hypogonadal obesity   ? Lumbar degenerative disc disease   ? Obesity   ? OSA (obstructive sleep apnea) 12/02/2013  ? CPAP therapy on 17 cm H2O with a Wide size Resmed Nasal Mask Mirage FX mask and heated humidification   ? Post traumatic stress disorder (PTSD)   ? Prediabetes 2018  ? Sleep apnea   ? cpap  ? ? ?Patient Active Problem List  ? Diagnosis Date Noted  ? 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/05/2021  ? 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/11/2021  ? GERD (gastroesophageal reflux disease) 08/02/2020  ? Right elbow pain 06/12/2020  ? Dysphagia 02/17/2020  ? Primary osteoarthritis of both hips 11/12/2018  ? Class 3 severe obesity due to excess calories with serious comorbidity in adult [facility]) 06/15/2017  ? Controlled type 2 diabetes mellitus without complication, without long-term current use of insulin ([place]) 06/15/2017  ? 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  ? Hypertension associated with diabetes ([place]) 01/10/2017  ? Elevated hematocrit 11/24/2016  ? Hypogonadism in male 06/01/2016  ? Abnormal prostate specific antigen (PSA) 06/01/2016  ? Chronic post-traumatic stress disorder (PTSD) 05/30/2016  ? Nightmares associated with chronic post-traumatic stress disorder 05/30/2016  ? Lumbar degenerative disc disease 02/05/2016  ? OSA on CPAP 12/02/2013  ? Severe obesity  (BMI >= 40) ([place]) 12/02/2013  ? Family history of prostate cancer 12/02/2013  ? Hyperlipidemia associated with type 2 diabetes mellitus ([place])   ? ? ?Past Surgical History:  ?Procedure Laterality Date  ? VASECTOMY  08/2017  ? ? ? ? ? ?Home Medications   ? ?Prior to Admission medications   ?Medication Sig Start Date End Date Taking? Authorizing Provider  ?predniSONE (DELTASONE) 20 MG tablet Take 2 tablets (40 mg total) by mouth daily in the afternoon. 07/16/21  Yes Raylene Everts, MD  ?AMBULATORY NON FORMULARY MEDICATION CPAP therapy on 17 cm H2O with a Wide size Resmed Nasal Mask Mirage FX mask and heated humidification. Use nightly for sleep.  Dx: Obstructive Sleep Apnea 06/30/18   Trixie Dredge, PA-C  ?aprepitant (EMEND) 40 MG capsule Take 40 mg by mouth 2 (two) times daily. 01/05/21   [provider]  ?atorvastatin (LIPITOR) 20 MG tablet Take 1 tablet (20 mg total) by mouth daily. 05/11/20   Luetta Nutting, DO  ?doxepin (SINEQUAN) 75 MG capsule Take 1 capsule (75 mg total) by mouth at bedtime. 02/11/19   Gregor Hams, MD  ?EPINEPHrine 0.3 mg/0.3 mL IJ SOAJ injection Inject 0.3 mLs (0.3 mg total) into the muscle as needed for anaphylaxis. 11/12/18   Trixie Dredge, PA-C  ?pantoprazole (PROTONIX) 40 MG tablet Take 1 tablet (40 mg total) by mouth daily. 02/17/20   Luetta Nutting, DO  ?prazosin (MINIPRESS) 1 MG capsule  Take 1 capsule (1 mg total) by mouth at bedtime. 06/14/21   Luetta Nutting, DO  ?sildenafil (VIAGRA) 100 MG tablet Take 0.5-1 tablets (50-100 mg total) by mouth daily as needed for erectile dysfunction. 02/11/19   Gregor Hams, MD  ?ursodiol (ACTIGALL) 300 MG capsule Take 300 mg by mouth 2 (two) times daily. 01/04/21   [provider]  ?zinc gluconate 50 MG tablet SMARTSIG:1 caplet By Mouth Daily 07/14/20   [provider]  ? ? ?Family History ?Family History  ?Problem Relation Age of Onset  ? Prostate cancer Other   ? Colon cancer Neg Hx   ? Colon polyps Neg  Hx   ? Esophageal cancer Neg Hx   ? Rectal cancer Neg Hx   ? Stomach cancer Neg Hx   ? ? ?Social History ?Social History  ? ?Tobacco Use  ? Smoking status: Never  ? Smokeless tobacco: Never  ?Vaping Use  ? Vaping Use: Never used  ?Substance Use Topics  ? Alcohol use: Yes  ?  Comment: less than weekly  ? Drug use: No  ? ? ? ?Allergies   ?Patient has no known allergies. ? ? ?Review of Systems ?Review of Systems ? ?See HPI ?Physical Exam ?Triage Vital Signs ?ED Triage Vitals  ?Enc Vitals Group  ?   BP 07/16/21 1918 129/87  ?   Pulse Rate 07/16/21 1918 82  ?   Resp 07/16/21 1918 18  ?   Temp 07/16/21 1918 98 ?F (36.7 ?C)  ?   Temp Source 07/16/21 1918 Oral  ?   SpO2 07/16/21 1918 96 %  ?   Weight --   ?   Height --   ?   Head Circumference --   ?   Peak Flow --   ?   Pain Score 07/16/21 1921 1  ?   Pain Loc --   ?   Pain Edu? --   ?   Excl. in [place]? --   ? ?No data found. ? ?Updated Vital Signs ?BP 129/87 (BP Location: Right Arm)   Pulse 82   Temp 98 ?F (36.7 ?C) (Oral)   Resp 18   SpO2 96%  ?:    ? ?Physical Exam ?Constitutional:   ?   General: He is not in acute distress. ?   Appearance: He is well-developed.  ?   Comments: Pleasant.  overweight  ?HENT:  ?   Head: Normocephalic and atraumatic.  ?   Mouth/Throat:  ?   Comments: Mask in place ?Eyes:  ?   Conjunctiva/sclera: Conjunctivae normal.  ?   Pupils: Pupils are equal, round, and reactive to light.  ?Cardiovascular:  ?   Rate and Rhythm: Normal rate.  ?   Heart sounds: Normal heart sounds.  ?Pulmonary:  ?   Effort: Pulmonary effort is normal. No respiratory distress.  ?   Breath sounds: Normal breath sounds. No wheezing.  ?Abdominal:  ?   General: There is no distension.  ?   Palpations: Abdomen is soft.  ?Musculoskeletal:     ?   General: Normal range of motion.  ?   Cervical back: Normal range of motion.  ?Skin: ?   General: Skin is warm and dry.  ?   Comments: On dorsum right hand there is a 6 cm indurated erythematous raised papule with central cluster of  vesicles .  Smaller lesion on left index finger and right cheek, also with STS  ?Neurological:  ?   Mental Status:  He is alert.  ? ? ? ?UC Treatments / Results  ?Labs ?(all labs ordered are listed, but only abnormal results are displayed) ?Labs Reviewed - No data to display ? ?EKG ? ? ?Radiology ?No results found. ? ?Procedures ?Procedures (including critical care time) ? ?Medications Ordered in UC ?Medications - No data to display ? ?Initial Impression / Assessment and Plan / UC Course  ?I have reviewed the triage vital signs and the nursing notes. ? ?Pertinent labs & imaging results that were available during my care of the patient were reviewed by me and considered in my medical decision making (see chart for details). ? ?  ? ?Final Clinical Impressions(s) / UC Diagnoses  ? ?Final diagnoses:  ?Allergic reaction, initial encounter  ?Insect bite, unspecified site, initial encounter  ? ? ? ?Discharge Instructions   ? ?  ?Take an antihistamine like zyrtec or Claritin once a day ?May take benadryl at bedtime ?Take prednisone once a day for 5 days, until bites resolve ? ? ? ? ?ED Prescriptions   ? ? Medication Sig Dispense Auth. Provider  ? predniSONE (DELTASONE) 20 MG tablet Take 2 tablets (40 mg total) by mouth daily in the afternoon. 10 tablet Raylene Everts, MD  ? ?  ? ?PDMP not reviewed this encounter. ?  ?Raylene Everts, MD ?07/16/21 1955 ? ?

## 2021-07-16 NOTE — Discharge Instructions (Signed)
Take an antihistamine like zyrtec or Claritin once a day ?May take benadryl at bedtime ?Take prednisone once a day for 5 days, until bites resolve ?

## 2021-09-27 ENCOUNTER — Other Ambulatory Visit: Payer: Self-pay | Admitting: Family Medicine

## 2021-11-27 ENCOUNTER — Other Ambulatory Visit: Payer: Self-pay | Admitting: Family Medicine

## 2021-11-27 DIAGNOSIS — F4312 Post-traumatic stress disorder, chronic: Secondary | ICD-10-CM

## 2021-11-27 MED ORDER — DOXEPIN HCL 75 MG PO CAPS: 75.0000 mg | ORAL_CAPSULE | Freq: Every day | ORAL | 3 refills | Status: DC

## 2021-11-27 NOTE — Telephone Encounter (Signed)
Pt is no longer under Dr. Clovis Riley care and rx should be filled by pt's new PCP.

## 2021-12-13 ENCOUNTER — Ambulatory Visit: Payer: Managed Care, Other (non HMO) | Admitting: Family Medicine

## 2021-12-13 ENCOUNTER — Encounter: Payer: Self-pay | Admitting: Family Medicine

## 2021-12-13 VITALS — BP 137/77 | HR 63 | Ht 68.0 in | Wt 217.0 lb

## 2021-12-13 DIAGNOSIS — E119 Type 2 diabetes mellitus without complications: Secondary | ICD-10-CM

## 2021-12-13 DIAGNOSIS — E1159 Type 2 diabetes mellitus with other circulatory complications: Secondary | ICD-10-CM

## 2021-12-13 DIAGNOSIS — I152 Hypertension secondary to endocrine disorders: Secondary | ICD-10-CM

## 2021-12-13 DIAGNOSIS — F4312 Post-traumatic stress disorder, chronic: Secondary | ICD-10-CM

## 2021-12-13 DIAGNOSIS — F515 Nightmare disorder: Secondary | ICD-10-CM | POA: Diagnosis not present

## 2021-12-13 LAB — POCT UA - MICROALBUMIN
Albumin/Creatinine Ratio, Urine, POC: <30
Creatinine, POC: 200 mg/dL
Microalbumin Ur, POC: 30 mg/L

## 2021-12-13 LAB — POCT GLYCOSYLATED HEMOGLOBIN (HGB A1C): HbA1c, POC (controlled diabetic range): 5.4 % (ref 0.0–7.0)

## 2021-12-13 NOTE — Assessment & Plan Note (Signed)
Continues to maintain good control his diabetes.  He is able to continue to stick with diet and exercise changes I think we can consider this "cured" at this time.

## 2021-12-13 NOTE — Progress Notes (Signed)
Christopher [name] - 51 y.o. male MRN [id number]  Date of birth: 06/10/1970  Subjective No chief complaint on file.   HPI Christopher [name] is a 51 year old male here today for follow-up visit.  He had sleeve gastrectomy last year.  He has lost over 100 pounds.  Reports that he feels better and has more energy.  He is using a rowing machine at home.  He was having some pain in his right hip.  He has been trying to work through the [place] to see orthopedics.  He has seen Dr. Dianah Field before and had injection which was helpful.  He may schedule another of these.  Since his weight loss he has been able to discontinue all medications for his diabetes and hypertension.  A1c is remaining well-controlled.  He does continue on prazosin and doxepin for management of PTSD and insomnia symptoms.  ROS:  A comprehensive ROS was completed and negative except as noted per HPI  No Known Allergies  Past Medical History:  Diagnosis Date   Depression    Diabetes mellitus without complication ([place])    Hyperlipidemia    Hypertension    Hypogonadal obesity    Lumbar degenerative disc disease    Obesity    OSA (obstructive sleep apnea) 12/02/2013   CPAP therapy on 17 cm H2O with a Wide size Resmed Nasal Mask Mirage FX mask and heated humidification    Post traumatic stress disorder (PTSD)    Prediabetes 2018   Sleep apnea    cpap    Past Surgical History:  Procedure Laterality Date   VASECTOMY  08/2017    Social History   Socioeconomic History   Marital status: Single    Spouse name: Not on file   Number of children: Not on file   Years of education: Not on file   Highest education level: Not on file  Occupational History   Not on file  Tobacco Use   Smoking status: Never   Smokeless tobacco: Never  Vaping Use   Vaping Use: Never used  Substance and Sexual Activity   Alcohol use: Yes    Comment: less than weekly   Drug use: No   Sexual activity: Yes    Partners: Female  Other Topics Concern    Not on file  Social History Narrative   ** Merged History Encounter **       Social Determinants of Health   Financial Resource Strain: Not on file  Food Insecurity: Not on file  Transportation Needs: Not on file  Physical Activity: Not on file  Stress: Not on file  Social Connections: Not on file    Family History  Problem Relation Age of Onset   Prostate cancer Other    Colon cancer Neg Hx    Colon polyps Neg Hx    Esophageal cancer Neg Hx    Rectal cancer Neg Hx    Stomach cancer Neg Hx     Health Maintenance  Topic Date Due   OPHTHALMOLOGY EXAM  12/11/2021   COVID-19 Vaccine (3 - Pfizer series) 03/15/2022 (Originally 09/23/2019)   INFLUENZA VACCINE  08/04/2022 (Originally 12/04/2021)   Hepatitis C Screening  12/14/2022 (Originally 07/10/1987)   HIV Screening  12/14/2022 (Originally 07/09/1984)   Diabetic kidney evaluation - GFR measurement  06/14/2022   HEMOGLOBIN A1C  06/15/2022   Diabetic kidney evaluation - Urine ACR  12/14/2022   FOOT EXAM  12/14/2022   TETANUS/TDAP  08/10/2024   COLONOSCOPY (Pts 45-49yr Insurance coverage will need to  be confirmed)  12/16/2024   Zoster Vaccines- Shingrix  Completed   HPV VACCINES  Aged Out     ----------------------------------------------------------------------------------------------------------------------------------------------------------------------------------------------------------------- Physical Exam BP 137/77 (BP Location: Left Arm, Patient Position: Sitting, Cuff Size: Large)   Pulse 63   Ht '5\' 8"'$  (1.727 m)   Wt 217 lb (98.4 kg)   SpO2 98%   BMI 32.99 kg/m   Physical Exam Constitutional:      Appearance: Normal appearance.  Cardiovascular:     Rate and Rhythm: Normal rate and regular rhythm.  Pulmonary:     Effort: Pulmonary effort is normal.     Breath sounds: Normal breath sounds.  Musculoskeletal:     Cervical back: Neck supple.  Neurological:     Mental Status: He is alert.  Psychiatric:         Mood and Affect: Mood normal.        Behavior: Behavior normal.     ------------------------------------------------------------------------------------------------------------------------------------------------------------------------------------------------------------------- Assessment and Plan  Hypertension associated with diabetes ([place]) Blood pressure remains well-controlled at this time.  Controlled type 2 diabetes mellitus without complication, without long-term current use of insulin ([place]) Continues to maintain good control his diabetes.  He is able to continue to stick with diet and exercise changes I think we can consider this "cured" at this time.  Nightmares associated with chronic post-traumatic stress disorder Continue prazosin and doxepin at current strength.   No orders of the defined types were placed in this encounter.   Return in about 6 months (around 06/15/2022) for Blood sugar.    This visit occurred during the SARS-CoV-2 public health emergency.  Safety protocols were in place, including screening questions prior to the visit, additional usage of staff PPE, and extensive cleaning of exam room while observing appropriate contact time as indicated for disinfecting solutions.

## 2021-12-13 NOTE — Assessment & Plan Note (Signed)
Blood pressure remains well-controlled at this time.

## 2021-12-13 NOTE — Assessment & Plan Note (Signed)
Continue prazosin and doxepin at current strength.

## 2021-12-18 ENCOUNTER — Ambulatory Visit: Payer: Managed Care, Other (non HMO) | Admitting: Sports Medicine

## 2021-12-26 ENCOUNTER — Ambulatory Visit: Payer: Managed Care, Other (non HMO)

## 2021-12-26 ENCOUNTER — Ambulatory Visit: Payer: Managed Care, Other (non HMO) | Admitting: Sports Medicine

## 2021-12-26 DIAGNOSIS — M1612 Unilateral primary osteoarthritis, left hip: Secondary | ICD-10-CM | POA: Diagnosis not present

## 2021-12-26 DIAGNOSIS — M160 Bilateral primary osteoarthritis of hip: Secondary | ICD-10-CM

## 2021-12-26 MED ORDER — TRAMADOL HCL 50 MG PO TABS: 50.0000 mg | ORAL_TABLET | Freq: Three times a day (TID) | ORAL | 0 refills | Status: DC | PRN

## 2021-12-26 NOTE — Progress Notes (Signed)
    Procedures performed today:    None.  Independent interpretation of notes and tests performed by another provider:   None.  Brief History, Exam, Impression, and Recommendations:    Primary osteoarthritis of both hips This is a very pleasant 51 year old male, he has known hip osteoarthritis, he also had bariatric surgery since I last saw him and has lost 130 pounds. Congratulated, we will not be able to use NSAIDs due to gastric bypass. He is having a flare in left hip pain, he localizes the pain anterolaterally but it is reproducible with internal rotation of the hip. His last hip joint injection was about a year ago, we are going to do tramadol and some home conditioning, updated x-rays, and if insufficient improvement after the next 2 to 6 weeks we will do an additional hip joint injection.    ____________________________________________ Gwen Her. Dianah Field, M.D., ABFM., CAQSM., AME. Primary Care and Sports Medicine [facility]  Adjunct Professor of [employer] [facility] [employer] [employer] [facility] of Medicine  Risk manager

## 2021-12-26 NOTE — Assessment & Plan Note (Addendum)
This is a very pleasant 51 year old male, he has known hip osteoarthritis, he also had bariatric surgery since I last saw him and has lost 130 pounds. Congratulated, we will not be able to use NSAIDs due to gastric bypass. He is having a flare in left hip pain, he localizes the pain anterolaterally but it is reproducible with internal rotation of the hip. His last hip joint injection was about a year ago, we are going to do tramadol and some home conditioning, updated x-rays, and if insufficient improvement after the next 2 to 6 weeks we will do an additional hip joint injection.

## 2022-01-24 ENCOUNTER — Ambulatory Visit: Payer: Managed Care, Other (non HMO) | Admitting: Family Medicine

## 2022-01-24 ENCOUNTER — Encounter: Payer: Self-pay | Admitting: Family Medicine

## 2022-01-24 VITALS — BP 141/77 | HR 56 | Ht 68.0 in | Wt 217.0 lb

## 2022-01-24 DIAGNOSIS — Z23 Encounter for immunization: Secondary | ICD-10-CM | POA: Diagnosis not present

## 2022-01-24 DIAGNOSIS — F4312 Post-traumatic stress disorder, chronic: Secondary | ICD-10-CM

## 2022-01-24 DIAGNOSIS — F515 Nightmare disorder: Secondary | ICD-10-CM
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
## 2022-02-06 ENCOUNTER — Ambulatory Visit: Payer: Managed Care, Other (non HMO) | Admitting: Sports Medicine

## 2022-06-13 ENCOUNTER — Encounter: Payer: Self-pay | Admitting: Pharmacist

## 2022-06-18 ENCOUNTER — Ambulatory Visit: Payer: Managed Care, Other (non HMO) | Admitting: Family Medicine

## 2022-06-18 ENCOUNTER — Encounter: Payer: Self-pay | Admitting: Family Medicine

## 2022-06-18 VITALS — BP 127/79 | HR 70 | Ht 68.0 in | Wt 229.0 lb

## 2022-06-18 DIAGNOSIS — F4312 Post-traumatic stress disorder, chronic: Secondary | ICD-10-CM

## 2022-06-18 DIAGNOSIS — E1169 Type 2 diabetes mellitus with other specified complication: Secondary | ICD-10-CM | POA: Diagnosis not present

## 2022-06-18 DIAGNOSIS — E1159 Type 2 diabetes mellitus with other circulatory complications: Secondary | ICD-10-CM | POA: Diagnosis not present

## 2022-06-18 DIAGNOSIS — F515 Nightmare disorder: Secondary | ICD-10-CM

## 2022-06-18 DIAGNOSIS — E785 Hyperlipidemia, unspecified: Secondary | ICD-10-CM

## 2022-06-18 DIAGNOSIS — E119 Type 2 diabetes mellitus without complications: Secondary | ICD-10-CM | POA: Diagnosis not present

## 2022-06-18 DIAGNOSIS — Z903 Acquired absence of stomach [part of]: Secondary | ICD-10-CM | POA: Diagnosis not present

## 2022-06-18 DIAGNOSIS — M545 Low back pain, unspecified: Secondary | ICD-10-CM | POA: Insufficient documentation

## 2022-06-18 DIAGNOSIS — F4310 Post-traumatic stress disorder, unspecified: Secondary | ICD-10-CM | POA: Insufficient documentation

## 2022-06-18 DIAGNOSIS — I152 Hypertension secondary to endocrine disorders: Secondary | ICD-10-CM

## 2022-06-18 NOTE — Assessment & Plan Note (Signed)
BP remains well controlled off of medications.  Continue to monitor.

## 2022-06-18 NOTE — Assessment & Plan Note (Signed)
Overall he has done well.  Weight is back up some due to increased stress.  Encouraged to re-incorporate regular exercise as this may help with stress some as well.  Updating labs today.

## 2022-06-18 NOTE — Assessment & Plan Note (Signed)
Continue on Prazosin.  Seeing a therapist through the [place].

## 2022-06-18 NOTE — Assessment & Plan Note (Signed)
This has been well controlled since his weight loss surgery. Update A1c and microalbumin today.

## 2022-06-18 NOTE — Progress Notes (Signed)
[name] - 52 y.o. male MRN [id number]  Date of birth: May 09, 1970  Subjective Chief Complaint  Patient presents with   Follow-up    6 month f/u    HPI Christopher [name] is a 52 y.o. male here today for follow up visit.   He reports that he is doing well. BP remains well controlled.  No medication at this time.  Remains on atorvastatin.  Tolerating well.  Due for updated labs.  Weight is up some since last visit.  He reports that his stress has increased over the past few months.  His mother had a stroke in December and he has been driving back and forth to [place] to help care for her.  He has been seeing a therapist through the [place].  He feels like diet is has not been as good.  His activity level is decreased.    ROS:  A comprehensive ROS was completed and negative except as noted per HPI  No Known Allergies  Past Medical History:  Diagnosis Date   Depression    Diabetes mellitus without complication ([place])    Hyperlipidemia    Hypertension    Hypogonadal obesity    Lumbar degenerative disc disease    Obesity    OSA (obstructive sleep apnea) 12/02/2013   CPAP therapy on 17 cm H2O with a Wide size Resmed Nasal Mask Mirage FX mask and heated humidification    Post traumatic stress disorder (PTSD)    Prediabetes 2018   Sleep apnea    cpap    Past Surgical History:  Procedure Laterality Date   VASECTOMY  08/2017    Social History   Socioeconomic History   Marital status: Single    Spouse name: Not on file   Number of children: Not on file   Years of education: Not on file   Highest education level: Not on file  Occupational History   Not on file  Tobacco Use   Smoking status: Never   Smokeless tobacco: Never  Vaping Use   Vaping Use: Never used  Substance and Sexual Activity   Alcohol use: Yes    Comment: less than weekly   Drug use: No   Sexual activity: Yes    Partners: Female  Other Topics Concern   Not on file  Social History Narrative   ** Merged  History Encounter **       Social Determinants of Health   Financial Resource Strain: Not on file  Food Insecurity: Not on file  Transportation Needs: Not on file  Physical Activity: Not on file  Stress: Not on file  Social Connections: Not on file    Family History  Problem Relation Age of Onset   Prostate cancer Other    Colon cancer Neg Hx    Colon polyps Neg Hx    Esophageal cancer Neg Hx    Rectal cancer Neg Hx    Stomach cancer Neg Hx     Health Maintenance  Topic Date Due   OPHTHALMOLOGY EXAM  12/11/2021   Diabetic kidney evaluation - eGFR measurement  06/14/2022   HEMOGLOBIN A1C  06/15/2022   Hepatitis C Screening  12/14/2022 (Originally 07/10/1987)   HIV Screening  12/14/2022 (Originally 07/09/1984)   Diabetic kidney evaluation - Urine ACR  12/14/2022   FOOT EXAM  12/14/2022   DTaP/Tdap/Td (4 - Td or Tdap) 08/10/2024   COLONOSCOPY (Pts 45-91yr Insurance coverage will need to be confirmed)  12/16/2024   INFLUENZA VACCINE  Completed  COVID-19 Vaccine  Completed   Zoster Vaccines- Shingrix  Completed   HPV VACCINES  Aged Out     ----------------------------------------------------------------------------------------------------------------------------------------------------------------------------------------------------------------- Physical Exam BP 127/79 (BP Location: Left Arm, Patient Position: Sitting, Cuff Size: Large)   Pulse 70   Ht 5' 8"$  (1.727 m)   Wt 229 lb (103.9 kg)   SpO2 97%   BMI 34.82 kg/m   Physical Exam Constitutional:      Appearance: Normal appearance.  HENT:     Head: Normocephalic and atraumatic.  Eyes:     General: No scleral icterus. Cardiovascular:     Rate and Rhythm: Normal rate and regular rhythm.  Pulmonary:     Effort: Pulmonary effort is normal.     Breath sounds: Normal breath sounds.  Musculoskeletal:     Cervical back: Neck supple.  Neurological:     General: No focal deficit present.     Mental Status: He is  alert.  Psychiatric:        Mood and Affect: Mood normal.        Behavior: Behavior normal.     ------------------------------------------------------------------------------------------------------------------------------------------------------------------------------------------------------------------- Assessment and Plan  Status post sleeve gastrectomy Overall he has done well.  Weight is back up some due to increased stress.  Encouraged to re-incorporate regular exercise as this may help with stress some as well.  Updating labs today.   Nightmares associated with chronic post-traumatic stress disorder Continue on Prazosin.  Seeing a therapist through the [place].   Hypertension associated with diabetes ([place]) BP remains well controlled off of medications.  Continue to monitor.   Controlled type 2 diabetes mellitus without complication, without long-term current use of insulin ([place]) This has been well controlled since his weight loss surgery. Update A1c and microalbumin today.    No orders of the defined types were placed in this encounter.   Return in about 6 months (around 12/17/2022) for T2DM.    This visit occurred during the SARS-CoV-2 public health emergency.  Safety protocols were in place, including screening questions prior to the visit, additional usage of staff PPE, and extensive cleaning of exam room while observing appropriate contact time as indicated for disinfecting solutions.

## 2022-07-04 LAB — COMPLETE METABOLIC PANEL WITH GFR
AG Ratio: 1.3 (calc) (ref 1.0–2.5)
ALT: 40 U/L (ref 9–46)
AST: 23 U/L (ref 10–35)
Albumin: 4.2 g/dL (ref 3.6–5.1)
Alkaline phosphatase (APISO): 97 U/L (ref 35–144)
BUN: 12 mg/dL (ref 7–25)
CO2: 30 mmol/L (ref 20–32)
Calcium: 9.5 mg/dL (ref 8.6–10.3)
Chloride: 106 mmol/L (ref 98–110)
Creat: 0.86 mg/dL (ref 0.70–1.30)
Globulin: 3.2 g/dL (calc) (ref 1.9–3.7)
Glucose, Bld: 90 mg/dL (ref 65–99)
Potassium: 4.2 mmol/L (ref 3.5–5.3)
Sodium: 143 mmol/L (ref 135–146)
Total Bilirubin: 0.6 mg/dL (ref 0.2–1.2)
Total Protein: 7.4 g/dL (ref 6.1–8.1)
eGFR: 104 mL/min/{1.73_m2} (ref >=60)

## 2022-07-04 LAB — CBC WITH DIFFERENTIAL/PLATELET
Absolute Monocytes: 217 cells/uL (ref 200–950)
Basophils Absolute: 19 cells/uL (ref 0–200)
Basophils Relative: 0.5 %
Eosinophils Absolute: 38 cells/uL (ref 15–500)
Eosinophils Relative: 1 %
HCT: 45.5 % (ref 38.5–50.0)
Hemoglobin: 15.1 g/dL (ref 13.2–17.1)
Lymphs Abs: 1436 cells/uL (ref 850–3900)
MCH: 28 pg (ref 27.0–33.0)
MCHC: 33.2 g/dL (ref 32.0–36.0)
MCV: 84.4 fL (ref 80.0–100.0)
MPV: 10.7 fL (ref 7.5–12.5)
Monocytes Relative: 5.7 %
Neutro Abs: 2090 cells/uL (ref 1500–7800)
Neutrophils Relative %: 55 %
Platelets: 237 10*3/uL (ref 140–400)
RBC: 5.39 10*6/uL (ref 4.20–5.80)
RDW: 14.4 % (ref 11.0–15.0)
Total Lymphocyte: 37.8 %
WBC: 3.8 10*3/uL (ref 3.8–10.8)

## 2022-07-04 LAB — HEMOGLOBIN A1C
Hgb A1c MFr Bld: 5.8 % of total Hgb — ABNORMAL HIGH (ref <5.7)
Mean Plasma Glucose: 120 mg/dL
eAG (mmol/L): 6.6 mmol/L

## 2022-07-04 LAB — MICROALBUMIN / CREATININE URINE RATIO
Creatinine, Urine: 130 mg/dL (ref 20–320)
Microalb Creat Ratio: 6 mcg/mg creat (ref <30)
Microalb, Ur: 0.8 mg/dL

## 2022-07-04 LAB — LIPID PANEL W/REFLEX DIRECT LDL
Cholesterol: 130 mg/dL (ref <200)
HDL: 64 mg/dL (ref >=40)
LDL Cholesterol (Calc): 52 mg/dL (calc)
Non-HDL Cholesterol (Calc): 66 mg/dL (calc) (ref <130)
Total CHOL/HDL Ratio: 2 (calc) (ref <5.0)
Triglycerides: 68 mg/dL (ref <150)

## 2022-07-04 LAB — IRON,TIBC AND FERRITIN PANEL
%SAT: 32 % (calc) (ref 20–48)
Ferritin: 159 ng/mL (ref 38–380)
Iron: 95 ug/dL (ref 50–180)
TIBC: 296 mcg/dL (calc) (ref 250–425)

## 2022-07-04 LAB — VITAMIN D 25 HYDROXY (VIT D DEFICIENCY, FRACTURES): Vit D, 25-Hydroxy: 40 ng/mL (ref 30–100)

## 2022-07-04 LAB — ZINC: Zinc: 60 ug/dL (ref 60–130)

## 2022-07-04 LAB — VITAMIN B12: Vitamin B-12: 439 pg/mL (ref 200–1100)

## 2022-12-09 ENCOUNTER — Telehealth: Payer: Self-pay | Admitting: General Practice

## 2022-12-09 NOTE — Transitions of Care (Post Inpatient/ED Visit) (Signed)
   12/09/2022  Name: Christopher [name] MRN: [id number] DOB: 1971/03/09  Today's TOC FU Call Status: Today's TOC FU Call Status:: Successful TOC FU Call Completed TOC FU Call Complete Date: 12/09/22  Transition Care Management Follow-up Telephone Call Date of Discharge: 12/08/22 Discharge Facility: Other ([facility]) Name of Other ([facility]) Discharge Facility: [facility] Type of Discharge: Emergency Department Reason for ED Visit: Orthopedic Conditions Orthopedic/Injury Diagnosis:  (knee injury) How have you been since you were released from the hospital?: Better Any questions or concerns?: No  Items Reviewed: Did you receive and understand the discharge instructions provided?: Yes Medications obtained,verified, and reconciled?: Yes (Medications Reviewed) Any new allergies since your discharge?: No Dietary orders reviewed?: NA Do you have support at home?: Yes  Medications Reviewed Today: Medications Reviewed Today   Medications were not reviewed in this encounter     Home Care and Equipment/Supplies: Were Home Health Services Ordered?: NA Any new equipment or medical supplies ordered?: NA  Functional Questionnaire: Do you need assistance with bathing/showering or dressing?: No Do you need assistance with meal preparation?: No Do you need assistance with eating?: No Do you have difficulty maintaining continence: No Do you need assistance with getting out of bed/getting out of a chair/moving?: No Do you have difficulty managing or taking your medications?: No  Follow up appointments reviewed: PCP Follow-up appointment confirmed?: No MD Provider Line Number:[phone] Given: No (patient stated that he will call back to make an appointment.) Specialist Hospital Follow-up appointment confirmed?: NA Do you need transportation to your follow-up appointment?: No Do you understand care options if your condition(s) worsen?: Yes-patient verbalized understanding  SDOH  Interventions Today    Flowsheet Row Most Recent Value  SDOH Interventions   Transportation Interventions Intervention Not Indicated       SIGNATURE Modesto Charon, RN BSN Nurse Health Advisor

## 2022-12-18 ENCOUNTER — Ambulatory Visit: Payer: Managed Care, Other (non HMO) | Admitting: Family Medicine

## 2022-12-18 ENCOUNTER — Encounter: Payer: Self-pay | Admitting: Family Medicine

## 2022-12-18 VITALS — BP 136/86 | HR 62 | Ht 68.0 in | Wt 229.0 lb

## 2022-12-18 DIAGNOSIS — E1159 Type 2 diabetes mellitus with other circulatory complications: Secondary | ICD-10-CM

## 2022-12-18 DIAGNOSIS — E1169 Type 2 diabetes mellitus with other specified complication: Secondary | ICD-10-CM | POA: Diagnosis not present

## 2022-12-18 DIAGNOSIS — I152 Hypertension secondary to endocrine disorders: Secondary | ICD-10-CM | POA: Diagnosis not present

## 2022-12-18 DIAGNOSIS — E119 Type 2 diabetes mellitus without complications: Secondary | ICD-10-CM | POA: Diagnosis not present

## 2022-12-18 DIAGNOSIS — E785 Hyperlipidemia, unspecified: Secondary | ICD-10-CM

## 2022-12-18 LAB — POCT GLYCOSYLATED HEMOGLOBIN (HGB A1C): HbA1c, POC (controlled diabetic range): 5.8 % (ref 0.0–7.0)

## 2022-12-18 NOTE — Assessment & Plan Note (Signed)
Doing well with atorvastatin.  Continue at current strength.

## 2022-12-18 NOTE — Assessment & Plan Note (Signed)
BP remains well controlled.  Low sodium diet encouraged.

## 2022-12-18 NOTE — Assessment & Plan Note (Signed)
Blood sugars remained well controlled.  Low carbohydrate diet encouraged.

## 2022-12-18 NOTE — Progress Notes (Signed)
Christopher [name] - 52 y.o. male MRN [id number]  Date of birth: 1970/09/11  Subjective Chief Complaint  Patient presents with   Diabetes    HPI Christopher [name] is a 52 y.o. male here today for follow up.   He recently went on a cruise and unfortunately caught COVID.  He has recovered well from this.   His blood pressure has remained well controlled off medication.  Weight is up about 14 lbs since previous visit.   He is no longer on any medications for diabetes.  A1c increased slightly at last visit.    Remains on atorvastatin for HLD.  Tolerating this well.   ROS:  A comprehensive ROS was completed and negative except as noted per HPI  No Known Allergies  Past Medical History:  Diagnosis Date   Depression    Diabetes mellitus without complication (HCC)    Hyperlipidemia    Hypertension    Hypogonadal obesity    Lumbar degenerative disc disease    Obesity    OSA (obstructive sleep apnea) 12/02/2013   CPAP therapy on 17 cm H2O with a Wide size Resmed Nasal Mask Mirage FX mask and heated humidification    Post traumatic stress disorder (PTSD)    Prediabetes 2018   Sleep apnea    cpap    Past Surgical History:  Procedure Laterality Date   VASECTOMY  08/2017    Social History   Socioeconomic History   Marital status: Single    Spouse name: Not on file   Number of children: Not on file   Years of education: Not on file   Highest education level: Not on file  Occupational History   Not on file  Tobacco Use   Smoking status: Never   Smokeless tobacco: Never  Vaping Use   Vaping status: Never Used  Substance and Sexual Activity   Alcohol use: Yes    Comment: less than weekly   Drug use: No   Sexual activity: Yes    Partners: Female  Other Topics Concern   Not on file  Social History Narrative   ** Merged History Encounter **       Social Determinants of Health   Financial Resource Strain: Low Risk  (01/22/2021)   Received from [facility], [facility]    Overall Financial Resource Strain (CARDIA)    Difficulty of Paying Living Expenses: Not very hard  Food Insecurity: No Food Insecurity (08/03/2021)   Received from [facility], [facility]   Hunger Vital Sign    Worried About Running Out of Food in the Last Year: Never true    Ran Out of Food in the Last Year: Never true  Transportation Needs: No Transportation Needs (12/09/2022)   PRAPARE - Administrator, Civil Service (Medical): No    Lack of Transportation (Non-Medical): No  Physical Activity: Not on file  Stress: No Stress Concern Present (01/22/2021)   Received from [facility], [facility] of Occupational [facility] - Occupational Stress Questionnaire    Feeling of Stress : Not at all  Social Connections: Unknown (09/04/2021)   Received from [facility], [facility]   Social Network    Social Network: Not on file    Family History  Problem Relation Age of Onset   Prostate cancer Other    Colon cancer Neg Hx    Colon polyps Neg Hx    Esophageal cancer Neg Hx    Rectal cancer Neg Hx  Stomach cancer Neg Hx     Health Maintenance  Topic Date Due   HEMOGLOBIN A1C  12/17/2022   OPHTHALMOLOGY EXAM  04/19/2023 (Originally 12/11/2021)   INFLUENZA VACCINE  08/04/2023 (Originally 12/05/2022)   Hepatitis C Screening  12/18/2023 (Originally 07/10/1987)   HIV Screening  12/18/2023 (Originally 07/09/1984)   Diabetic kidney evaluation - eGFR measurement  06/19/2023   Diabetic kidney evaluation - Urine ACR  06/19/2023   FOOT EXAM  12/18/2023   DTaP/Tdap/Td (4 - Td or Tdap) 08/10/2024   Colonoscopy  12/16/2024   COVID-19 Vaccine  Completed   Zoster Vaccines- Shingrix  Completed   HPV VACCINES  Aged Out     ----------------------------------------------------------------------------------------------------------------------------------------------------------------------------------------------------------------- Physical Exam BP 136/86 (BP  Location: Left Arm, Patient Position: Sitting, Cuff Size: Large)   Pulse 62   Ht 5\' 8"  (1.727 m)   Wt 229 lb (103.9 kg)   SpO2 98%   BMI 34.82 kg/m   Physical Exam Constitutional:      Appearance: Normal appearance.  Eyes:     General: No scleral icterus. Cardiovascular:     Rate and Rhythm: Normal rate and regular rhythm.  Pulmonary:     Effort: Pulmonary effort is normal.     Breath sounds: Normal breath sounds.  Neurological:     Mental Status: He is alert.  Psychiatric:        Mood and Affect: Mood normal.        Behavior: Behavior normal.     ------------------------------------------------------------------------------------------------------------------------------------------------------------------------------------------------------------------- Assessment and Plan  Hypertension associated with diabetes (HCC) BP remains well controlled.  Low sodium diet encouraged.   Controlled type 2 diabetes mellitus without complication, without long-term current use of insulin (HCC) Blood sugars remained well controlled.  Low carbohydrate diet encouraged.   Hyperlipidemia associated with type 2 diabetes mellitus (HCC) Doing well with atorvastatin.  Continue at current strength.    No orders of the defined types were placed in this encounter.   No follow-ups on file.    This visit occurred during the SARS-CoV-2 public health emergency.  Safety protocols were in place, including screening questions prior to the visit, additional usage of staff PPE, and extensive cleaning of exam room while observing appropriate contact time as indicated for disinfecting solutions.

## 2023-01-23 ENCOUNTER — Other Ambulatory Visit: Payer: Self-pay | Admitting: Family Medicine

## 2023-02-19 LAB — HM DIABETES EYE EXAM

## 2023-04-21 ENCOUNTER — Other Ambulatory Visit: Payer: Self-pay | Admitting: Family Medicine

## 2023-06-24 ENCOUNTER — Ambulatory Visit: Payer: Managed Care, Other (non HMO) | Admitting: Family Medicine

## 2023-07-02 ENCOUNTER — Ambulatory Visit: Payer: Managed Care, Other (non HMO) | Admitting: Family Medicine

## 2023-07-02 ENCOUNTER — Encounter: Payer: Self-pay | Admitting: Family Medicine

## 2023-07-02 VITALS — BP 125/79 | HR 79 | Ht 68.0 in | Wt 245.0 lb

## 2023-07-02 DIAGNOSIS — E119 Type 2 diabetes mellitus without complications: Secondary | ICD-10-CM

## 2023-07-02 DIAGNOSIS — R443 Hallucinations, unspecified: Secondary | ICD-10-CM | POA: Insufficient documentation

## 2023-07-02 DIAGNOSIS — F4312 Post-traumatic stress disorder, chronic: Secondary | ICD-10-CM

## 2023-07-02 DIAGNOSIS — F2089 Other schizophrenia: Secondary | ICD-10-CM | POA: Insufficient documentation

## 2023-07-02 DIAGNOSIS — F4310 Post-traumatic stress disorder, unspecified: Secondary | ICD-10-CM

## 2023-07-02 DIAGNOSIS — I152 Hypertension secondary to endocrine disorders: Secondary | ICD-10-CM | POA: Diagnosis not present

## 2023-07-02 DIAGNOSIS — Z23 Encounter for immunization: Secondary | ICD-10-CM

## 2023-07-02 DIAGNOSIS — E1169 Type 2 diabetes mellitus with other specified complication: Secondary | ICD-10-CM

## 2023-07-02 DIAGNOSIS — M160 Bilateral primary osteoarthritis of hip: Secondary | ICD-10-CM

## 2023-07-02 DIAGNOSIS — F29 Unspecified psychosis not due to a substance or known physiological condition: Secondary | ICD-10-CM | POA: Insufficient documentation

## 2023-07-02 DIAGNOSIS — E785 Hyperlipidemia, unspecified: Secondary | ICD-10-CM

## 2023-07-02 DIAGNOSIS — E1159 Type 2 diabetes mellitus with other circulatory complications: Secondary | ICD-10-CM | POA: Diagnosis not present

## 2023-07-02 DIAGNOSIS — M1632 Unilateral osteoarthritis resulting from hip dysplasia, left hip: Secondary | ICD-10-CM | POA: Insufficient documentation

## 2023-07-02 LAB — POCT GLYCOSYLATED HEMOGLOBIN (HGB A1C): HbA1c, POC (controlled diabetic range): 5.6 % (ref 0.0–7.0)

## 2023-07-02 MED ORDER — TRAMADOL HCL 50 MG PO TABS
50.0000 mg | ORAL_TABLET | Freq: Three times a day (TID) | ORAL | 0 refills | Status: DC | PRN
Start: 2023-07-02 — End: 2023-07-02

## 2023-07-02 MED ORDER — EPINEPHRINE 0.3 MG/0.3ML IJ SOAJ: 0.3000 mg | INTRAMUSCULAR | 3 refills | Status: AC | PRN

## 2023-07-02 MED ORDER — TRAMADOL HCL 50 MG PO TABS: 50.0000 mg | ORAL_TABLET | Freq: Three times a day (TID) | ORAL | 0 refills | Status: AC | PRN

## 2023-07-02 MED ORDER — DOXEPIN HCL 75 MG PO CAPS: 75.0000 mg | ORAL_CAPSULE | Freq: Every day | ORAL | 3 refills | Status: AC

## 2023-07-02 MED ORDER — PRAZOSIN HCL 1 MG PO CAPS: 1.0000 mg | ORAL_CAPSULE | Freq: Every day | ORAL | 3 refills | Status: AC

## 2023-07-02 NOTE — Assessment & Plan Note (Signed)
 Blood sugars remained well controlled.  Low carbohydrate diet encouraged.

## 2023-07-02 NOTE — Assessment & Plan Note (Signed)
 Doing well with atorvastatin.  Continue at current strength.

## 2023-07-02 NOTE — Assessment & Plan Note (Signed)
 This is a managed by psychiatry through the [place].  Stable at this time with current medications.

## 2023-07-02 NOTE — Progress Notes (Signed)
 Christopher [name] - 53 y.o. male MRN [id number]  Date of birth: Sep 10, 1970  Subjective Chief Complaint  Patient presents with   Immunizations   Medical Management of Chronic Issues    HPI Christopher [name] is a 53 y.o. male here today for follow-up visit.  He reports he is doing well.  He is seeing psychiatry through the [place].  Stable at this time.  Blood sugars have remained well-controlled.  A1c today is 5.6%.  He has tried to stay moderately active.  Blood pressure is well-controlled at this time.  He is no longer on any medications for blood pressure.  ROS:  A comprehensive ROS was completed and negative except as noted per HPI  No Known Allergies  Past Medical History:  Diagnosis Date   Depression    Diabetes mellitus without complication (HCC)    Hyperlipidemia    Hypertension    Hypogonadal obesity    Lumbar degenerative disc disease    Obesity    OSA (obstructive sleep apnea) 12/02/2013   CPAP therapy on 17 cm H2O with a Wide size Resmed Nasal Mask Mirage FX mask and heated humidification    Post traumatic stress disorder (PTSD)    Prediabetes 2018   Sleep apnea    cpap    Past Surgical History:  Procedure Laterality Date   VASECTOMY  08/2017    Social History   Socioeconomic History   Marital status: Single    Spouse name: Not on file   Number of children: Not on file   Years of education: Not on file   Highest education level: Not on file  Occupational History   Not on file  Tobacco Use   Smoking status: Never   Smokeless tobacco: Never  Vaping Use   Vaping status: Never Used  Substance and Sexual Activity   Alcohol use: Yes    Comment: less than weekly   Drug use: No   Sexual activity: Yes    Partners: Female  Other Topics Concern   Not on file  Social History Narrative   ** Merged History Encounter **       Social Drivers of Health   Financial Resource Strain: Patient Declined (06/19/2023)   Received from [facility]   Overall Financial  Resource Strain (CARDIA)    Difficulty of Paying Living Expenses: Patient declined  Food Insecurity: Patient Declined (06/19/2023)   Received from [facility]   Hunger Vital Sign    Worried About Running Out of Food in the Last Year: Patient declined    Ran Out of Food in the Last Year: Patient declined  Transportation Needs: Patient Declined (06/19/2023)   Received from [facility] - Transportation    Lack of Transportation (Medical): Patient declined    Lack of Transportation (Non-Medical): Patient declined  Physical Activity: Not on file  Stress: No Stress Concern Present (01/22/2021)   Received from [facility], [facility]   [employer] of Occupational Health - Occupational Stress Questionnaire    Feeling of Stress : Not at all  Social Connections: Unknown (09/04/2021)   Received from [facility], [facility]   Social Network    Social Network: Not on file    Family History  Problem Relation Age of Onset   Prostate cancer Other    Colon cancer Neg Hx    Colon polyps Neg Hx    Esophageal cancer Neg Hx    Rectal cancer Neg Hx    Stomach cancer Neg  Hx     Health Maintenance  Topic Date Due   OPHTHALMOLOGY EXAM  12/11/2021   Diabetic kidney evaluation - eGFR measurement  06/19/2023   Diabetic kidney evaluation - Urine ACR  06/19/2023   Hepatitis C Screening  12/18/2023 (Originally 07/10/1987)   HIV Screening  12/18/2023 (Originally 07/09/1984)   FOOT EXAM  12/18/2023   HEMOGLOBIN A1C  12/30/2023   DTaP/Tdap/Td (4 - Td or Tdap) 08/10/2024   Colonoscopy  12/16/2024   Pneumococcal Vaccine 65-47 Years old  Completed   INFLUENZA VACCINE  Completed   COVID-19 Vaccine  Completed   Zoster Vaccines- Shingrix  Completed   HPV VACCINES  Aged Out      ----------------------------------------------------------------------------------------------------------------------------------------------------------------------------------------------------------------- Physical Exam BP 125/79 (BP Location: Left Arm, Patient Position: Sitting, Cuff Size: Large)   Pulse 79   Ht 5\' 8"  (1.727 m)   Wt 245 lb (111.1 kg)   SpO2 96%   BMI 37.25 kg/m   Physical Exam Constitutional:      Appearance: Normal appearance.  HENT:     Head: Normocephalic and atraumatic.  Eyes:     General: No scleral icterus. Cardiovascular:     Rate and Rhythm: Normal rate and regular rhythm.  Pulmonary:     Effort: Pulmonary effort is normal.     Breath sounds: Normal breath sounds.  Neurological:     General: No focal deficit present.     Mental Status: He is alert.  Psychiatric:        Mood and Affect: Mood normal.        Behavior: Behavior normal.     ------------------------------------------------------------------------------------------------------------------------------------------------------------------------------------------------------------------- Assessment and Plan  Post traumatic stress disorder (PTSD) This is a managed by psychiatry through the [place].  Stable at this time with current medications.  Hypertension associated with diabetes (HCC) BP remains well controlled.  Low sodium diet encouraged.   Hyperlipidemia associated with type 2 diabetes mellitus (HCC) Doing well with atorvastatin.  Continue at current strength.   Controlled type 2 diabetes mellitus without complication, without long-term current use of insulin (HCC) Blood sugars remained well controlled.  Low carbohydrate diet encouraged.    Meds ordered this encounter  Medications   doxepin (SINEQUAN) 75 MG capsule    Sig: Take 1 capsule (75 mg total) by mouth at bedtime.    Dispense:  90 capsule    Refill:  3   EPINEPHrine 0.3 mg/0.3 mL IJ SOAJ injection    Sig:  Inject 0.3 mg into the muscle as needed for anaphylaxis.    Dispense:  1 each    Refill:  3   prazosin (MINIPRESS) 1 MG capsule    Sig: Take 1 capsule (1 mg total) by mouth at bedtime.    Dispense:  90 capsule    Refill:  3   DISCONTD: traMADol (ULTRAM) 50 MG tablet    Sig: Take 1 tablet (50 mg total) by mouth every 8 (eight) hours as needed for moderate pain (pain score 4-6).    Dispense:  21 tablet    Refill:  0   DISCONTD: traMADol (ULTRAM) 50 MG tablet    Sig: Take 1 tablet (50 mg total) by mouth every 8 (eight) hours as needed for moderate pain (pain score 4-6).    Dispense:  21 tablet    Refill:  0   traMADol (ULTRAM) 50 MG tablet    Sig: Take 1 tablet (50 mg total) by mouth every 8 (eight) hours as needed for moderate pain (pain score 4-6).  Dispense:  21 tablet    Refill:  0    Return in about 6 months (around 12/30/2023) for Hypertension.    This visit occurred during the SARS-CoV-2 public health emergency.  Safety protocols were in place, including screening questions prior to the visit, additional usage of staff PPE, and extensive cleaning of exam room while observing appropriate contact time as indicated for disinfecting solutions.

## 2023-07-02 NOTE — Assessment & Plan Note (Signed)
BP remains well controlled.  Low sodium diet encouraged.

## 2023-07-03 LAB — CBC WITH DIFFERENTIAL/PLATELET
Basophils Absolute: 0.1 10*3/uL (ref 0.0–0.2)
Basos: 1 %
EOS (ABSOLUTE): 0 10*3/uL (ref 0.0–0.4)
Eos: 1 %
Hematocrit: 44.8 % (ref 37.5–51.0)
Hemoglobin: 14.5 g/dL (ref 13.0–17.7)
Immature Grans (Abs): 0 10*3/uL (ref 0.0–0.1)
Immature Granulocytes: 0 %
Lymphocytes Absolute: 1.8 10*3/uL (ref 0.7–3.1)
Lymphs: 42 %
MCH: 27.4 pg (ref 26.6–33.0)
MCHC: 32.4 g/dL (ref 31.5–35.7)
MCV: 85 fL (ref 79–97)
Monocytes Absolute: 0.3 10*3/uL (ref 0.1–0.9)
Monocytes: 6 %
Neutrophils Absolute: 2.2 10*3/uL (ref 1.4–7.0)
Neutrophils: 50 %
Platelets: 263 10*3/uL (ref 150–450)
RBC: 5.29 x10E6/uL (ref 4.14–5.80)
RDW: 14.7 % (ref 11.6–15.4)
WBC: 4.4 10*3/uL (ref 3.4–10.8)

## 2023-07-03 LAB — CMP14+EGFR
ALT: 34 [IU]/L (ref 0–44)
AST: 25 [IU]/L (ref 0–40)
Albumin: 4.2 g/dL (ref 3.8–4.9)
Alkaline Phosphatase: 113 [IU]/L (ref 44–121)
BUN/Creatinine Ratio: 14 (ref 9–20)
BUN: 13 mg/dL (ref 6–24)
Bilirubin Total: 0.5 mg/dL (ref 0.0–1.2)
CO2: 23 mmol/L (ref 20–29)
Calcium: 9.3 mg/dL (ref 8.7–10.2)
Chloride: 107 mmol/L — ABNORMAL HIGH (ref 96–106)
Creatinine, Ser: 0.95 mg/dL (ref 0.76–1.27)
Globulin, Total: 3.1 g/dL (ref 1.5–4.5)
Glucose: 70 mg/dL (ref 70–99)
Potassium: 4.3 mmol/L (ref 3.5–5.2)
Sodium: 145 mmol/L — ABNORMAL HIGH (ref 134–144)
Total Protein: 7.3 g/dL (ref 6.0–8.5)
eGFR: 96 mL/min/{1.73_m2} (ref >59)

## 2023-07-03 LAB — LIPID PANEL
Chol/HDL Ratio: 2.5 {ratio} (ref 0.0–5.0)
Cholesterol, Total: 144 mg/dL (ref 100–199)
HDL: 57 mg/dL (ref >39)
LDL Chol Calc (NIH): 73 mg/dL (ref 0–99)
Triglycerides: 71 mg/dL (ref 0–149)
VLDL Cholesterol Cal: 14 mg/dL (ref 5–40)

## 2023-07-03 LAB — MICROALBUMIN / CREATININE URINE RATIO
Creatinine, Urine: 235.7 mg/dL
Microalb/Creat Ratio: 10 mg/g{creat} (ref 0–29)
Microalbumin, Urine: 23.3 ug/mL

## 2023-07-11 ENCOUNTER — Encounter: Payer: Self-pay | Admitting: Family Medicine

## 2023-07-16 ENCOUNTER — Ambulatory Visit
Admission: EM | Admit: 2023-07-16 | Discharge: 2023-07-16 | Disposition: A | Discharge status: Home / Self Care | Attending: Family Medicine | Admitting: Family Medicine

## 2023-07-16 DIAGNOSIS — R0981 Nasal congestion: Secondary | ICD-10-CM

## 2023-07-16 MED ORDER — PREDNISONE 20 MG PO TABS: ORAL_TABLET | ORAL | 0 refills | Status: DC

## 2023-07-16 NOTE — ED Triage Notes (Signed)
 Pt c/o loss of voice with mild sore throat, as well as congestion. Endorses mild cough. Symptoms began yesterday.

## 2023-07-16 NOTE — ED Provider Notes (Signed)
 [facility]    CSN: [id number] Arrival date & time: 07/16/23  1117      History   Chief Complaint Chief Complaint  Patient presents with   Hoarse   Nasal Congestion    HPI Christopher [name] is a 53 y.o. male.   HPI 53 year old male presents with sore throat.  PMH significant for obesity, OSA, and T2DM without complication.  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 07/02/2023   Post traumatic stress disorder (PTSD) 06/18/2022   Low back pain, unspecified 06/18/2022   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/05/2021   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/11/2021   GERD (gastroesophageal reflux disease) 08/02/2020   Right elbow pain 06/12/2020   Dysphagia 02/17/2020   Primary osteoarthritis of both hips 11/12/2018   Class 3 severe obesity due to excess calories with serious comorbidity in adult (HCC) 06/15/2017   Controlled type 2 diabetes mellitus without complication, without long-term current use of insulin (HCC) 06/15/2017   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   Hypertension associated with diabetes (HCC) 01/10/2017   Elevated hematocrit 11/24/2016   Hypogonadism in male 06/01/2016   Abnormal prostate specific antigen (PSA) 06/01/2016   Chronic post-traumatic stress disorder (PTSD) 05/30/2016   Nightmares associated with chronic post-traumatic stress disorder 05/30/2016   Lumbar degenerative disc disease 02/05/2016   OSA on CPAP  12/02/2013   Severe obesity (BMI >= 40) (HCC) 12/02/2013   Family history of prostate cancer 12/02/2013   Hyperlipidemia associated with type 2 diabetes mellitus (HCC)     Past Surgical History:  Procedure Laterality Date   VASECTOMY  08/2017       Home Medications    Prior to Admission medications   Medication Sig Start Date End Date Taking? Authorizing Provider  predniSONE (DELTASONE) 20 MG tablet Take 3 tabs PO daily x 5 days. 07/16/23  Yes Trevor Iha, FNP  AMBULATORY NON FORMULARY MEDICATION CPAP therapy on 17 cm H2O with a Wide size Resmed Nasal Mask Mirage FX mask and heated humidification. Use nightly for sleep.  Dx: Obstructive Sleep Apnea 06/30/18   Carlis Stable, PA-C  aprepitant (EMEND) 40 MG capsule Take 40 mg by mouth 2 (two) times daily. 01/05/21   [provider]  atorvastatin (LIPITOR) 20 MG tablet Take 1 tablet by mouth once daily 04/23/23   Everrett Coombe, DO  celecoxib (CELEBREX) 100 MG capsule Take by mouth. 10/15/22   [provider]  diclofenac Sodium (VOLTAREN) 1 % GEL Apply topically. 12/08/22   [provider]  doxepin (SINEQUAN) 75 MG capsule Take 1 capsule (75 mg total) by mouth at bedtime. 07/02/23   Everrett Coombe, DO  EPINEPHrine 0.3 mg/0.3 mL IJ SOAJ injection Inject 0.3 mg into the muscle as needed for anaphylaxis. 07/02/23   Everrett Coombe, DO  pantoprazole (PROTONIX) 40 MG tablet Take 1 tablet (40 mg total) by mouth  daily. 02/17/20   Everrett Coombe, DO  prazosin (MINIPRESS) 1 MG capsule Take 1 capsule (1 mg total) by mouth at bedtime. 07/02/23   Everrett Coombe, DO  traMADol (ULTRAM) 50 MG tablet Take 1 tablet (50 mg total) by mouth every 8 (eight) hours as needed for moderate pain (pain score 4-6). 07/02/23   Everrett Coombe, DO  WEGOVY 0.25 MG/0.5ML SOAJ Inject 0.25 mg into the skin once a week.    [provider]    Family History Family History  Problem Relation Age of Onset   Prostate cancer Other     Colon cancer Neg Hx    Colon polyps Neg Hx    Esophageal cancer Neg Hx    Rectal cancer Neg Hx    Stomach cancer Neg Hx     Social History Social History   Tobacco Use   Smoking status: Never   Smokeless tobacco: Never  Vaping Use   Vaping status: Never Used  Substance Use Topics   Alcohol use: Yes    Comment: less than weekly   Drug use: No     Allergies   Patient has no known allergies.   Review of Systems Review of Systems  HENT:  Positive for congestion, sore throat and voice change.   All other systems reviewed and are negative.    Physical Exam Triage Vital Signs ED Triage Vitals  Encounter Vitals Group     BP      Systolic BP Percentile      Diastolic BP Percentile      Pulse      Resp      Temp      Temp src      SpO2      Weight      Height      Head Circumference      Peak Flow      Pain Score      Pain Loc      Pain Education      Exclude from Growth Chart    No data found.  Updated Vital Signs BP (!) 143/84 (BP Location: Right Arm)   Pulse 71   Temp 97.9 F (36.6 C) (Oral)   Resp 18   SpO2 97%    Physical Exam Vitals and nursing note reviewed.  Constitutional:      Appearance: Normal appearance. He is obese.  HENT:     Head: Normocephalic and atraumatic.     Right Ear: Tympanic membrane, ear canal and external ear normal.     Left Ear: Tympanic membrane, ear canal and external ear normal.     Mouth/Throat:     Mouth: Mucous membranes are moist.     Pharynx: Oropharynx is clear.  Eyes:     Extraocular Movements: Extraocular movements intact.     Conjunctiva/sclera: Conjunctivae normal.     Pupils: Pupils are equal, round, and reactive to light.  Cardiovascular:     Rate and Rhythm: Normal rate and regular rhythm.     Pulses: Normal pulses.     Heart sounds: Normal heart sounds.  Pulmonary:     Effort: Pulmonary effort is normal.     Breath sounds: Normal breath sounds. No wheezing, rhonchi or rales.  Musculoskeletal:         General: Normal range of motion.     Cervical back: Normal range of motion and neck supple.  Skin:    General: Skin is warm and dry.  Neurological:  General: No focal deficit present.     Mental Status: He is alert and oriented to person, place, and time.  Psychiatric:        Mood and Affect: Mood normal.        Behavior: Behavior normal.        Thought Content: Thought content normal.      UC Treatments / Results  Labs (all labs ordered are listed, but only abnormal results are displayed) Labs Reviewed - No data to display  EKG   Radiology No results found.  Procedures Procedures (including critical care time)  Medications Ordered in UC Medications - No data to display  Initial Impression / Assessment and Plan / UC Course  I have reviewed the triage vital signs and the nursing notes.  Pertinent labs & imaging results that were available during my care of the patient were reviewed by me and considered in my medical decision making (see chart for details).     MDM: 1.  Nasal congestion-Rx'd prednisone 20 mg tablet: Take 3 tabs p.o. daily x 5 days. Advised patient to take medications as directed with food to completion.  Advised increase in daily water intake to 64 ounces per day while taking this medication.  Advised if symptoms worsen and/or unresolved please follow-up with your PCP or here for further evaluation.  Patient discharged home, hemodynamically stable.  Work note provided to patient per her request prior to discharge. Final Clinical Impressions(s) / UC Diagnoses   Final diagnoses:  Nasal congestion     Discharge Instructions      Advised patient to take medications as directed with food to completion.  Advised increase in daily water intake to 64 ounces per day while taking this medication.  Advised if symptoms worsen and/or unresolved please follow-up with your PCP or here for further evaluation.     ED Prescriptions     Medication Sig  Dispense Auth. Provider   predniSONE (DELTASONE) 20 MG tablet Take 3 tabs PO daily x 5 days. 15 tablet Trevor Iha, FNP      PDMP not reviewed this encounter.   Trevor Iha, FNP 07/16/23 1159

## 2023-07-16 NOTE — Discharge Instructions (Addendum)
 Advised patient to take medications as directed with food to completion.  Advised increase in daily water intake to 64 ounces per day while taking this medication.  Advised if symptoms worsen and/or unresolved please follow-up with your PCP or here for further evaluation.

## 2023-07-19 ENCOUNTER — Ambulatory Visit
Admission: EM | Admit: 2023-07-19 | Discharge: 2023-07-19 | Disposition: A | Discharge status: Home / Self Care | Attending: Family Medicine | Admitting: Family Medicine

## 2023-07-19 ENCOUNTER — Encounter: Payer: Self-pay | Admitting: Emergency Medicine

## 2023-07-19 DIAGNOSIS — J069 Acute upper respiratory infection, unspecified: Secondary | ICD-10-CM | POA: Diagnosis not present

## 2023-07-19 LAB — POCT INFLUENZA A/B
Influenza A, POC: NEGATIVE
Influenza B, POC: NEGATIVE

## 2023-07-19 LAB — POC SARS CORONAVIRUS 2 AG -  ED: SARS Coronavirus 2 Ag: NEGATIVE

## 2023-07-19 MED ORDER — AMOXICILLIN 875 MG PO TABS: 875.0000 mg | ORAL_TABLET | Freq: Two times a day (BID) | ORAL | 0 refills | Status: DC

## 2023-07-19 MED ORDER — FLUTICASONE PROPIONATE 50 MCG/ACT NA SUSP: 2.0000 | Freq: Every day | NASAL | 0 refills | Status: AC

## 2023-07-19 MED ORDER — PROMETHAZINE-DM 6.25-15 MG/5ML PO SYRP: 5.0000 mL | ORAL_SOLUTION | Freq: Four times a day (QID) | ORAL | 0 refills | Status: DC | PRN

## 2023-07-19 NOTE — Discharge Instructions (Addendum)
 I believe this infection is caused by a virus however, I am prescribing an antibiotic to see if it helps Take the amoxicillin 2 times a day until your symptoms have improved Use the Flonase daily, also until you see improvement I have prescribed a cough medicine to take at night.  This will help you sleep and may cause drowsiness Make sure you are drinking lots of fluids Return here or see your doctor if not improving by next week

## 2023-07-19 NOTE — ED Triage Notes (Signed)
 Patient c/o head and chest congestion, denies fever, some diarrhea x 4 days.  Informed that mother n law tested positive for COVID today.  Patient would like to be tested for COVID and Flu.

## 2023-07-19 NOTE — ED Provider Notes (Signed)
 [facility]    CSN: [id number] Arrival date & time: 07/19/23  1037      History   Chief Complaint Chief Complaint  Patient presents with   Congestion    HPI Christopher [name] is a 53 y.o. male.   Patient was seen 3 days ago for 1 day of hoarseness and sore throat.  He was given prednisone for his congestion.  He is back today with continued symptoms.  He has learned that a family member tested positive for COVID and he is here for flu and COVID testing.  No fever chills or body ache.  Sore throat and congestion are largely unchanged.  Patient states he is feeling.  Does have a cough.  Cough is keeping him awake at night.     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 07/02/2023   Low back pain, unspecified 06/18/2022   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/11/2021   GERD (gastroesophageal reflux disease) 08/02/2020   Right elbow pain 06/12/2020   Dysphagia 02/17/2020   Primary osteoarthritis of both hips 11/12/2018   Controlled type 2 diabetes mellitus without complication, without long-term current use of insulin (HCC) 06/15/2017   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   Hypertension associated with diabetes (HCC) 01/10/2017   Elevated hematocrit 11/24/2016   Hypogonadism in male 06/01/2016   Abnormal prostate specific antigen (PSA) 06/01/2016   Chronic post-traumatic stress disorder (PTSD) 05/30/2016   Nightmares associated with  chronic post-traumatic stress disorder 05/30/2016   Lumbar degenerative disc disease 02/05/2016   OSA on CPAP 12/02/2013   Family history of prostate cancer 12/02/2013   Hyperlipidemia associated with type 2 diabetes mellitus (HCC)     Past Surgical History:  Procedure Laterality Date   VASECTOMY  08/2017       Home Medications    Prior to Admission medications   Medication Sig Start Date End Date Taking? Authorizing Provider  AMBULATORY NON FORMULARY MEDICATION CPAP therapy on 17 cm H2O with a Wide size Resmed Nasal Mask Mirage FX mask and heated humidification. Use nightly for sleep.  Dx: Obstructive Sleep Apnea 06/30/18  Yes Carlis Stable, PA-C  amoxicillin (AMOXIL) 875 MG tablet Take 1 tablet (875 mg total) by mouth 2 (two) times daily. 07/19/23  Yes Eustace Moore, MD  aprepitant (EMEND) 40 MG capsule Take 40 mg by mouth 2 (two) times daily. 01/05/21  Yes [provider]  atorvastatin (LIPITOR) 20 MG tablet Take 1 tablet by mouth once daily 04/23/23  Yes Everrett Coombe, DO  celecoxib (CELEBREX) 100 MG capsule Take by mouth. 10/15/22  Yes [provider]  diclofenac Sodium (VOLTAREN) 1 % GEL Apply topically. 12/08/22  Yes [provider]  doxepin (SINEQUAN) 75 MG capsule Take 1 capsule (75 mg total) by mouth at bedtime. 07/02/23  Yes Everrett Coombe, DO  EPINEPHrine 0.3 mg/0.3 mL IJ SOAJ  injection Inject 0.3 mg into the muscle as needed for anaphylaxis. 07/02/23  Yes Everrett Coombe, DO  fluticasone (FLONASE) 50 MCG/ACT nasal spray Place 2 sprays into both nostrils daily. 07/19/23  Yes Eustace Moore, MD  pantoprazole (PROTONIX) 40 MG tablet Take 1 tablet (40 mg total) by mouth daily. 02/17/20  Yes Everrett Coombe, DO  prazosin (MINIPRESS) 1 MG capsule Take 1 capsule (1 mg total) by mouth at bedtime. 07/02/23  Yes Everrett Coombe, DO  promethazine-dextromethorphan (PROMETHAZINE-DM) 6.25-15 MG/5ML syrup Take 5 mLs by mouth 4 (four) times daily as  needed for cough. 07/19/23  Yes Eustace Moore, MD  traMADol (ULTRAM) 50 MG tablet Take 1 tablet (50 mg total) by mouth every 8 (eight) hours as needed for moderate pain (pain score 4-6). 07/02/23  Yes Ashley Royalty, [place], DO  WEGOVY 0.25 MG/0.5ML SOAJ Inject 0.25 mg into the skin once a week.   Yes [provider]    Family History Family History  Problem Relation Age of Onset   Healthy Mother    Prostate cancer Other    Colon cancer Neg Hx    Colon polyps Neg Hx    Esophageal cancer Neg Hx    Rectal cancer Neg Hx    Stomach cancer Neg Hx     Social History Social History   Tobacco Use   Smoking status: Never   Smokeless tobacco: Never  Vaping Use   Vaping status: Never Used  Substance Use Topics   Alcohol use: Yes    Comment: less than weekly   Drug use: No     Allergies   Patient has no known allergies.   Review of Systems Review of Systems  See HPI Physical Exam Triage Vital Signs ED Triage Vitals  Encounter Vitals Group     BP 07/19/23 1047 (!) 147/93     Systolic BP Percentile --      Diastolic BP Percentile --      Pulse Rate 07/19/23 1047 61     Resp 07/19/23 1047 18     Temp 07/19/23 1047 98.1 F (36.7 C)     Temp Source 07/19/23 1047 Oral     SpO2 07/19/23 1047 98 %     Weight 07/19/23 1049 220 lb (99.8 kg)     Height 07/19/23 1049 5\' 8"  (1.727 m)     Head Circumference --      Peak Flow --      Pain Score 07/19/23 1049 4     Pain Loc --      Pain Education --      Exclude from Growth Chart --    No data found.  Updated Vital Signs BP (!) 147/93 (BP Location: Right Arm)   Pulse 61   Temp 98.1 F (36.7 C) (Oral)   Resp 18   Ht 5\' 8"  (1.727 m)   Wt 99.8 kg   SpO2 98%   BMI 33.45 kg/m        Physical Exam Constitutional:      General: He is not in acute distress.    Appearance: He is well-developed. He is ill-appearing.  HENT:     Head: Normocephalic and atraumatic.     Right Ear: Tympanic membrane normal.     Left Ear:  Tympanic membrane normal.     Nose: Nose normal. No congestion.     Mouth/Throat:     Mouth: Mucous membranes are moist.     Pharynx: No posterior oropharyngeal erythema.  Eyes:  Conjunctiva/sclera: Conjunctivae normal.     Pupils: Pupils are equal, round, and reactive to light.  Cardiovascular:     Rate and Rhythm: Normal rate and regular rhythm.     Heart sounds: Normal heart sounds.  Pulmonary:     Effort: Pulmonary effort is normal. No respiratory distress.     Breath sounds: Rhonchi present.  Abdominal:     General: There is no distension.     Palpations: Abdomen is soft.  Musculoskeletal:        General: Normal range of motion.     Cervical back: Normal range of motion.  Lymphadenopathy:     Cervical: No cervical adenopathy.  Skin:    General: Skin is warm and dry.  Neurological:     Mental Status: He is alert.      UC Treatments / Results  Labs (all labs ordered are listed, but only abnormal results are displayed) Labs Reviewed  POCT INFLUENZA A/B - Normal  POC SARS CORONAVIRUS 2 AG -  ED    EKG   Radiology No results found.  Procedures Procedures (including critical care time)  Medications Ordered in UC Medications - No data to display  Initial Impression / Assessment and Plan / UC Course  I have reviewed the triage vital signs and the nursing notes.  Pertinent labs & imaging results that were available during my care of the patient were reviewed by me and considered in my medical decision making (see chart for details).     Final Clinical Impressions(s) / UC Diagnoses   Final diagnoses:  Viral URI with cough     Discharge Instructions      I believe this infection is caused by a virus however, I am prescribing an antibiotic to see if it helps Take the amoxicillin 2 times a day until your symptoms have improved Use the Flonase daily, also until you see improvement I have prescribed a cough medicine to take at night.  This will help you  sleep and may cause drowsiness Make sure you are drinking lots of fluids Return here or see your doctor if not improving by next week     ED Prescriptions     Medication Sig Dispense Auth. Provider   amoxicillin (AMOXIL) 875 MG tablet Take 1 tablet (875 mg total) by mouth 2 (two) times daily. 14 tablet Eustace Moore, MD   fluticasone [facility]) 50 MCG/ACT nasal spray Place 2 sprays into both nostrils daily. 16 g Eustace Moore, MD   promethazine-dextromethorphan (PROMETHAZINE-DM) 6.25-15 MG/5ML syrup Take 5 mLs by mouth 4 (four) times daily as needed for cough. 118 mL Eustace Moore, MD      PDMP not reviewed this encounter.   Eustace Moore, MD 07/19/23 1200

## 2023-07-20 ENCOUNTER — Other Ambulatory Visit: Payer: Self-pay | Admitting: Family Medicine

## 2023-12-30 ENCOUNTER — Ambulatory Visit: Payer: Self-pay | Admitting: Family Medicine

## 2024-01-06 ENCOUNTER — Encounter: Payer: Self-pay | Admitting: Sports Medicine

## 2024-01-14 ENCOUNTER — Ambulatory Visit: Admitting: Family Medicine

## 2024-01-14 ENCOUNTER — Other Ambulatory Visit: Payer: Self-pay | Admitting: Family Medicine

## 2024-01-14 ENCOUNTER — Encounter: Payer: Self-pay | Admitting: Family Medicine

## 2024-01-14 VITALS — BP 137/83 | HR 82 | Ht 68.0 in | Wt 230.0 lb

## 2024-01-14 DIAGNOSIS — E119 Type 2 diabetes mellitus without complications: Secondary | ICD-10-CM | POA: Diagnosis not present

## 2024-01-14 DIAGNOSIS — E1159 Type 2 diabetes mellitus with other circulatory complications: Secondary | ICD-10-CM | POA: Diagnosis not present

## 2024-01-14 DIAGNOSIS — E1169 Type 2 diabetes mellitus with other specified complication: Secondary | ICD-10-CM

## 2024-01-14 DIAGNOSIS — I152 Hypertension secondary to endocrine disorders: Secondary | ICD-10-CM | POA: Diagnosis not present

## 2024-01-14 DIAGNOSIS — Z23 Encounter for immunization: Secondary | ICD-10-CM | POA: Diagnosis not present

## 2024-01-14 DIAGNOSIS — F4312 Post-traumatic stress disorder, chronic: Secondary | ICD-10-CM

## 2024-01-14 DIAGNOSIS — E785 Hyperlipidemia, unspecified: Secondary | ICD-10-CM

## 2024-01-14 DIAGNOSIS — F515 Nightmare disorder: Secondary | ICD-10-CM

## 2024-01-14 DIAGNOSIS — Z903 Acquired absence of stomach [part of]: Secondary | ICD-10-CM

## 2024-01-14 MED ORDER — WEGOVY 1.7 MG/0.75ML ~~LOC~~ SOAJ: 1.7000 mg | SUBCUTANEOUS | 0 refills | Status: AC

## 2024-01-14 NOTE — Assessment & Plan Note (Signed)
 Blood sugars remaine well controlled.  Low carbohydrate diet encouraged.

## 2024-01-14 NOTE — Assessment & Plan Note (Signed)
Continue on Prazosin.  Seeing a therapist through the [place].

## 2024-01-14 NOTE — Assessment & Plan Note (Signed)
 Followed by [facility].  He has started to gain some weight back and is on Wegovy .  He has reached out to them for refill but has not heard back.  Providing courtesy refill so that he doesn't have to back down on strength due to missed doses.

## 2024-01-14 NOTE — Assessment & Plan Note (Signed)
 Continues to do well with combination of prazosin  and doxepin  for sleep.  Seeing behavioral health through the [facility] as well and Latuda was added. He will continue current medications.

## 2024-01-14 NOTE — Assessment & Plan Note (Signed)
BP remains well controlled.  Low sodium diet encouraged.

## 2024-01-14 NOTE — Assessment & Plan Note (Signed)
 Doing well with atorvastatin.  Continue at current strength.

## 2024-01-14 NOTE — Progress Notes (Signed)
 Christopher [name] - 53 y.o. male MRN [id number]  Date of birth: 09-09-1970  Subjective Chief Complaint  Patient presents with   Diabetes   Hypertension   Joint Pain    HPI Christopher [name] is a 53 y.o. male here today for follow upvisit.    He reports that he is doing well.   He was on several medications for HTN and diabetes prior to his weight loss surgery.  Able to come off of several of these.  He does remain on prazosin  which helps with PTSD and nightmares.  He was started on Wegovy  through [employer] to help with maintenance of weight loss as he has started to regain weight.  Currently at 1.7mg .  A1c thorough [facility] 3 weeks ago was 5.5%.   He is seeing psychiatry through the [place] as well.  Latuda was recently added. Remains on prazosin  and doxepin  as well to help with sleep. Overall stable at this time.   ROS:  A comprehensive ROS was completed and negative except as noted per HPI    No Known Allergies  Past Medical History:  Diagnosis Date   Depression    Diabetes mellitus without complication (HCC)    Hyperlipidemia    Hypertension    Hypogonadal obesity    Lumbar degenerative disc disease    Obesity    OSA (obstructive sleep apnea) 12/02/2013   CPAP therapy on 17 cm H2O with a Wide size Resmed Nasal Mask Mirage FX mask and heated humidification    Post traumatic stress disorder (PTSD)    Prediabetes 2018   Sleep apnea    cpap    Past Surgical History:  Procedure Laterality Date   VASECTOMY  08/2017    Social History   Socioeconomic History   Marital status: Single    Spouse name: Not on file   Number of children: Not on file   Years of education: Not on file   Highest education level: Not on file  Occupational History   Not on file  Tobacco Use   Smoking status: Never   Smokeless tobacco: Never  Vaping Use   Vaping status: Never Used  Substance and Sexual Activity   Alcohol use: Yes    Comment: less than weekly   Drug use: No   Sexual  activity: Yes    Partners: Female  Other Topics Concern   Not on file  Social History Narrative   ** Merged History Encounter **       Social Drivers of Health   Financial Resource Strain: Patient Declined (06/19/2023)   Received from [facility]   Overall Financial Resource Strain (CARDIA)    Difficulty of Paying Living Expenses: Patient declined  Food Insecurity: Patient Declined (06/19/2023)   Received from [facility]   Hunger Vital Sign    Within the past 12 months, you worried that your food would run out before you got the money to buy more.: Patient declined    Within the past 12 months, the food you bought just didn't last and you didn't have money to get more.: Patient declined  Transportation Needs: Patient Declined (06/19/2023)   Received from [facility] - Transportation    Lack of Transportation (Medical): Patient declined    Lack of Transportation (Non-Medical): Patient declined  Physical Activity: Sufficiently Active (01/14/2024)   Exercise Vital Sign    Days of Exercise per Week: 5 days    Minutes of Exercise per Session: 120 min  Stress:  No Stress Concern Present (01/14/2024)   [employer] of Occupational Health - Occupational Stress Questionnaire    Feeling of Stress: Not at all  Social Connections: Unknown (09/04/2021)   Received from [facility]   Social Network    Social Network: Not on file    Family History  Problem Relation Age of Onset   Healthy Mother    Prostate cancer Other    Colon cancer Neg Hx    Colon polyps Neg Hx    Esophageal cancer Neg Hx    Rectal cancer Neg Hx    Stomach cancer Neg Hx     Health Maintenance  Topic Date Due   HIV Screening  Never done   Hepatitis C Screening  Never done   Hepatitis B Vaccines 19-59 Average Risk (1 of 3 - 19+ 3-dose series) Never done   HEMOGLOBIN A1C  12/30/2023   OPHTHALMOLOGY EXAM  02/19/2024   Diabetic kidney evaluation - eGFR measurement  07/01/2024   Diabetic kidney  evaluation - Urine ACR  07/01/2024   Colonoscopy  12/16/2024   FOOT EXAM  01/13/2025   DTaP/Tdap/Td (5 - Td or Tdap) 10/16/2033   Pneumococcal Vaccine: 50+ Years  Completed   Influenza Vaccine  Completed   COVID-19 Vaccine  Completed   Zoster Vaccines- Shingrix  Completed   HPV VACCINES  Aged Out   Meningococcal B Vaccine  Aged Out     ----------------------------------------------------------------------------------------------------------------------------------------------------------------------------------------------------------------- Physical Exam BP 137/83   Pulse 82   Ht 5' 8 (1.727 m)   Wt 230 lb (104.3 kg)   SpO2 97%   BMI 34.97 kg/m   Physical Exam Constitutional:      Appearance: Normal appearance.  HENT:     Head: Normocephalic and atraumatic.  Cardiovascular:     Rate and Rhythm: Normal rate and regular rhythm.  Pulmonary:     Effort: Pulmonary effort is normal.     Breath sounds: Normal breath sounds.  Neurological:     General: No focal deficit present.     Mental Status: He is alert.  Psychiatric:        Mood and Affect: Mood normal.        Behavior: Behavior normal.     ------------------------------------------------------------------------------------------------------------------------------------------------------------------------------------------------------------------- Assessment and Plan  Hyperlipidemia associated with type 2 diabetes mellitus (HCC) Doing well with atorvastatin .  Continue at current strength.   Chronic post-traumatic stress disorder (PTSD) Continues to do well with combination of prazosin  and doxepin  for sleep.  Seeing behavioral health through the [place] as well and Latuda was added. He will continue current medications.   Nightmares associated with chronic post-traumatic stress disorder Continue on Prazosin .  Seeing a therapist through the [place].   Controlled type 2 diabetes mellitus without complication, without  long-term current use of insulin (HCC) Blood sugars remaine well controlled.  Low carbohydrate diet encouraged.   Status post sleeve gastrectomy Followed by [facility].  He has started to gain some weight back and is on Wegovy .  He has reached out to them for refill but has not heard back.  Providing courtesy refill so that he doesn't have to back down on strength due to missed doses.   Hypertension associated with diabetes (HCC) BP remains well controlled.  Low sodium diet encouraged.    Meds ordered this encounter  Medications   semaglutide -weight management (WEGOVY ) 1.7 MG/0.75ML SOAJ SQ injection    Sig: Inject 1.7 mg into the skin once a week.    Dispense:  3 mL  Refill:  0    Return in about 6 months (around 07/13/2024) for Type 2 Diabetes, Hypertension.

## 2024-04-08 ENCOUNTER — Ambulatory Visit: Payer: Self-pay

## 2024-04-08 NOTE — Telephone Encounter (Signed)
 FYI Only or Action Required?: Action required by provider: recommended ED to patient but he is out of town-wants to wait until he gets home. Will be home on Saturday-verbalized that he would go on Saturday.  Patient was last seen in primary care on 01/14/2024 by Alvia Bring, DO.  Called Nurse Triage reporting Skin Problem.  Symptoms began Thanksgiving.  Interventions attempted: Prescription medications: Bactrim and Rest, hydration, or home remedies.  Symptoms are: unchanged.  Triage Disposition: See Physician Within 24 Hours  Patient/caregiver understands and will follow disposition?: No, wishes to speak with PCP  Copied from CRM #[id number]. Topic: Clinical - Red Word Triage >> Apr 08, 2024 11:29 AM Kevelyn M wrote: Red Word that prompted transfer to Nurse Triage: Spider bite on the back of his head. Went to urgent care and they lanced it. Now the abscess is draining. He's still having headaches off and on. Reason for Disposition  [1] Red or very tender (to touch) area AND [2] started over 24 hours after the bite  Answer Assessment - Initial Assessment Questions States he went to a [facility] care for a large area of swelling to his head located above his neck. 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 Provider at urgent care attempt to I&D but no success. Provider stated she was unsure of how deep the area is. Patient described the area to be the size of a tangerine.  Patient was placed on antibiotics. Patient reports brown drainage from the site after stating I made the area bigger. Denies fever, SOB, CP. Patient is currently out of town. Patient is recommended to the ED-patient not wanting to go to an unfamiliar ED. Patient educated on any worsening signs and symptoms. Patient should be back in the area on Saturday. Did recommend patient go to the ED on Saturday when he gets home. Would like a call from provider with another other recommendations.   1. TYPE of  INSECT: What type of insect was it?      Unsure of what type of insect 2. ONSET: When did you get bitten?      Over Thanksgiving 3. LOCATION: Where is the insect bite located?      Back of head close to neck 4. REDNESS: Is the area red or pink? If Yes, ask: What size is the area of redness? (inches or cm). When did the redness start?     Some redness 5. PAIN: Is there any pain? If Yes, ask: How bad is the pain? (Scale 0-10; or none, mild, moderate, severe)     Mild pain currently 6. ITCHING: Does it itch? If Yes, ask: How bad is the itch?      no 7. SWELLING: How big is the swelling? (e.g., inches, cm, or compare to coins)     Patient states size of a small tangerine. 8. OTHER SYMPTOMS: Do you have any other symptoms?  (e.g., difficulty breathing, fever, hives)     No other symptoms.  Protocols used: Insect Bite-A-AH

## 2024-04-15 ENCOUNTER — Encounter: Payer: Self-pay | Admitting: Family Medicine

## 2024-04-15 ENCOUNTER — Ambulatory Visit: Admitting: Family Medicine

## 2024-04-15 VITALS — BP 135/76 | HR 66 | Ht 68.0 in | Wt 238.0 lb

## 2024-04-15 DIAGNOSIS — L0291 Cutaneous abscess, unspecified: Secondary | ICD-10-CM | POA: Insufficient documentation

## 2024-04-15 NOTE — Assessment & Plan Note (Signed)
 Appears to be healing well.  Complete doxycycline.  Possible underlying cyst and he will plan to follow up with general surgery as well.

## 2024-04-15 NOTE — Progress Notes (Signed)
 Christopher [name] - 53 y.o. male MRN [id number]  Date of birth: [birth year]  Subjective Chief Complaint  Patient presents with   Insect Bite    HPI Christopher [name] is a 53 y.o. male here today for follow up of abscess.  Seen in ED and had I&D with packing.  Also started on doxycycline.  Reports that area has improved.  He has removed all packing at this point.  He remains on doxycycline.  Denies fever or chills . No continued drainage from area.  He was referred to general surgeon as well and plans to follow up with them.   ROS:  A comprehensive ROS was completed and negative except as noted per HPI  Allergies[1]  Past Medical History:  Diagnosis Date   Depression    Diabetes mellitus without complication (HCC)    Hyperlipidemia    Hypertension    Hypogonadal obesity    Lumbar degenerative disc disease    Obesity    OSA (obstructive sleep apnea) 12/02/2013   CPAP therapy on 17 cm H2O with a Wide size Resmed Nasal Mask Mirage FX mask and heated humidification    Post traumatic stress disorder (PTSD)    Prediabetes 2018   Sleep apnea    cpap    Past Surgical History:  Procedure Laterality Date   VASECTOMY  08/2017    Social History   Socioeconomic History   Marital status: Single    Spouse name: Not on file   Number of children: Not on file   Years of education: Not on file   Highest education level: Not on file  Occupational History   Not on file  Tobacco Use   Smoking status: Never   Smokeless tobacco: Never  Vaping Use   Vaping status: Never Used  Substance and Sexual Activity   Alcohol use: Yes    Comment: less than weekly   Drug use: No   Sexual activity: Yes    Partners: Female  Other Topics Concern   Not on file  Social History Narrative   ** Merged History Encounter **       Social Drivers of Health   Tobacco Use: Low Risk (04/15/2024)   Patient History    Smoking Tobacco Use: Never    Smokeless Tobacco Use: Never    Passive Exposure: Not on file   Financial Resource Strain: Patient Declined (06/19/2023)   Received from [facility]   Overall Financial Resource Strain (CARDIA)    Difficulty of Paying Living Expenses: Patient declined  Food Insecurity: Patient Declined (06/19/2023)   Received from [facility]   Epic    Within the past 12 months, you worried that your food would run out before you got the money to buy more.: Patient declined    Within the past 12 months, the food you bought just didn't last and you didn't have money to get more.: Patient declined  Transportation Needs: Patient Declined (06/19/2023)   Received from [facility] - Transportation    Lack of Transportation (Medical): Patient declined    Lack of Transportation (Non-Medical): Patient declined  Physical Activity: Sufficiently Active (01/14/2024)   Exercise Vital Sign    Days of Exercise per Week: 5 days    Minutes of Exercise per Session: 120 min  Stress: No Stress Concern Present (01/14/2024)   [employer] of Occupational Health - Occupational Stress Questionnaire    Feeling of Stress: Not at all  Social Connections: Moderately Integrated (04/15/2024)  Social Connection and Isolation Panel    Frequency of Communication with Friends and Family: Three times a week    Frequency of Social Gatherings with Friends and Family: Once a week    Attends Religious Services: 1 to 4 times per year    Active Member of Clubs or Organizations: No    Attends Banker Meetings: Never    Marital Status: Married  Depression (PHQ2-9): Low Risk (04/15/2024)   Depression (PHQ2-9)    PHQ-2 Score: 0  Alcohol Screen: Low Risk (01/14/2024)   Alcohol Screen    Last Alcohol Screening Score (AUDIT): 0  Housing: Patient Declined (06/19/2023)   Received [facility]    In the last 12 months, was there a time when you were not able to pay the mortgage or rent on time?: Patient declined    In the past 12 months, how many times have you  moved where you were living?: 1    At any time in the past 12 months, were you homeless or living in a shelter (including now)?: Patient declined  Utilities: Patient Declined (06/19/2023)   Received from [facility] Utilities    Threatened with loss of utilities: Patient declined  Health Literacy: Adequate Health Literacy (01/14/2024)   B1300 Health Literacy    Frequency of need for help with medical instructions: Never    Family History  Problem Relation Age of Onset   Healthy Mother    Prostate cancer Other    Colon cancer Neg Hx    Colon polyps Neg Hx    Esophageal cancer Neg Hx    Rectal cancer Neg Hx    Stomach cancer Neg Hx     Health Maintenance  Topic Date Due   HIV Screening  Never done   Hepatitis C Screening  Never done   Hepatitis B Vaccines 19-59 Average Risk (1 of 3 - 19+ 3-dose series) Never done   HEMOGLOBIN A1C  12/30/2023   OPHTHALMOLOGY EXAM  02/19/2024   COVID-19 Vaccine (7 - 2025-26 season) 05/01/2025 (Originally 01/05/2024)   Diabetic kidney evaluation - eGFR measurement  07/01/2024   Diabetic kidney evaluation - Urine ACR  07/01/2024   Colonoscopy  12/16/2024   FOOT EXAM  01/13/2025   DTaP/Tdap/Td (5 - Td or Tdap) 10/16/2033   Pneumococcal Vaccine: 50+ Years  Completed   Influenza Vaccine  Completed   Zoster Vaccines- Shingrix  Completed   HPV VACCINES  Aged Out   Meningococcal B Vaccine  Aged Out     ----------------------------------------------------------------------------------------------------------------------------------------------------------------------------------------------------------------- Physical Exam BP 135/76   Pulse 66   Ht 5' 8 (1.727 m)   Wt 238 lb (108 kg)   SpO2 98%   BMI 36.19 kg/m   Physical Exam Constitutional:      Appearance: Normal appearance.  Skin:    Comments: Continued induration without fluctuance or tenderness. No drainage.    Neurological:     Mental Status: He is alert.      ------------------------------------------------------------------------------------------------------------------------------------------------------------------------------------------------------------------- Assessment and Plan  Abscess Appears to be healing well.  Complete doxycycline.  Possible underlying cyst and he will plan to follow up with general surgery as well.    No orders of the defined types were placed in this encounter.   No follow-ups on file.        [1] No Known Allergies

## 2024-04-15 NOTE — Patient Instructions (Signed)
 Complete doxycycline.  Let me know if worsening again.    Skin Abscess  A skin abscess is an infected spot of skin. It can have pus in it. An abscess can happen in any part of your body. Some abscesses break open (rupture) on their own. Most keep getting worse unless they are treated. If your abscess is not treated, the infection can spread deeper into your body and blood. This can make you feel sick. What are the causes? Germs that enter your skin. This may happen if you have: A cut or scrape. A wound from a needle or an insect bite. Blocked oil or sweat glands. A problem with the spot where your hair goes into your skin. A fluid-filled sac called a cyst under your skin. What increases the risk? Having problems with how your blood moves through your body. Having a weak body defense system (immune system). Having diabetes. Having dry and irritated skin. Needing to get shots often. Putting drugs into your body with a needle. Having a splinter or something else in your skin. Smoking. What are the signs or symptoms? A firm bump under your skin that hurts. A bump with pus at the top. Redness and swelling. Warm or tender spots. A sore on the skin. How is this treated? You may need to: Put a heat pack or a warm, wet washcloth on the spot. Have the pus drained. Take antibiotics. Follow these instructions at home: Medicines Take over-the-counter and prescription medicines only as told by your doctor. If you were prescribed antibiotics, take them as told by your doctor. Do not stop taking them even if you start to feel better. Abscess care  If you have an abscess that has not drained, put heat on it. Use the heat source that your doctor recommends, such as a moist heat pack or a heating pad. Place a towel between your skin and the heat source. Leave the heat on for 20-30 minutes. If your skin turns bright red, take off the heat right away to prevent burns. The risk of burns is  higher if you cannot feel pain, heat, or cold. Follow instructions from your doctor about how to take care of your abscess. Make sure you: Cover the abscess with a bandage. Wash your hands with soap and water for at least 20 seconds before and after you change your bandage. If you cannot use soap and water, use hand sanitizer. Change your bandage as told by your doctor. Check your abscess every day for signs that the infection is getting worse. Check for: More redness, swelling, or pain. More fluid or blood. Warmth. More pus or a worse smell. General instructions To keep the infection from spreading: Do not share personal items or towels. Do not go in a hot tub with others. Avoid making skin contact with others. Be careful when you get rid of used bandages or any pus from the abscess. Do not smoke or use any products that contain nicotine or tobacco. If you need help quitting, ask your doctor. Contact a doctor if: You see red streaks on your skin near the abscess. You have any signs of worse infection. You vomit every time you eat or drink. You have a fever, chills, or muscle aches. The cyst or abscess comes back. Get help right away if: You have very bad pain. You make less pee (urine) than normal. This information is not intended to replace advice given to you by your health care provider. Make sure you discuss  any questions you have with your health care provider. Document Revised: 12/05/2021 Document Reviewed: 12/05/2021 Elsevier Patient Education  2024 [employer].

## 2024-07-13 ENCOUNTER — Ambulatory Visit: Admitting: Family Medicine

## 2024-07-20 ENCOUNTER — Ambulatory Visit: Admitting: Family Medicine
